# Patient Record
Sex: Female | Born: 1935
Health system: Southern US, Community
[De-identification: ages and names within clinical notes are randomized; demographics above are authoritative.]

## PROBLEM LIST (undated history)

## (undated) DIAGNOSIS — I4891 Unspecified atrial fibrillation: Secondary | ICD-10-CM

## (undated) DIAGNOSIS — F0390 Unspecified dementia without behavioral disturbance: Secondary | ICD-10-CM

## (undated) DIAGNOSIS — I639 Cerebral infarction, unspecified: Secondary | ICD-10-CM

## (undated) DIAGNOSIS — I10 Essential (primary) hypertension: Secondary | ICD-10-CM

## (undated) DIAGNOSIS — C801 Malignant (primary) neoplasm, unspecified: Secondary | ICD-10-CM

## (undated) DIAGNOSIS — E119 Type 2 diabetes mellitus without complications: Secondary | ICD-10-CM

## (undated) DIAGNOSIS — E785 Hyperlipidemia, unspecified: Secondary | ICD-10-CM

## (undated) HISTORY — DX: Malignant (primary) neoplasm, unspecified: C80.1

## (undated) HISTORY — PX: KNEE SURGERY: SHX244

## (undated) HISTORY — PX: BLADDER SURGERY: SHX569

## (undated) HISTORY — DX: Cerebral infarction, unspecified: I63.9

## (undated) HISTORY — DX: Type 2 diabetes mellitus without complications: E11.9

## (undated) HISTORY — PX: CATARACT EXTRACTION: SUR2

## (undated) HISTORY — DX: Unspecified dementia, unspecified severity, without behavioral disturbance, psychotic disturbance, mood disturbance, and anxiety: F03.90

## (undated) HISTORY — PX: OOPHORECTOMY: SHX86

## (undated) HISTORY — PX: SHOULDER SURGERY: SHX246

## (undated) HISTORY — DX: Unspecified atrial fibrillation: I48.91

## (undated) HISTORY — PX: ABDOMINAL HYSTERECTOMY: SHX81

## (undated) HISTORY — PX: ILEOCECETOMY: SHX5857

## (undated) HISTORY — DX: Hyperlipidemia, unspecified: E78.5

## (undated) HISTORY — PX: WRIST SURGERY: SHX841

## (undated) HISTORY — DX: Essential (primary) hypertension: I10

---

## 2006-05-17 ENCOUNTER — Encounter: Admission: RE | Admit: 2006-05-17 | Discharge: 2006-05-17 | Payer: Self-pay | Admitting: Family Medicine

## 2011-04-30 DIAGNOSIS — H532 Diplopia: Secondary | ICD-10-CM | POA: Diagnosis not present

## 2011-05-01 DIAGNOSIS — I079 Rheumatic tricuspid valve disease, unspecified: Secondary | ICD-10-CM | POA: Diagnosis not present

## 2011-05-01 DIAGNOSIS — I059 Rheumatic mitral valve disease, unspecified: Secondary | ICD-10-CM | POA: Diagnosis not present

## 2011-05-01 DIAGNOSIS — I6529 Occlusion and stenosis of unspecified carotid artery: Secondary | ICD-10-CM | POA: Diagnosis not present

## 2011-05-01 DIAGNOSIS — I658 Occlusion and stenosis of other precerebral arteries: Secondary | ICD-10-CM | POA: Diagnosis not present

## 2011-05-01 DIAGNOSIS — I359 Nonrheumatic aortic valve disorder, unspecified: Secondary | ICD-10-CM | POA: Diagnosis not present

## 2011-05-01 DIAGNOSIS — I4891 Unspecified atrial fibrillation: Secondary | ICD-10-CM | POA: Diagnosis not present

## 2011-05-01 DIAGNOSIS — I6789 Other cerebrovascular disease: Secondary | ICD-10-CM | POA: Diagnosis not present

## 2011-05-04 DIAGNOSIS — H532 Diplopia: Secondary | ICD-10-CM | POA: Diagnosis not present

## 2011-05-07 DIAGNOSIS — I6789 Other cerebrovascular disease: Secondary | ICD-10-CM | POA: Diagnosis not present

## 2011-05-07 DIAGNOSIS — H532 Diplopia: Secondary | ICD-10-CM | POA: Diagnosis not present

## 2011-05-18 DIAGNOSIS — Z932 Ileostomy status: Secondary | ICD-10-CM | POA: Diagnosis not present

## 2011-05-18 DIAGNOSIS — G8190 Hemiplegia, unspecified affecting unspecified side: Secondary | ICD-10-CM | POA: Diagnosis not present

## 2011-05-18 DIAGNOSIS — E785 Hyperlipidemia, unspecified: Secondary | ICD-10-CM | POA: Diagnosis present

## 2011-05-18 DIAGNOSIS — Z7982 Long term (current) use of aspirin: Secondary | ICD-10-CM | POA: Diagnosis not present

## 2011-05-18 DIAGNOSIS — R4182 Altered mental status, unspecified: Secondary | ICD-10-CM | POA: Diagnosis not present

## 2011-05-18 DIAGNOSIS — R7301 Impaired fasting glucose: Secondary | ICD-10-CM | POA: Diagnosis not present

## 2011-05-18 DIAGNOSIS — I69991 Dysphagia following unspecified cerebrovascular disease: Secondary | ICD-10-CM | POA: Diagnosis not present

## 2011-05-18 DIAGNOSIS — R471 Dysarthria and anarthria: Secondary | ICD-10-CM | POA: Diagnosis present

## 2011-05-18 DIAGNOSIS — Z794 Long term (current) use of insulin: Secondary | ICD-10-CM | POA: Diagnosis not present

## 2011-05-18 DIAGNOSIS — E860 Dehydration: Secondary | ICD-10-CM | POA: Diagnosis not present

## 2011-05-18 DIAGNOSIS — K219 Gastro-esophageal reflux disease without esophagitis: Secondary | ICD-10-CM | POA: Diagnosis present

## 2011-05-18 DIAGNOSIS — F329 Major depressive disorder, single episode, unspecified: Secondary | ICD-10-CM | POA: Diagnosis present

## 2011-05-18 DIAGNOSIS — E1149 Type 2 diabetes mellitus with other diabetic neurological complication: Secondary | ICD-10-CM | POA: Diagnosis not present

## 2011-05-18 DIAGNOSIS — I6340 Cerebral infarction due to embolism of unspecified cerebral artery: Secondary | ICD-10-CM | POA: Diagnosis not present

## 2011-05-18 DIAGNOSIS — N390 Urinary tract infection, site not specified: Secondary | ICD-10-CM | POA: Diagnosis not present

## 2011-05-18 DIAGNOSIS — I6350 Cerebral infarction due to unspecified occlusion or stenosis of unspecified cerebral artery: Secondary | ICD-10-CM | POA: Diagnosis not present

## 2011-05-18 DIAGNOSIS — K5190 Ulcerative colitis, unspecified, without complications: Secondary | ICD-10-CM | POA: Diagnosis not present

## 2011-05-18 DIAGNOSIS — I369 Nonrheumatic tricuspid valve disorder, unspecified: Secondary | ICD-10-CM | POA: Diagnosis not present

## 2011-05-18 DIAGNOSIS — R627 Adult failure to thrive: Secondary | ICD-10-CM | POA: Diagnosis not present

## 2011-05-18 DIAGNOSIS — Z8673 Personal history of transient ischemic attack (TIA), and cerebral infarction without residual deficits: Secondary | ICD-10-CM | POA: Diagnosis not present

## 2011-05-18 DIAGNOSIS — R1310 Dysphagia, unspecified: Secondary | ICD-10-CM | POA: Diagnosis not present

## 2011-05-18 DIAGNOSIS — R5381 Other malaise: Secondary | ICD-10-CM | POA: Diagnosis not present

## 2011-05-18 DIAGNOSIS — I69993 Ataxia following unspecified cerebrovascular disease: Secondary | ICD-10-CM | POA: Diagnosis not present

## 2011-05-18 DIAGNOSIS — G459 Transient cerebral ischemic attack, unspecified: Secondary | ICD-10-CM | POA: Diagnosis not present

## 2011-05-18 DIAGNOSIS — R1312 Dysphagia, oropharyngeal phase: Secondary | ICD-10-CM | POA: Diagnosis not present

## 2011-05-18 DIAGNOSIS — R633 Feeding difficulties, unspecified: Secondary | ICD-10-CM | POA: Diagnosis not present

## 2011-05-18 DIAGNOSIS — F3289 Other specified depressive episodes: Secondary | ICD-10-CM | POA: Diagnosis not present

## 2011-05-18 DIAGNOSIS — I4891 Unspecified atrial fibrillation: Secondary | ICD-10-CM | POA: Diagnosis not present

## 2011-05-18 DIAGNOSIS — I69922 Dysarthria following unspecified cerebrovascular disease: Secondary | ICD-10-CM | POA: Diagnosis not present

## 2011-05-18 DIAGNOSIS — R279 Unspecified lack of coordination: Secondary | ICD-10-CM | POA: Diagnosis present

## 2011-05-18 DIAGNOSIS — I10 Essential (primary) hypertension: Secondary | ICD-10-CM | POA: Diagnosis not present

## 2011-05-18 DIAGNOSIS — E119 Type 2 diabetes mellitus without complications: Secondary | ICD-10-CM | POA: Diagnosis not present

## 2011-05-18 DIAGNOSIS — R269 Unspecified abnormalities of gait and mobility: Secondary | ICD-10-CM | POA: Diagnosis not present

## 2011-05-18 DIAGNOSIS — IMO0001 Reserved for inherently not codable concepts without codable children: Secondary | ICD-10-CM | POA: Diagnosis not present

## 2011-05-18 DIAGNOSIS — E1159 Type 2 diabetes mellitus with other circulatory complications: Secondary | ICD-10-CM | POA: Diagnosis not present

## 2011-05-27 DIAGNOSIS — I4891 Unspecified atrial fibrillation: Secondary | ICD-10-CM | POA: Diagnosis not present

## 2011-05-27 DIAGNOSIS — I6340 Cerebral infarction due to embolism of unspecified cerebral artery: Secondary | ICD-10-CM | POA: Diagnosis not present

## 2011-05-27 DIAGNOSIS — R269 Unspecified abnormalities of gait and mobility: Secondary | ICD-10-CM | POA: Diagnosis not present

## 2011-05-27 DIAGNOSIS — R1310 Dysphagia, unspecified: Secondary | ICD-10-CM | POA: Diagnosis not present

## 2011-05-27 DIAGNOSIS — G8190 Hemiplegia, unspecified affecting unspecified side: Secondary | ICD-10-CM | POA: Diagnosis not present

## 2011-05-27 DIAGNOSIS — R5381 Other malaise: Secondary | ICD-10-CM | POA: Diagnosis not present

## 2011-05-27 DIAGNOSIS — R279 Unspecified lack of coordination: Secondary | ICD-10-CM | POA: Diagnosis not present

## 2011-05-27 DIAGNOSIS — I69922 Dysarthria following unspecified cerebrovascular disease: Secondary | ICD-10-CM | POA: Diagnosis not present

## 2011-05-27 DIAGNOSIS — K5190 Ulcerative colitis, unspecified, without complications: Secondary | ICD-10-CM | POA: Diagnosis not present

## 2011-05-27 DIAGNOSIS — E119 Type 2 diabetes mellitus without complications: Secondary | ICD-10-CM | POA: Diagnosis not present

## 2011-05-27 DIAGNOSIS — E785 Hyperlipidemia, unspecified: Secondary | ICD-10-CM | POA: Diagnosis not present

## 2011-05-27 DIAGNOSIS — I6350 Cerebral infarction due to unspecified occlusion or stenosis of unspecified cerebral artery: Secondary | ICD-10-CM | POA: Diagnosis not present

## 2011-05-27 DIAGNOSIS — G459 Transient cerebral ischemic attack, unspecified: Secondary | ICD-10-CM | POA: Diagnosis not present

## 2011-05-27 DIAGNOSIS — E1159 Type 2 diabetes mellitus with other circulatory complications: Secondary | ICD-10-CM | POA: Diagnosis not present

## 2011-05-27 DIAGNOSIS — K219 Gastro-esophageal reflux disease without esophagitis: Secondary | ICD-10-CM | POA: Diagnosis not present

## 2011-05-27 DIAGNOSIS — I69993 Ataxia following unspecified cerebrovascular disease: Secondary | ICD-10-CM | POA: Diagnosis not present

## 2011-05-27 DIAGNOSIS — I69991 Dysphagia following unspecified cerebrovascular disease: Secondary | ICD-10-CM | POA: Diagnosis not present

## 2011-05-29 DIAGNOSIS — E1159 Type 2 diabetes mellitus with other circulatory complications: Secondary | ICD-10-CM | POA: Diagnosis not present

## 2011-05-29 DIAGNOSIS — I4891 Unspecified atrial fibrillation: Secondary | ICD-10-CM | POA: Diagnosis not present

## 2011-05-29 DIAGNOSIS — I6340 Cerebral infarction due to embolism of unspecified cerebral artery: Secondary | ICD-10-CM | POA: Diagnosis not present

## 2011-05-29 DIAGNOSIS — E785 Hyperlipidemia, unspecified: Secondary | ICD-10-CM | POA: Diagnosis not present

## 2011-07-09 DIAGNOSIS — R279 Unspecified lack of coordination: Secondary | ICD-10-CM | POA: Diagnosis not present

## 2011-07-09 DIAGNOSIS — Z8673 Personal history of transient ischemic attack (TIA), and cerebral infarction without residual deficits: Secondary | ICD-10-CM | POA: Diagnosis not present

## 2011-07-09 DIAGNOSIS — E119 Type 2 diabetes mellitus without complications: Secondary | ICD-10-CM | POA: Diagnosis not present

## 2011-07-09 DIAGNOSIS — Z932 Ileostomy status: Secondary | ICD-10-CM | POA: Diagnosis not present

## 2011-07-09 DIAGNOSIS — I4891 Unspecified atrial fibrillation: Secondary | ICD-10-CM | POA: Diagnosis not present

## 2011-07-09 DIAGNOSIS — I69959 Hemiplegia and hemiparesis following unspecified cerebrovascular disease affecting unspecified side: Secondary | ICD-10-CM | POA: Diagnosis not present

## 2011-07-13 DIAGNOSIS — R633 Feeding difficulties, unspecified: Secondary | ICD-10-CM | POA: Diagnosis not present

## 2011-07-14 DIAGNOSIS — Z8673 Personal history of transient ischemic attack (TIA), and cerebral infarction without residual deficits: Secondary | ICD-10-CM | POA: Diagnosis not present

## 2011-07-14 DIAGNOSIS — E119 Type 2 diabetes mellitus without complications: Secondary | ICD-10-CM | POA: Diagnosis not present

## 2011-07-14 DIAGNOSIS — Z932 Ileostomy status: Secondary | ICD-10-CM | POA: Diagnosis not present

## 2011-07-14 DIAGNOSIS — R279 Unspecified lack of coordination: Secondary | ICD-10-CM | POA: Diagnosis not present

## 2011-07-14 DIAGNOSIS — I4891 Unspecified atrial fibrillation: Secondary | ICD-10-CM | POA: Diagnosis not present

## 2011-07-14 DIAGNOSIS — I69959 Hemiplegia and hemiparesis following unspecified cerebrovascular disease affecting unspecified side: Secondary | ICD-10-CM | POA: Diagnosis not present

## 2011-07-15 DIAGNOSIS — E119 Type 2 diabetes mellitus without complications: Secondary | ICD-10-CM | POA: Diagnosis not present

## 2011-07-15 DIAGNOSIS — I69959 Hemiplegia and hemiparesis following unspecified cerebrovascular disease affecting unspecified side: Secondary | ICD-10-CM | POA: Diagnosis not present

## 2011-07-15 DIAGNOSIS — Z8673 Personal history of transient ischemic attack (TIA), and cerebral infarction without residual deficits: Secondary | ICD-10-CM | POA: Diagnosis not present

## 2011-07-15 DIAGNOSIS — Z932 Ileostomy status: Secondary | ICD-10-CM | POA: Diagnosis not present

## 2011-07-15 DIAGNOSIS — I4891 Unspecified atrial fibrillation: Secondary | ICD-10-CM | POA: Diagnosis not present

## 2011-07-15 DIAGNOSIS — R279 Unspecified lack of coordination: Secondary | ICD-10-CM | POA: Diagnosis not present

## 2011-07-20 DIAGNOSIS — I6350 Cerebral infarction due to unspecified occlusion or stenosis of unspecified cerebral artery: Secondary | ICD-10-CM | POA: Diagnosis not present

## 2011-07-20 DIAGNOSIS — Z79899 Other long term (current) drug therapy: Secondary | ICD-10-CM | POA: Diagnosis not present

## 2011-07-20 DIAGNOSIS — I10 Essential (primary) hypertension: Secondary | ICD-10-CM | POA: Diagnosis not present

## 2011-07-20 DIAGNOSIS — I4891 Unspecified atrial fibrillation: Secondary | ICD-10-CM | POA: Diagnosis not present

## 2011-07-20 DIAGNOSIS — E119 Type 2 diabetes mellitus without complications: Secondary | ICD-10-CM | POA: Diagnosis not present

## 2011-07-21 DIAGNOSIS — I69959 Hemiplegia and hemiparesis following unspecified cerebrovascular disease affecting unspecified side: Secondary | ICD-10-CM | POA: Diagnosis not present

## 2011-07-21 DIAGNOSIS — Z932 Ileostomy status: Secondary | ICD-10-CM | POA: Diagnosis not present

## 2011-07-21 DIAGNOSIS — R279 Unspecified lack of coordination: Secondary | ICD-10-CM | POA: Diagnosis not present

## 2011-07-21 DIAGNOSIS — Z8673 Personal history of transient ischemic attack (TIA), and cerebral infarction without residual deficits: Secondary | ICD-10-CM | POA: Diagnosis not present

## 2011-07-21 DIAGNOSIS — E119 Type 2 diabetes mellitus without complications: Secondary | ICD-10-CM | POA: Diagnosis not present

## 2011-07-21 DIAGNOSIS — I4891 Unspecified atrial fibrillation: Secondary | ICD-10-CM | POA: Diagnosis not present

## 2011-07-22 DIAGNOSIS — I4891 Unspecified atrial fibrillation: Secondary | ICD-10-CM | POA: Diagnosis not present

## 2011-07-22 DIAGNOSIS — E119 Type 2 diabetes mellitus without complications: Secondary | ICD-10-CM | POA: Diagnosis not present

## 2011-07-22 DIAGNOSIS — I69959 Hemiplegia and hemiparesis following unspecified cerebrovascular disease affecting unspecified side: Secondary | ICD-10-CM | POA: Diagnosis not present

## 2011-07-22 DIAGNOSIS — Z932 Ileostomy status: Secondary | ICD-10-CM | POA: Diagnosis not present

## 2011-07-22 DIAGNOSIS — R279 Unspecified lack of coordination: Secondary | ICD-10-CM | POA: Diagnosis not present

## 2011-07-22 DIAGNOSIS — Z8673 Personal history of transient ischemic attack (TIA), and cerebral infarction without residual deficits: Secondary | ICD-10-CM | POA: Diagnosis not present

## 2011-07-23 DIAGNOSIS — E119 Type 2 diabetes mellitus without complications: Secondary | ICD-10-CM | POA: Diagnosis not present

## 2011-07-23 DIAGNOSIS — Z8673 Personal history of transient ischemic attack (TIA), and cerebral infarction without residual deficits: Secondary | ICD-10-CM | POA: Diagnosis not present

## 2011-07-23 DIAGNOSIS — I69959 Hemiplegia and hemiparesis following unspecified cerebrovascular disease affecting unspecified side: Secondary | ICD-10-CM | POA: Diagnosis not present

## 2011-07-23 DIAGNOSIS — I4891 Unspecified atrial fibrillation: Secondary | ICD-10-CM | POA: Diagnosis not present

## 2011-07-23 DIAGNOSIS — Z932 Ileostomy status: Secondary | ICD-10-CM | POA: Diagnosis not present

## 2011-07-23 DIAGNOSIS — R279 Unspecified lack of coordination: Secondary | ICD-10-CM | POA: Diagnosis not present

## 2011-07-27 DIAGNOSIS — R279 Unspecified lack of coordination: Secondary | ICD-10-CM | POA: Diagnosis not present

## 2011-07-27 DIAGNOSIS — E119 Type 2 diabetes mellitus without complications: Secondary | ICD-10-CM | POA: Diagnosis not present

## 2011-07-27 DIAGNOSIS — Z8673 Personal history of transient ischemic attack (TIA), and cerebral infarction without residual deficits: Secondary | ICD-10-CM | POA: Diagnosis not present

## 2011-07-27 DIAGNOSIS — I4891 Unspecified atrial fibrillation: Secondary | ICD-10-CM | POA: Diagnosis not present

## 2011-07-27 DIAGNOSIS — Z932 Ileostomy status: Secondary | ICD-10-CM | POA: Diagnosis not present

## 2011-07-27 DIAGNOSIS — I69959 Hemiplegia and hemiparesis following unspecified cerebrovascular disease affecting unspecified side: Secondary | ICD-10-CM | POA: Diagnosis not present

## 2011-07-29 DIAGNOSIS — Z932 Ileostomy status: Secondary | ICD-10-CM | POA: Diagnosis not present

## 2011-07-29 DIAGNOSIS — Z8673 Personal history of transient ischemic attack (TIA), and cerebral infarction without residual deficits: Secondary | ICD-10-CM | POA: Diagnosis not present

## 2011-07-29 DIAGNOSIS — E119 Type 2 diabetes mellitus without complications: Secondary | ICD-10-CM | POA: Diagnosis not present

## 2011-07-29 DIAGNOSIS — I4891 Unspecified atrial fibrillation: Secondary | ICD-10-CM | POA: Diagnosis not present

## 2011-07-29 DIAGNOSIS — I69959 Hemiplegia and hemiparesis following unspecified cerebrovascular disease affecting unspecified side: Secondary | ICD-10-CM | POA: Diagnosis not present

## 2011-07-29 DIAGNOSIS — R279 Unspecified lack of coordination: Secondary | ICD-10-CM | POA: Diagnosis not present

## 2011-07-31 DIAGNOSIS — Z932 Ileostomy status: Secondary | ICD-10-CM | POA: Diagnosis not present

## 2011-07-31 DIAGNOSIS — I4891 Unspecified atrial fibrillation: Secondary | ICD-10-CM | POA: Diagnosis not present

## 2011-07-31 DIAGNOSIS — E119 Type 2 diabetes mellitus without complications: Secondary | ICD-10-CM | POA: Diagnosis not present

## 2011-07-31 DIAGNOSIS — R279 Unspecified lack of coordination: Secondary | ICD-10-CM | POA: Diagnosis not present

## 2011-07-31 DIAGNOSIS — I69959 Hemiplegia and hemiparesis following unspecified cerebrovascular disease affecting unspecified side: Secondary | ICD-10-CM | POA: Diagnosis not present

## 2011-07-31 DIAGNOSIS — Z8673 Personal history of transient ischemic attack (TIA), and cerebral infarction without residual deficits: Secondary | ICD-10-CM | POA: Diagnosis not present

## 2011-08-05 DIAGNOSIS — Z8673 Personal history of transient ischemic attack (TIA), and cerebral infarction without residual deficits: Secondary | ICD-10-CM | POA: Diagnosis not present

## 2011-08-05 DIAGNOSIS — I69959 Hemiplegia and hemiparesis following unspecified cerebrovascular disease affecting unspecified side: Secondary | ICD-10-CM | POA: Diagnosis not present

## 2011-08-05 DIAGNOSIS — I4891 Unspecified atrial fibrillation: Secondary | ICD-10-CM | POA: Diagnosis not present

## 2011-08-05 DIAGNOSIS — R279 Unspecified lack of coordination: Secondary | ICD-10-CM | POA: Diagnosis not present

## 2011-08-05 DIAGNOSIS — Z932 Ileostomy status: Secondary | ICD-10-CM | POA: Diagnosis not present

## 2011-08-05 DIAGNOSIS — E119 Type 2 diabetes mellitus without complications: Secondary | ICD-10-CM | POA: Diagnosis not present

## 2011-08-13 DIAGNOSIS — H532 Diplopia: Secondary | ICD-10-CM | POA: Diagnosis not present

## 2011-10-01 DIAGNOSIS — H6120 Impacted cerumen, unspecified ear: Secondary | ICD-10-CM | POA: Diagnosis not present

## 2011-10-01 DIAGNOSIS — B370 Candidal stomatitis: Secondary | ICD-10-CM | POA: Diagnosis not present

## 2011-11-04 DIAGNOSIS — E119 Type 2 diabetes mellitus without complications: Secondary | ICD-10-CM | POA: Diagnosis not present

## 2011-11-04 DIAGNOSIS — S6390XA Sprain of unspecified part of unspecified wrist and hand, initial encounter: Secondary | ICD-10-CM | POA: Diagnosis not present

## 2011-11-04 DIAGNOSIS — S63509A Unspecified sprain of unspecified wrist, initial encounter: Secondary | ICD-10-CM | POA: Diagnosis not present

## 2011-11-04 DIAGNOSIS — M159 Polyosteoarthritis, unspecified: Secondary | ICD-10-CM | POA: Diagnosis not present

## 2011-11-23 DIAGNOSIS — M19049 Primary osteoarthritis, unspecified hand: Secondary | ICD-10-CM | POA: Diagnosis not present

## 2011-11-23 DIAGNOSIS — I4891 Unspecified atrial fibrillation: Secondary | ICD-10-CM | POA: Diagnosis not present

## 2011-11-23 DIAGNOSIS — IMO0001 Reserved for inherently not codable concepts without codable children: Secondary | ICD-10-CM | POA: Diagnosis not present

## 2011-11-23 DIAGNOSIS — Z79899 Other long term (current) drug therapy: Secondary | ICD-10-CM | POA: Diagnosis not present

## 2011-11-23 DIAGNOSIS — I10 Essential (primary) hypertension: Secondary | ICD-10-CM | POA: Diagnosis not present

## 2012-02-01 DIAGNOSIS — K2970 Gastritis, unspecified, without bleeding: Secondary | ICD-10-CM | POA: Diagnosis not present

## 2012-02-01 DIAGNOSIS — Z23 Encounter for immunization: Secondary | ICD-10-CM | POA: Diagnosis not present

## 2012-02-01 DIAGNOSIS — K2990 Gastroduodenitis, unspecified, without bleeding: Secondary | ICD-10-CM | POA: Diagnosis not present

## 2012-02-01 DIAGNOSIS — K219 Gastro-esophageal reflux disease without esophagitis: Secondary | ICD-10-CM | POA: Diagnosis not present

## 2012-02-16 DIAGNOSIS — H524 Presbyopia: Secondary | ICD-10-CM | POA: Diagnosis not present

## 2012-02-16 DIAGNOSIS — H532 Diplopia: Secondary | ICD-10-CM | POA: Diagnosis not present

## 2012-05-10 DIAGNOSIS — E1149 Type 2 diabetes mellitus with other diabetic neurological complication: Secondary | ICD-10-CM | POA: Diagnosis not present

## 2012-05-12 DIAGNOSIS — I10 Essential (primary) hypertension: Secondary | ICD-10-CM | POA: Diagnosis not present

## 2012-05-12 DIAGNOSIS — N183 Chronic kidney disease, stage 3 unspecified: Secondary | ICD-10-CM | POA: Diagnosis not present

## 2012-05-12 DIAGNOSIS — E7800 Pure hypercholesterolemia, unspecified: Secondary | ICD-10-CM | POA: Diagnosis not present

## 2012-05-12 DIAGNOSIS — IMO0001 Reserved for inherently not codable concepts without codable children: Secondary | ICD-10-CM | POA: Diagnosis not present

## 2012-05-20 DIAGNOSIS — Z1231 Encounter for screening mammogram for malignant neoplasm of breast: Secondary | ICD-10-CM | POA: Diagnosis not present

## 2012-09-05 DIAGNOSIS — E1149 Type 2 diabetes mellitus with other diabetic neurological complication: Secondary | ICD-10-CM | POA: Diagnosis not present

## 2012-09-05 DIAGNOSIS — E1129 Type 2 diabetes mellitus with other diabetic kidney complication: Secondary | ICD-10-CM | POA: Diagnosis not present

## 2012-09-05 DIAGNOSIS — Z79899 Other long term (current) drug therapy: Secondary | ICD-10-CM | POA: Diagnosis not present

## 2012-09-05 DIAGNOSIS — E1142 Type 2 diabetes mellitus with diabetic polyneuropathy: Secondary | ICD-10-CM | POA: Diagnosis not present

## 2012-09-05 DIAGNOSIS — IMO0001 Reserved for inherently not codable concepts without codable children: Secondary | ICD-10-CM | POA: Diagnosis not present

## 2012-09-13 DIAGNOSIS — E1129 Type 2 diabetes mellitus with other diabetic kidney complication: Secondary | ICD-10-CM | POA: Diagnosis not present

## 2012-09-13 DIAGNOSIS — E1142 Type 2 diabetes mellitus with diabetic polyneuropathy: Secondary | ICD-10-CM | POA: Diagnosis not present

## 2012-09-13 DIAGNOSIS — E1149 Type 2 diabetes mellitus with other diabetic neurological complication: Secondary | ICD-10-CM | POA: Diagnosis not present

## 2012-09-13 DIAGNOSIS — IMO0001 Reserved for inherently not codable concepts without codable children: Secondary | ICD-10-CM | POA: Diagnosis not present

## 2012-10-18 DIAGNOSIS — E1129 Type 2 diabetes mellitus with other diabetic kidney complication: Secondary | ICD-10-CM | POA: Diagnosis not present

## 2012-10-18 DIAGNOSIS — IMO0001 Reserved for inherently not codable concepts without codable children: Secondary | ICD-10-CM | POA: Diagnosis not present

## 2012-10-18 DIAGNOSIS — N189 Chronic kidney disease, unspecified: Secondary | ICD-10-CM | POA: Diagnosis not present

## 2012-10-19 DIAGNOSIS — Z006 Encounter for examination for normal comparison and control in clinical research program: Secondary | ICD-10-CM | POA: Diagnosis not present

## 2012-10-19 DIAGNOSIS — E7800 Pure hypercholesterolemia, unspecified: Secondary | ICD-10-CM | POA: Diagnosis not present

## 2012-12-07 DIAGNOSIS — Z961 Presence of intraocular lens: Secondary | ICD-10-CM | POA: Diagnosis not present

## 2013-01-13 DIAGNOSIS — Z23 Encounter for immunization: Secondary | ICD-10-CM | POA: Diagnosis not present

## 2013-01-25 DIAGNOSIS — L821 Other seborrheic keratosis: Secondary | ICD-10-CM | POA: Diagnosis not present

## 2013-01-25 DIAGNOSIS — I789 Disease of capillaries, unspecified: Secondary | ICD-10-CM | POA: Diagnosis not present

## 2013-01-25 DIAGNOSIS — B078 Other viral warts: Secondary | ICD-10-CM | POA: Diagnosis not present

## 2013-01-25 DIAGNOSIS — B351 Tinea unguium: Secondary | ICD-10-CM | POA: Diagnosis not present

## 2013-01-25 DIAGNOSIS — L819 Disorder of pigmentation, unspecified: Secondary | ICD-10-CM | POA: Diagnosis not present

## 2013-04-04 DIAGNOSIS — M79609 Pain in unspecified limb: Secondary | ICD-10-CM | POA: Diagnosis not present

## 2013-04-04 DIAGNOSIS — E1129 Type 2 diabetes mellitus with other diabetic kidney complication: Secondary | ICD-10-CM | POA: Diagnosis not present

## 2013-04-04 DIAGNOSIS — N189 Chronic kidney disease, unspecified: Secondary | ICD-10-CM | POA: Diagnosis not present

## 2013-04-04 DIAGNOSIS — E7800 Pure hypercholesterolemia, unspecified: Secondary | ICD-10-CM | POA: Diagnosis not present

## 2013-04-04 DIAGNOSIS — M25579 Pain in unspecified ankle and joints of unspecified foot: Secondary | ICD-10-CM | POA: Diagnosis not present

## 2013-04-04 DIAGNOSIS — S8990XA Unspecified injury of unspecified lower leg, initial encounter: Secondary | ICD-10-CM | POA: Diagnosis not present

## 2013-04-04 DIAGNOSIS — N183 Chronic kidney disease, stage 3 unspecified: Secondary | ICD-10-CM | POA: Diagnosis not present

## 2013-04-04 DIAGNOSIS — IMO0001 Reserved for inherently not codable concepts without codable children: Secondary | ICD-10-CM | POA: Diagnosis not present

## 2013-04-07 DIAGNOSIS — E1142 Type 2 diabetes mellitus with diabetic polyneuropathy: Secondary | ICD-10-CM | POA: Diagnosis not present

## 2013-04-07 DIAGNOSIS — E1149 Type 2 diabetes mellitus with other diabetic neurological complication: Secondary | ICD-10-CM | POA: Diagnosis not present

## 2013-04-07 DIAGNOSIS — G609 Hereditary and idiopathic neuropathy, unspecified: Secondary | ICD-10-CM | POA: Diagnosis not present

## 2013-04-07 DIAGNOSIS — M79609 Pain in unspecified limb: Secondary | ICD-10-CM | POA: Diagnosis not present

## 2013-04-11 DIAGNOSIS — S0990XA Unspecified injury of head, initial encounter: Secondary | ICD-10-CM | POA: Diagnosis not present

## 2013-04-11 DIAGNOSIS — R4182 Altered mental status, unspecified: Secondary | ICD-10-CM | POA: Diagnosis not present

## 2013-04-11 DIAGNOSIS — I4891 Unspecified atrial fibrillation: Secondary | ICD-10-CM | POA: Diagnosis not present

## 2013-04-11 DIAGNOSIS — M25579 Pain in unspecified ankle and joints of unspecified foot: Secondary | ICD-10-CM | POA: Diagnosis not present

## 2013-04-11 DIAGNOSIS — I498 Other specified cardiac arrhythmias: Secondary | ICD-10-CM | POA: Diagnosis not present

## 2013-04-11 DIAGNOSIS — F0390 Unspecified dementia without behavioral disturbance: Secondary | ICD-10-CM | POA: Diagnosis not present

## 2013-04-11 DIAGNOSIS — L02419 Cutaneous abscess of limb, unspecified: Secondary | ICD-10-CM | POA: Diagnosis not present

## 2013-04-17 DIAGNOSIS — E119 Type 2 diabetes mellitus without complications: Secondary | ICD-10-CM | POA: Diagnosis not present

## 2013-04-17 DIAGNOSIS — L02818 Cutaneous abscess of other sites: Secondary | ICD-10-CM | POA: Diagnosis not present

## 2013-04-17 DIAGNOSIS — R4182 Altered mental status, unspecified: Secondary | ICD-10-CM | POA: Diagnosis not present

## 2013-04-17 DIAGNOSIS — I4891 Unspecified atrial fibrillation: Secondary | ICD-10-CM | POA: Diagnosis not present

## 2013-04-17 DIAGNOSIS — Z932 Ileostomy status: Secondary | ICD-10-CM | POA: Diagnosis not present

## 2013-04-18 DIAGNOSIS — M79609 Pain in unspecified limb: Secondary | ICD-10-CM | POA: Diagnosis not present

## 2013-04-18 DIAGNOSIS — S8990XA Unspecified injury of unspecified lower leg, initial encounter: Secondary | ICD-10-CM | POA: Diagnosis not present

## 2013-04-18 DIAGNOSIS — L02619 Cutaneous abscess of unspecified foot: Secondary | ICD-10-CM | POA: Diagnosis not present

## 2013-04-18 DIAGNOSIS — M25579 Pain in unspecified ankle and joints of unspecified foot: Secondary | ICD-10-CM | POA: Diagnosis not present

## 2013-04-19 DIAGNOSIS — S8990XA Unspecified injury of unspecified lower leg, initial encounter: Secondary | ICD-10-CM | POA: Diagnosis not present

## 2013-04-19 DIAGNOSIS — M79609 Pain in unspecified limb: Secondary | ICD-10-CM | POA: Diagnosis not present

## 2013-04-19 DIAGNOSIS — L02619 Cutaneous abscess of unspecified foot: Secondary | ICD-10-CM | POA: Diagnosis not present

## 2013-04-19 DIAGNOSIS — R609 Edema, unspecified: Secondary | ICD-10-CM | POA: Diagnosis not present

## 2013-04-19 DIAGNOSIS — IMO0001 Reserved for inherently not codable concepts without codable children: Secondary | ICD-10-CM | POA: Diagnosis not present

## 2013-04-19 DIAGNOSIS — M25579 Pain in unspecified ankle and joints of unspecified foot: Secondary | ICD-10-CM | POA: Diagnosis not present

## 2013-04-20 DIAGNOSIS — L02818 Cutaneous abscess of other sites: Secondary | ICD-10-CM | POA: Diagnosis not present

## 2013-04-20 DIAGNOSIS — R4182 Altered mental status, unspecified: Secondary | ICD-10-CM | POA: Diagnosis not present

## 2013-04-20 DIAGNOSIS — Z932 Ileostomy status: Secondary | ICD-10-CM | POA: Diagnosis not present

## 2013-04-20 DIAGNOSIS — E119 Type 2 diabetes mellitus without complications: Secondary | ICD-10-CM | POA: Diagnosis not present

## 2013-04-20 DIAGNOSIS — I4891 Unspecified atrial fibrillation: Secondary | ICD-10-CM | POA: Diagnosis not present

## 2013-04-21 DIAGNOSIS — L02818 Cutaneous abscess of other sites: Secondary | ICD-10-CM | POA: Diagnosis not present

## 2013-04-21 DIAGNOSIS — L03818 Cellulitis of other sites: Secondary | ICD-10-CM | POA: Diagnosis not present

## 2013-04-21 DIAGNOSIS — R4182 Altered mental status, unspecified: Secondary | ICD-10-CM | POA: Diagnosis not present

## 2013-04-21 DIAGNOSIS — E119 Type 2 diabetes mellitus without complications: Secondary | ICD-10-CM | POA: Diagnosis not present

## 2013-04-21 DIAGNOSIS — I4891 Unspecified atrial fibrillation: Secondary | ICD-10-CM | POA: Diagnosis not present

## 2013-04-21 DIAGNOSIS — Z932 Ileostomy status: Secondary | ICD-10-CM | POA: Diagnosis not present

## 2013-04-25 DIAGNOSIS — E119 Type 2 diabetes mellitus without complications: Secondary | ICD-10-CM | POA: Diagnosis not present

## 2013-04-25 DIAGNOSIS — M25579 Pain in unspecified ankle and joints of unspecified foot: Secondary | ICD-10-CM | POA: Diagnosis not present

## 2013-04-25 DIAGNOSIS — L02818 Cutaneous abscess of other sites: Secondary | ICD-10-CM | POA: Diagnosis not present

## 2013-04-25 DIAGNOSIS — S93699A Other sprain of unspecified foot, initial encounter: Secondary | ICD-10-CM | POA: Diagnosis not present

## 2013-04-26 DIAGNOSIS — S93699A Other sprain of unspecified foot, initial encounter: Secondary | ICD-10-CM | POA: Diagnosis not present

## 2013-04-26 DIAGNOSIS — M25579 Pain in unspecified ankle and joints of unspecified foot: Secondary | ICD-10-CM | POA: Diagnosis not present

## 2013-05-01 DIAGNOSIS — L97409 Non-pressure chronic ulcer of unspecified heel and midfoot with unspecified severity: Secondary | ICD-10-CM | POA: Diagnosis not present

## 2013-05-04 DIAGNOSIS — L03119 Cellulitis of unspecified part of limb: Secondary | ICD-10-CM | POA: Diagnosis not present

## 2013-05-04 DIAGNOSIS — L02619 Cutaneous abscess of unspecified foot: Secondary | ICD-10-CM | POA: Diagnosis not present

## 2013-05-04 DIAGNOSIS — IMO0001 Reserved for inherently not codable concepts without codable children: Secondary | ICD-10-CM | POA: Diagnosis not present

## 2013-05-15 DIAGNOSIS — E119 Type 2 diabetes mellitus without complications: Secondary | ICD-10-CM | POA: Diagnosis not present

## 2013-05-15 DIAGNOSIS — L97409 Non-pressure chronic ulcer of unspecified heel and midfoot with unspecified severity: Secondary | ICD-10-CM | POA: Diagnosis not present

## 2013-05-15 DIAGNOSIS — L84 Corns and callosities: Secondary | ICD-10-CM | POA: Diagnosis not present

## 2013-05-17 DIAGNOSIS — M25579 Pain in unspecified ankle and joints of unspecified foot: Secondary | ICD-10-CM | POA: Diagnosis not present

## 2013-05-17 DIAGNOSIS — S93699A Other sprain of unspecified foot, initial encounter: Secondary | ICD-10-CM | POA: Diagnosis not present

## 2013-05-19 DIAGNOSIS — L03119 Cellulitis of unspecified part of limb: Secondary | ICD-10-CM | POA: Diagnosis not present

## 2013-05-19 DIAGNOSIS — L02419 Cutaneous abscess of limb, unspecified: Secondary | ICD-10-CM | POA: Diagnosis not present

## 2013-05-24 DIAGNOSIS — M25579 Pain in unspecified ankle and joints of unspecified foot: Secondary | ICD-10-CM | POA: Diagnosis not present

## 2013-05-24 DIAGNOSIS — S93699A Other sprain of unspecified foot, initial encounter: Secondary | ICD-10-CM | POA: Diagnosis not present

## 2013-05-26 DIAGNOSIS — M25579 Pain in unspecified ankle and joints of unspecified foot: Secondary | ICD-10-CM | POA: Diagnosis not present

## 2013-05-26 DIAGNOSIS — S93699A Other sprain of unspecified foot, initial encounter: Secondary | ICD-10-CM | POA: Diagnosis not present

## 2013-05-29 DIAGNOSIS — S93699A Other sprain of unspecified foot, initial encounter: Secondary | ICD-10-CM | POA: Diagnosis not present

## 2013-05-29 DIAGNOSIS — M25579 Pain in unspecified ankle and joints of unspecified foot: Secondary | ICD-10-CM | POA: Diagnosis not present

## 2013-05-31 DIAGNOSIS — M25579 Pain in unspecified ankle and joints of unspecified foot: Secondary | ICD-10-CM | POA: Diagnosis not present

## 2013-05-31 DIAGNOSIS — S93699A Other sprain of unspecified foot, initial encounter: Secondary | ICD-10-CM | POA: Diagnosis not present

## 2013-06-19 DIAGNOSIS — M25579 Pain in unspecified ankle and joints of unspecified foot: Secondary | ICD-10-CM | POA: Diagnosis not present

## 2013-06-19 DIAGNOSIS — S93699A Other sprain of unspecified foot, initial encounter: Secondary | ICD-10-CM | POA: Diagnosis not present

## 2013-06-21 DIAGNOSIS — M25579 Pain in unspecified ankle and joints of unspecified foot: Secondary | ICD-10-CM | POA: Diagnosis not present

## 2013-06-21 DIAGNOSIS — B070 Plantar wart: Secondary | ICD-10-CM | POA: Diagnosis not present

## 2013-06-21 DIAGNOSIS — S93699A Other sprain of unspecified foot, initial encounter: Secondary | ICD-10-CM | POA: Diagnosis not present

## 2013-06-27 DIAGNOSIS — M25579 Pain in unspecified ankle and joints of unspecified foot: Secondary | ICD-10-CM | POA: Diagnosis not present

## 2013-06-27 DIAGNOSIS — S93699A Other sprain of unspecified foot, initial encounter: Secondary | ICD-10-CM | POA: Diagnosis not present

## 2013-06-29 DIAGNOSIS — M25579 Pain in unspecified ankle and joints of unspecified foot: Secondary | ICD-10-CM | POA: Diagnosis not present

## 2013-06-29 DIAGNOSIS — S93699A Other sprain of unspecified foot, initial encounter: Secondary | ICD-10-CM | POA: Diagnosis not present

## 2013-07-04 DIAGNOSIS — S93699A Other sprain of unspecified foot, initial encounter: Secondary | ICD-10-CM | POA: Diagnosis not present

## 2013-07-04 DIAGNOSIS — M25579 Pain in unspecified ankle and joints of unspecified foot: Secondary | ICD-10-CM | POA: Diagnosis not present

## 2013-07-07 DIAGNOSIS — M25579 Pain in unspecified ankle and joints of unspecified foot: Secondary | ICD-10-CM | POA: Diagnosis not present

## 2013-07-07 DIAGNOSIS — S93699A Other sprain of unspecified foot, initial encounter: Secondary | ICD-10-CM | POA: Diagnosis not present

## 2013-07-26 DIAGNOSIS — M25579 Pain in unspecified ankle and joints of unspecified foot: Secondary | ICD-10-CM | POA: Diagnosis not present

## 2013-07-26 DIAGNOSIS — S93699A Other sprain of unspecified foot, initial encounter: Secondary | ICD-10-CM | POA: Diagnosis not present

## 2013-08-02 DIAGNOSIS — S93699A Other sprain of unspecified foot, initial encounter: Secondary | ICD-10-CM | POA: Diagnosis not present

## 2013-08-02 DIAGNOSIS — M25579 Pain in unspecified ankle and joints of unspecified foot: Secondary | ICD-10-CM | POA: Diagnosis not present

## 2013-08-09 DIAGNOSIS — N183 Chronic kidney disease, stage 3 unspecified: Secondary | ICD-10-CM | POA: Diagnosis not present

## 2013-08-09 DIAGNOSIS — E1129 Type 2 diabetes mellitus with other diabetic kidney complication: Secondary | ICD-10-CM | POA: Diagnosis not present

## 2013-08-09 DIAGNOSIS — E1165 Type 2 diabetes mellitus with hyperglycemia: Secondary | ICD-10-CM | POA: Diagnosis not present

## 2013-08-09 DIAGNOSIS — N63 Unspecified lump in unspecified breast: Secondary | ICD-10-CM | POA: Diagnosis not present

## 2013-08-10 DIAGNOSIS — M25579 Pain in unspecified ankle and joints of unspecified foot: Secondary | ICD-10-CM | POA: Diagnosis not present

## 2013-08-10 DIAGNOSIS — S93699A Other sprain of unspecified foot, initial encounter: Secondary | ICD-10-CM | POA: Diagnosis not present

## 2013-08-16 DIAGNOSIS — M25579 Pain in unspecified ankle and joints of unspecified foot: Secondary | ICD-10-CM | POA: Diagnosis not present

## 2013-08-16 DIAGNOSIS — S93699A Other sprain of unspecified foot, initial encounter: Secondary | ICD-10-CM | POA: Diagnosis not present

## 2013-08-16 DIAGNOSIS — N63 Unspecified lump in unspecified breast: Secondary | ICD-10-CM | POA: Diagnosis not present

## 2013-08-21 DIAGNOSIS — L97409 Non-pressure chronic ulcer of unspecified heel and midfoot with unspecified severity: Secondary | ICD-10-CM | POA: Diagnosis not present

## 2013-08-21 DIAGNOSIS — E119 Type 2 diabetes mellitus without complications: Secondary | ICD-10-CM | POA: Diagnosis not present

## 2013-08-21 DIAGNOSIS — L84 Corns and callosities: Secondary | ICD-10-CM | POA: Diagnosis not present

## 2013-08-24 DIAGNOSIS — C50419 Malignant neoplasm of upper-outer quadrant of unspecified female breast: Secondary | ICD-10-CM | POA: Diagnosis not present

## 2013-08-24 DIAGNOSIS — N63 Unspecified lump in unspecified breast: Secondary | ICD-10-CM | POA: Diagnosis not present

## 2013-08-24 DIAGNOSIS — C50919 Malignant neoplasm of unspecified site of unspecified female breast: Secondary | ICD-10-CM | POA: Diagnosis not present

## 2013-08-25 DIAGNOSIS — C50919 Malignant neoplasm of unspecified site of unspecified female breast: Secondary | ICD-10-CM | POA: Diagnosis not present

## 2013-09-04 DIAGNOSIS — I4891 Unspecified atrial fibrillation: Secondary | ICD-10-CM | POA: Diagnosis not present

## 2013-09-04 DIAGNOSIS — C50419 Malignant neoplasm of upper-outer quadrant of unspecified female breast: Secondary | ICD-10-CM | POA: Diagnosis not present

## 2013-09-06 DIAGNOSIS — I10 Essential (primary) hypertension: Secondary | ICD-10-CM | POA: Diagnosis not present

## 2013-09-06 DIAGNOSIS — I4891 Unspecified atrial fibrillation: Secondary | ICD-10-CM | POA: Diagnosis not present

## 2013-09-06 DIAGNOSIS — E785 Hyperlipidemia, unspecified: Secondary | ICD-10-CM | POA: Diagnosis not present

## 2013-09-06 DIAGNOSIS — Z01810 Encounter for preprocedural cardiovascular examination: Secondary | ICD-10-CM | POA: Diagnosis not present

## 2013-09-06 DIAGNOSIS — I6350 Cerebral infarction due to unspecified occlusion or stenosis of unspecified cerebral artery: Secondary | ICD-10-CM | POA: Diagnosis not present

## 2013-09-06 DIAGNOSIS — Z831 Family history of other infectious and parasitic diseases: Secondary | ICD-10-CM | POA: Diagnosis not present

## 2013-09-07 DIAGNOSIS — Z01810 Encounter for preprocedural cardiovascular examination: Secondary | ICD-10-CM | POA: Diagnosis not present

## 2013-09-07 DIAGNOSIS — I4891 Unspecified atrial fibrillation: Secondary | ICD-10-CM | POA: Diagnosis not present

## 2013-09-18 DIAGNOSIS — N189 Chronic kidney disease, unspecified: Secondary | ICD-10-CM | POA: Diagnosis not present

## 2013-09-18 DIAGNOSIS — F0390 Unspecified dementia without behavioral disturbance: Secondary | ICD-10-CM | POA: Diagnosis not present

## 2013-09-18 DIAGNOSIS — E119 Type 2 diabetes mellitus without complications: Secondary | ICD-10-CM | POA: Diagnosis not present

## 2013-09-18 DIAGNOSIS — Z7901 Long term (current) use of anticoagulants: Secondary | ICD-10-CM | POA: Diagnosis not present

## 2013-09-18 DIAGNOSIS — E785 Hyperlipidemia, unspecified: Secondary | ICD-10-CM | POA: Diagnosis not present

## 2013-09-18 DIAGNOSIS — C50419 Malignant neoplasm of upper-outer quadrant of unspecified female breast: Secondary | ICD-10-CM | POA: Diagnosis not present

## 2013-09-18 DIAGNOSIS — I4891 Unspecified atrial fibrillation: Secondary | ICD-10-CM | POA: Diagnosis not present

## 2013-09-18 DIAGNOSIS — I129 Hypertensive chronic kidney disease with stage 1 through stage 4 chronic kidney disease, or unspecified chronic kidney disease: Secondary | ICD-10-CM | POA: Diagnosis not present

## 2013-09-18 DIAGNOSIS — Z79899 Other long term (current) drug therapy: Secondary | ICD-10-CM | POA: Diagnosis not present

## 2013-09-20 DIAGNOSIS — C50919 Malignant neoplasm of unspecified site of unspecified female breast: Secondary | ICD-10-CM | POA: Diagnosis not present

## 2013-09-20 DIAGNOSIS — I4891 Unspecified atrial fibrillation: Secondary | ICD-10-CM | POA: Diagnosis not present

## 2013-09-20 DIAGNOSIS — Z8673 Personal history of transient ischemic attack (TIA), and cerebral infarction without residual deficits: Secondary | ICD-10-CM | POA: Diagnosis not present

## 2013-09-20 DIAGNOSIS — N6489 Other specified disorders of breast: Secondary | ICD-10-CM | POA: Diagnosis not present

## 2013-09-20 DIAGNOSIS — C50419 Malignant neoplasm of upper-outer quadrant of unspecified female breast: Secondary | ICD-10-CM | POA: Diagnosis not present

## 2013-09-20 DIAGNOSIS — Z79899 Other long term (current) drug therapy: Secondary | ICD-10-CM | POA: Diagnosis not present

## 2013-09-20 DIAGNOSIS — Z7901 Long term (current) use of anticoagulants: Secondary | ICD-10-CM | POA: Diagnosis not present

## 2013-09-20 DIAGNOSIS — E785 Hyperlipidemia, unspecified: Secondary | ICD-10-CM | POA: Diagnosis not present

## 2013-09-20 DIAGNOSIS — E119 Type 2 diabetes mellitus without complications: Secondary | ICD-10-CM | POA: Diagnosis not present

## 2013-10-18 DIAGNOSIS — C50419 Malignant neoplasm of upper-outer quadrant of unspecified female breast: Secondary | ICD-10-CM | POA: Diagnosis not present

## 2013-11-02 DIAGNOSIS — L97409 Non-pressure chronic ulcer of unspecified heel and midfoot with unspecified severity: Secondary | ICD-10-CM | POA: Diagnosis not present

## 2013-11-02 DIAGNOSIS — L02619 Cutaneous abscess of unspecified foot: Secondary | ICD-10-CM | POA: Diagnosis not present

## 2013-11-02 DIAGNOSIS — L03119 Cellulitis of unspecified part of limb: Secondary | ICD-10-CM | POA: Diagnosis not present

## 2013-11-02 DIAGNOSIS — E1149 Type 2 diabetes mellitus with other diabetic neurological complication: Secondary | ICD-10-CM | POA: Diagnosis not present

## 2013-11-02 DIAGNOSIS — E1142 Type 2 diabetes mellitus with diabetic polyneuropathy: Secondary | ICD-10-CM | POA: Diagnosis not present

## 2013-11-07 DIAGNOSIS — E1149 Type 2 diabetes mellitus with other diabetic neurological complication: Secondary | ICD-10-CM | POA: Diagnosis not present

## 2013-11-07 DIAGNOSIS — E1142 Type 2 diabetes mellitus with diabetic polyneuropathy: Secondary | ICD-10-CM | POA: Diagnosis not present

## 2013-11-07 DIAGNOSIS — L97409 Non-pressure chronic ulcer of unspecified heel and midfoot with unspecified severity: Secondary | ICD-10-CM | POA: Diagnosis not present

## 2013-11-21 DIAGNOSIS — E1149 Type 2 diabetes mellitus with other diabetic neurological complication: Secondary | ICD-10-CM | POA: Diagnosis not present

## 2013-11-21 DIAGNOSIS — E1142 Type 2 diabetes mellitus with diabetic polyneuropathy: Secondary | ICD-10-CM | POA: Diagnosis not present

## 2013-11-21 DIAGNOSIS — L97409 Non-pressure chronic ulcer of unspecified heel and midfoot with unspecified severity: Secondary | ICD-10-CM | POA: Diagnosis not present

## 2013-11-21 DIAGNOSIS — L02619 Cutaneous abscess of unspecified foot: Secondary | ICD-10-CM | POA: Diagnosis not present

## 2014-01-09 DIAGNOSIS — E1142 Type 2 diabetes mellitus with diabetic polyneuropathy: Secondary | ICD-10-CM | POA: Diagnosis not present

## 2014-01-09 DIAGNOSIS — L97409 Non-pressure chronic ulcer of unspecified heel and midfoot with unspecified severity: Secondary | ICD-10-CM | POA: Diagnosis not present

## 2014-01-09 DIAGNOSIS — E1149 Type 2 diabetes mellitus with other diabetic neurological complication: Secondary | ICD-10-CM | POA: Diagnosis not present

## 2014-01-23 DIAGNOSIS — Z23 Encounter for immunization: Secondary | ICD-10-CM | POA: Diagnosis not present

## 2014-01-30 DIAGNOSIS — Z85828 Personal history of other malignant neoplasm of skin: Secondary | ICD-10-CM | POA: Diagnosis not present

## 2014-01-30 DIAGNOSIS — L812 Freckles: Secondary | ICD-10-CM | POA: Diagnosis not present

## 2014-01-30 DIAGNOSIS — L821 Other seborrheic keratosis: Secondary | ICD-10-CM | POA: Diagnosis not present

## 2014-02-06 DIAGNOSIS — E780 Pure hypercholesterolemia: Secondary | ICD-10-CM | POA: Diagnosis not present

## 2014-02-06 DIAGNOSIS — E1122 Type 2 diabetes mellitus with diabetic chronic kidney disease: Secondary | ICD-10-CM | POA: Diagnosis not present

## 2014-02-06 DIAGNOSIS — N183 Chronic kidney disease, stage 3 (moderate): Secondary | ICD-10-CM | POA: Diagnosis not present

## 2014-02-06 DIAGNOSIS — Z0000 Encounter for general adult medical examination without abnormal findings: Secondary | ICD-10-CM | POA: Diagnosis not present

## 2014-02-06 DIAGNOSIS — E1165 Type 2 diabetes mellitus with hyperglycemia: Secondary | ICD-10-CM | POA: Diagnosis not present

## 2014-02-06 DIAGNOSIS — Z23 Encounter for immunization: Secondary | ICD-10-CM | POA: Diagnosis not present

## 2014-02-06 DIAGNOSIS — E1140 Type 2 diabetes mellitus with diabetic neuropathy, unspecified: Secondary | ICD-10-CM | POA: Diagnosis not present

## 2014-02-08 DIAGNOSIS — Z961 Presence of intraocular lens: Secondary | ICD-10-CM | POA: Diagnosis not present

## 2014-02-19 DIAGNOSIS — Z853 Personal history of malignant neoplasm of breast: Secondary | ICD-10-CM | POA: Diagnosis not present

## 2014-02-19 DIAGNOSIS — Z170 Estrogen receptor positive status [ER+]: Secondary | ICD-10-CM | POA: Diagnosis not present

## 2014-02-20 DIAGNOSIS — E119 Type 2 diabetes mellitus without complications: Secondary | ICD-10-CM | POA: Diagnosis not present

## 2014-02-20 DIAGNOSIS — I4891 Unspecified atrial fibrillation: Secondary | ICD-10-CM | POA: Diagnosis not present

## 2014-02-20 DIAGNOSIS — E785 Hyperlipidemia, unspecified: Secondary | ICD-10-CM | POA: Diagnosis not present

## 2014-02-20 DIAGNOSIS — I10 Essential (primary) hypertension: Secondary | ICD-10-CM | POA: Diagnosis not present

## 2014-02-28 DIAGNOSIS — I4891 Unspecified atrial fibrillation: Secondary | ICD-10-CM | POA: Diagnosis not present

## 2014-02-28 DIAGNOSIS — I10 Essential (primary) hypertension: Secondary | ICD-10-CM | POA: Diagnosis not present

## 2014-02-28 DIAGNOSIS — I639 Cerebral infarction, unspecified: Secondary | ICD-10-CM | POA: Diagnosis not present

## 2014-02-28 DIAGNOSIS — E119 Type 2 diabetes mellitus without complications: Secondary | ICD-10-CM | POA: Diagnosis not present

## 2014-02-28 DIAGNOSIS — E785 Hyperlipidemia, unspecified: Secondary | ICD-10-CM | POA: Diagnosis not present

## 2014-04-02 DIAGNOSIS — C50411 Malignant neoplasm of upper-outer quadrant of right female breast: Secondary | ICD-10-CM | POA: Diagnosis not present

## 2014-04-02 DIAGNOSIS — Z170 Estrogen receptor positive status [ER+]: Secondary | ICD-10-CM | POA: Diagnosis not present

## 2014-06-19 DIAGNOSIS — E1142 Type 2 diabetes mellitus with diabetic polyneuropathy: Secondary | ICD-10-CM | POA: Diagnosis not present

## 2014-06-19 DIAGNOSIS — E11621 Type 2 diabetes mellitus with foot ulcer: Secondary | ICD-10-CM | POA: Diagnosis not present

## 2014-06-19 DIAGNOSIS — L97529 Non-pressure chronic ulcer of other part of left foot with unspecified severity: Secondary | ICD-10-CM | POA: Diagnosis not present

## 2014-06-26 DIAGNOSIS — E11621 Type 2 diabetes mellitus with foot ulcer: Secondary | ICD-10-CM | POA: Diagnosis not present

## 2014-06-26 DIAGNOSIS — Z853 Personal history of malignant neoplasm of breast: Secondary | ICD-10-CM | POA: Diagnosis not present

## 2014-06-26 DIAGNOSIS — Z170 Estrogen receptor positive status [ER+]: Secondary | ICD-10-CM | POA: Diagnosis not present

## 2014-06-26 DIAGNOSIS — L97529 Non-pressure chronic ulcer of other part of left foot with unspecified severity: Secondary | ICD-10-CM | POA: Diagnosis not present

## 2014-06-26 DIAGNOSIS — E1142 Type 2 diabetes mellitus with diabetic polyneuropathy: Secondary | ICD-10-CM | POA: Diagnosis not present

## 2014-07-03 DIAGNOSIS — E11621 Type 2 diabetes mellitus with foot ulcer: Secondary | ICD-10-CM | POA: Diagnosis not present

## 2014-07-03 DIAGNOSIS — L97529 Non-pressure chronic ulcer of other part of left foot with unspecified severity: Secondary | ICD-10-CM | POA: Diagnosis not present

## 2014-07-03 DIAGNOSIS — E1142 Type 2 diabetes mellitus with diabetic polyneuropathy: Secondary | ICD-10-CM | POA: Diagnosis not present

## 2014-07-17 DIAGNOSIS — E11621 Type 2 diabetes mellitus with foot ulcer: Secondary | ICD-10-CM | POA: Diagnosis not present

## 2014-07-17 DIAGNOSIS — E1142 Type 2 diabetes mellitus with diabetic polyneuropathy: Secondary | ICD-10-CM | POA: Diagnosis not present

## 2014-07-17 DIAGNOSIS — L97529 Non-pressure chronic ulcer of other part of left foot with unspecified severity: Secondary | ICD-10-CM | POA: Diagnosis not present

## 2014-07-25 DIAGNOSIS — I482 Chronic atrial fibrillation: Secondary | ICD-10-CM | POA: Diagnosis not present

## 2014-07-25 DIAGNOSIS — I639 Cerebral infarction, unspecified: Secondary | ICD-10-CM | POA: Diagnosis not present

## 2014-07-25 DIAGNOSIS — R404 Transient alteration of awareness: Secondary | ICD-10-CM | POA: Diagnosis not present

## 2014-07-25 DIAGNOSIS — R42 Dizziness and giddiness: Secondary | ICD-10-CM | POA: Diagnosis not present

## 2014-07-25 DIAGNOSIS — Z8673 Personal history of transient ischemic attack (TIA), and cerebral infarction without residual deficits: Secondary | ICD-10-CM | POA: Diagnosis not present

## 2014-07-25 DIAGNOSIS — Z79899 Other long term (current) drug therapy: Secondary | ICD-10-CM | POA: Diagnosis not present

## 2014-07-25 DIAGNOSIS — I10 Essential (primary) hypertension: Secondary | ICD-10-CM | POA: Diagnosis present

## 2014-07-25 DIAGNOSIS — Z87891 Personal history of nicotine dependence: Secondary | ICD-10-CM | POA: Diagnosis not present

## 2014-07-25 DIAGNOSIS — R112 Nausea with vomiting, unspecified: Secondary | ICD-10-CM | POA: Diagnosis not present

## 2014-07-25 DIAGNOSIS — M1990 Unspecified osteoarthritis, unspecified site: Secondary | ICD-10-CM | POA: Diagnosis present

## 2014-07-25 DIAGNOSIS — E860 Dehydration: Secondary | ICD-10-CM | POA: Diagnosis present

## 2014-07-25 DIAGNOSIS — N179 Acute kidney failure, unspecified: Secondary | ICD-10-CM | POA: Diagnosis present

## 2014-07-25 DIAGNOSIS — I739 Peripheral vascular disease, unspecified: Secondary | ICD-10-CM | POA: Diagnosis not present

## 2014-07-25 DIAGNOSIS — F410 Panic disorder [episodic paroxysmal anxiety] without agoraphobia: Secondary | ICD-10-CM | POA: Diagnosis present

## 2014-07-25 DIAGNOSIS — Z7902 Long term (current) use of antithrombotics/antiplatelets: Secondary | ICD-10-CM | POA: Diagnosis not present

## 2014-07-25 DIAGNOSIS — Z90722 Acquired absence of ovaries, bilateral: Secondary | ICD-10-CM | POA: Diagnosis present

## 2014-07-25 DIAGNOSIS — Z794 Long term (current) use of insulin: Secondary | ICD-10-CM | POA: Diagnosis not present

## 2014-07-25 DIAGNOSIS — Z932 Ileostomy status: Secondary | ICD-10-CM | POA: Diagnosis not present

## 2014-07-25 DIAGNOSIS — J9811 Atelectasis: Secondary | ICD-10-CM | POA: Diagnosis not present

## 2014-07-25 DIAGNOSIS — Z9889 Other specified postprocedural states: Secondary | ICD-10-CM | POA: Diagnosis not present

## 2014-07-25 DIAGNOSIS — R260 Ataxic gait: Secondary | ICD-10-CM | POA: Diagnosis present

## 2014-07-25 DIAGNOSIS — F329 Major depressive disorder, single episode, unspecified: Secondary | ICD-10-CM | POA: Diagnosis present

## 2014-07-25 DIAGNOSIS — H8112 Benign paroxysmal vertigo, left ear: Secondary | ICD-10-CM | POA: Diagnosis present

## 2014-07-25 DIAGNOSIS — R531 Weakness: Secondary | ICD-10-CM | POA: Diagnosis not present

## 2014-07-25 DIAGNOSIS — E876 Hypokalemia: Secondary | ICD-10-CM | POA: Diagnosis present

## 2014-07-25 DIAGNOSIS — F0390 Unspecified dementia without behavioral disturbance: Secondary | ICD-10-CM | POA: Diagnosis present

## 2014-07-25 DIAGNOSIS — K5190 Ulcerative colitis, unspecified, without complications: Secondary | ICD-10-CM | POA: Diagnosis present

## 2014-07-25 DIAGNOSIS — E1165 Type 2 diabetes mellitus with hyperglycemia: Secondary | ICD-10-CM | POA: Diagnosis present

## 2014-07-25 DIAGNOSIS — E780 Pure hypercholesterolemia: Secondary | ICD-10-CM | POA: Diagnosis present

## 2014-07-25 DIAGNOSIS — H8110 Benign paroxysmal vertigo, unspecified ear: Secondary | ICD-10-CM | POA: Diagnosis not present

## 2014-07-25 DIAGNOSIS — Z87440 Personal history of urinary (tract) infections: Secondary | ICD-10-CM | POA: Diagnosis not present

## 2014-08-02 DIAGNOSIS — E1122 Type 2 diabetes mellitus with diabetic chronic kidney disease: Secondary | ICD-10-CM | POA: Diagnosis not present

## 2014-08-02 DIAGNOSIS — E1165 Type 2 diabetes mellitus with hyperglycemia: Secondary | ICD-10-CM | POA: Diagnosis not present

## 2014-08-02 DIAGNOSIS — H8113 Benign paroxysmal vertigo, bilateral: Secondary | ICD-10-CM | POA: Diagnosis not present

## 2014-08-02 DIAGNOSIS — N183 Chronic kidney disease, stage 3 (moderate): Secondary | ICD-10-CM | POA: Diagnosis not present

## 2014-08-08 DIAGNOSIS — M6281 Muscle weakness (generalized): Secondary | ICD-10-CM | POA: Diagnosis not present

## 2014-08-08 DIAGNOSIS — R2689 Other abnormalities of gait and mobility: Secondary | ICD-10-CM | POA: Diagnosis not present

## 2014-08-08 DIAGNOSIS — H8110 Benign paroxysmal vertigo, unspecified ear: Secondary | ICD-10-CM | POA: Diagnosis not present

## 2014-08-10 DIAGNOSIS — M6281 Muscle weakness (generalized): Secondary | ICD-10-CM | POA: Diagnosis not present

## 2014-08-10 DIAGNOSIS — H8110 Benign paroxysmal vertigo, unspecified ear: Secondary | ICD-10-CM | POA: Diagnosis not present

## 2014-08-10 DIAGNOSIS — R2689 Other abnormalities of gait and mobility: Secondary | ICD-10-CM | POA: Diagnosis not present

## 2014-08-15 DIAGNOSIS — M6281 Muscle weakness (generalized): Secondary | ICD-10-CM | POA: Diagnosis not present

## 2014-08-15 DIAGNOSIS — R2689 Other abnormalities of gait and mobility: Secondary | ICD-10-CM | POA: Diagnosis not present

## 2014-08-15 DIAGNOSIS — H8110 Benign paroxysmal vertigo, unspecified ear: Secondary | ICD-10-CM | POA: Diagnosis not present

## 2014-08-16 DIAGNOSIS — E1142 Type 2 diabetes mellitus with diabetic polyneuropathy: Secondary | ICD-10-CM | POA: Diagnosis not present

## 2014-08-16 DIAGNOSIS — E11621 Type 2 diabetes mellitus with foot ulcer: Secondary | ICD-10-CM | POA: Diagnosis not present

## 2014-08-16 DIAGNOSIS — L97529 Non-pressure chronic ulcer of other part of left foot with unspecified severity: Secondary | ICD-10-CM | POA: Diagnosis not present

## 2014-08-20 DIAGNOSIS — Z79899 Other long term (current) drug therapy: Secondary | ICD-10-CM | POA: Diagnosis not present

## 2014-08-20 DIAGNOSIS — Z794 Long term (current) use of insulin: Secondary | ICD-10-CM | POA: Diagnosis not present

## 2014-08-20 DIAGNOSIS — I4891 Unspecified atrial fibrillation: Secondary | ICD-10-CM | POA: Diagnosis not present

## 2014-08-20 DIAGNOSIS — Z8249 Family history of ischemic heart disease and other diseases of the circulatory system: Secondary | ICD-10-CM | POA: Diagnosis not present

## 2014-08-20 DIAGNOSIS — N182 Chronic kidney disease, stage 2 (mild): Secondary | ICD-10-CM | POA: Diagnosis present

## 2014-08-20 DIAGNOSIS — F329 Major depressive disorder, single episode, unspecified: Secondary | ICD-10-CM | POA: Diagnosis present

## 2014-08-20 DIAGNOSIS — E1165 Type 2 diabetes mellitus with hyperglycemia: Secondary | ICD-10-CM | POA: Diagnosis present

## 2014-08-20 DIAGNOSIS — M1990 Unspecified osteoarthritis, unspecified site: Secondary | ICD-10-CM | POA: Diagnosis present

## 2014-08-20 DIAGNOSIS — Z90722 Acquired absence of ovaries, bilateral: Secondary | ICD-10-CM | POA: Diagnosis present

## 2014-08-20 DIAGNOSIS — M8589 Other specified disorders of bone density and structure, multiple sites: Secondary | ICD-10-CM | POA: Diagnosis not present

## 2014-08-20 DIAGNOSIS — I482 Chronic atrial fibrillation: Secondary | ICD-10-CM | POA: Diagnosis present

## 2014-08-20 DIAGNOSIS — C50411 Malignant neoplasm of upper-outer quadrant of right female breast: Secondary | ICD-10-CM | POA: Diagnosis present

## 2014-08-20 DIAGNOSIS — Z7901 Long term (current) use of anticoagulants: Secondary | ICD-10-CM | POA: Diagnosis not present

## 2014-08-20 DIAGNOSIS — Z79811 Long term (current) use of aromatase inhibitors: Secondary | ICD-10-CM | POA: Diagnosis not present

## 2014-08-20 DIAGNOSIS — S37009A Unspecified injury of unspecified kidney, initial encounter: Secondary | ICD-10-CM | POA: Diagnosis not present

## 2014-08-20 DIAGNOSIS — E860 Dehydration: Secondary | ICD-10-CM | POA: Diagnosis not present

## 2014-08-20 DIAGNOSIS — Z8673 Personal history of transient ischemic attack (TIA), and cerebral infarction without residual deficits: Secondary | ICD-10-CM | POA: Diagnosis not present

## 2014-08-20 DIAGNOSIS — R262 Difficulty in walking, not elsewhere classified: Secondary | ICD-10-CM | POA: Diagnosis present

## 2014-08-20 DIAGNOSIS — R079 Chest pain, unspecified: Secondary | ICD-10-CM | POA: Diagnosis not present

## 2014-08-20 DIAGNOSIS — F410 Panic disorder [episodic paroxysmal anxiety] without agoraphobia: Secondary | ICD-10-CM | POA: Diagnosis present

## 2014-08-20 DIAGNOSIS — H9190 Unspecified hearing loss, unspecified ear: Secondary | ICD-10-CM | POA: Diagnosis present

## 2014-08-20 DIAGNOSIS — K5190 Ulcerative colitis, unspecified, without complications: Secondary | ICD-10-CM | POA: Diagnosis present

## 2014-08-20 DIAGNOSIS — E780 Pure hypercholesterolemia: Secondary | ICD-10-CM | POA: Diagnosis present

## 2014-08-20 DIAGNOSIS — Z9889 Other specified postprocedural states: Secondary | ICD-10-CM | POA: Diagnosis not present

## 2014-08-20 DIAGNOSIS — Z87891 Personal history of nicotine dependence: Secondary | ICD-10-CM | POA: Diagnosis not present

## 2014-08-20 DIAGNOSIS — R002 Palpitations: Secondary | ICD-10-CM | POA: Diagnosis not present

## 2014-08-20 DIAGNOSIS — F0390 Unspecified dementia without behavioral disturbance: Secondary | ICD-10-CM | POA: Diagnosis present

## 2014-08-20 DIAGNOSIS — I129 Hypertensive chronic kidney disease with stage 1 through stage 4 chronic kidney disease, or unspecified chronic kidney disease: Secondary | ICD-10-CM | POA: Diagnosis present

## 2014-08-20 DIAGNOSIS — Z932 Ileostomy status: Secondary | ICD-10-CM | POA: Diagnosis not present

## 2014-08-20 DIAGNOSIS — N179 Acute kidney failure, unspecified: Secondary | ICD-10-CM | POA: Diagnosis present

## 2014-08-20 DIAGNOSIS — Z853 Personal history of malignant neoplasm of breast: Secondary | ICD-10-CM | POA: Diagnosis not present

## 2014-09-04 DIAGNOSIS — Z170 Estrogen receptor positive status [ER+]: Secondary | ICD-10-CM | POA: Diagnosis not present

## 2014-09-04 DIAGNOSIS — C50411 Malignant neoplasm of upper-outer quadrant of right female breast: Secondary | ICD-10-CM | POA: Diagnosis not present

## 2014-09-12 DIAGNOSIS — H8110 Benign paroxysmal vertigo, unspecified ear: Secondary | ICD-10-CM | POA: Diagnosis not present

## 2014-09-14 DIAGNOSIS — H8110 Benign paroxysmal vertigo, unspecified ear: Secondary | ICD-10-CM | POA: Diagnosis not present

## 2014-09-19 DIAGNOSIS — H8110 Benign paroxysmal vertigo, unspecified ear: Secondary | ICD-10-CM | POA: Diagnosis not present

## 2014-09-19 DIAGNOSIS — M6281 Muscle weakness (generalized): Secondary | ICD-10-CM | POA: Diagnosis not present

## 2014-09-19 DIAGNOSIS — R2689 Other abnormalities of gait and mobility: Secondary | ICD-10-CM | POA: Diagnosis not present

## 2014-09-21 DIAGNOSIS — R2689 Other abnormalities of gait and mobility: Secondary | ICD-10-CM | POA: Diagnosis not present

## 2014-09-21 DIAGNOSIS — M6281 Muscle weakness (generalized): Secondary | ICD-10-CM | POA: Diagnosis not present

## 2014-09-21 DIAGNOSIS — H8110 Benign paroxysmal vertigo, unspecified ear: Secondary | ICD-10-CM | POA: Diagnosis not present

## 2014-09-26 DIAGNOSIS — H8110 Benign paroxysmal vertigo, unspecified ear: Secondary | ICD-10-CM | POA: Diagnosis not present

## 2014-09-26 DIAGNOSIS — R2689 Other abnormalities of gait and mobility: Secondary | ICD-10-CM | POA: Diagnosis not present

## 2014-09-26 DIAGNOSIS — M6281 Muscle weakness (generalized): Secondary | ICD-10-CM | POA: Diagnosis not present

## 2014-09-28 DIAGNOSIS — M6281 Muscle weakness (generalized): Secondary | ICD-10-CM | POA: Diagnosis not present

## 2014-09-28 DIAGNOSIS — R2689 Other abnormalities of gait and mobility: Secondary | ICD-10-CM | POA: Diagnosis not present

## 2014-09-28 DIAGNOSIS — H8110 Benign paroxysmal vertigo, unspecified ear: Secondary | ICD-10-CM | POA: Diagnosis not present

## 2014-10-01 DIAGNOSIS — M6281 Muscle weakness (generalized): Secondary | ICD-10-CM | POA: Diagnosis not present

## 2014-10-01 DIAGNOSIS — H8110 Benign paroxysmal vertigo, unspecified ear: Secondary | ICD-10-CM | POA: Diagnosis not present

## 2014-10-01 DIAGNOSIS — R2689 Other abnormalities of gait and mobility: Secondary | ICD-10-CM | POA: Diagnosis not present

## 2014-10-03 DIAGNOSIS — R2689 Other abnormalities of gait and mobility: Secondary | ICD-10-CM | POA: Diagnosis not present

## 2014-10-03 DIAGNOSIS — H8110 Benign paroxysmal vertigo, unspecified ear: Secondary | ICD-10-CM | POA: Diagnosis not present

## 2014-10-03 DIAGNOSIS — M6281 Muscle weakness (generalized): Secondary | ICD-10-CM | POA: Diagnosis not present

## 2014-10-15 DIAGNOSIS — M6281 Muscle weakness (generalized): Secondary | ICD-10-CM | POA: Diagnosis not present

## 2014-10-15 DIAGNOSIS — R2689 Other abnormalities of gait and mobility: Secondary | ICD-10-CM | POA: Diagnosis not present

## 2014-10-15 DIAGNOSIS — H8110 Benign paroxysmal vertigo, unspecified ear: Secondary | ICD-10-CM | POA: Diagnosis not present

## 2014-10-17 DIAGNOSIS — R2689 Other abnormalities of gait and mobility: Secondary | ICD-10-CM | POA: Diagnosis not present

## 2014-10-17 DIAGNOSIS — M6281 Muscle weakness (generalized): Secondary | ICD-10-CM | POA: Diagnosis not present

## 2014-10-17 DIAGNOSIS — H8110 Benign paroxysmal vertigo, unspecified ear: Secondary | ICD-10-CM | POA: Diagnosis not present

## 2014-10-23 DIAGNOSIS — E1142 Type 2 diabetes mellitus with diabetic polyneuropathy: Secondary | ICD-10-CM | POA: Diagnosis not present

## 2014-10-23 DIAGNOSIS — E11621 Type 2 diabetes mellitus with foot ulcer: Secondary | ICD-10-CM | POA: Diagnosis not present

## 2014-10-23 DIAGNOSIS — L97529 Non-pressure chronic ulcer of other part of left foot with unspecified severity: Secondary | ICD-10-CM | POA: Diagnosis not present

## 2014-10-24 DIAGNOSIS — H8110 Benign paroxysmal vertigo, unspecified ear: Secondary | ICD-10-CM | POA: Diagnosis not present

## 2014-10-24 DIAGNOSIS — R2689 Other abnormalities of gait and mobility: Secondary | ICD-10-CM | POA: Diagnosis not present

## 2014-10-24 DIAGNOSIS — M6281 Muscle weakness (generalized): Secondary | ICD-10-CM | POA: Diagnosis not present

## 2014-10-26 DIAGNOSIS — H8110 Benign paroxysmal vertigo, unspecified ear: Secondary | ICD-10-CM | POA: Diagnosis not present

## 2014-10-26 DIAGNOSIS — M6281 Muscle weakness (generalized): Secondary | ICD-10-CM | POA: Diagnosis not present

## 2014-10-26 DIAGNOSIS — R2689 Other abnormalities of gait and mobility: Secondary | ICD-10-CM | POA: Diagnosis not present

## 2014-10-26 DIAGNOSIS — Z79811 Long term (current) use of aromatase inhibitors: Secondary | ICD-10-CM | POA: Diagnosis not present

## 2014-10-26 DIAGNOSIS — Z853 Personal history of malignant neoplasm of breast: Secondary | ICD-10-CM | POA: Diagnosis not present

## 2014-10-29 DIAGNOSIS — H8110 Benign paroxysmal vertigo, unspecified ear: Secondary | ICD-10-CM | POA: Diagnosis not present

## 2014-10-29 DIAGNOSIS — M6281 Muscle weakness (generalized): Secondary | ICD-10-CM | POA: Diagnosis not present

## 2014-10-29 DIAGNOSIS — R2689 Other abnormalities of gait and mobility: Secondary | ICD-10-CM | POA: Diagnosis not present

## 2014-10-31 DIAGNOSIS — M6281 Muscle weakness (generalized): Secondary | ICD-10-CM | POA: Diagnosis not present

## 2014-10-31 DIAGNOSIS — R2689 Other abnormalities of gait and mobility: Secondary | ICD-10-CM | POA: Diagnosis not present

## 2014-10-31 DIAGNOSIS — H8110 Benign paroxysmal vertigo, unspecified ear: Secondary | ICD-10-CM | POA: Diagnosis not present

## 2014-11-01 DIAGNOSIS — I482 Chronic atrial fibrillation, unspecified: Secondary | ICD-10-CM | POA: Insufficient documentation

## 2014-11-01 DIAGNOSIS — I10 Essential (primary) hypertension: Secondary | ICD-10-CM | POA: Insufficient documentation

## 2014-11-01 DIAGNOSIS — Z8673 Personal history of transient ischemic attack (TIA), and cerebral infarction without residual deficits: Secondary | ICD-10-CM | POA: Diagnosis not present

## 2014-11-01 DIAGNOSIS — E119 Type 2 diabetes mellitus without complications: Secondary | ICD-10-CM | POA: Insufficient documentation

## 2014-11-01 DIAGNOSIS — E118 Type 2 diabetes mellitus with unspecified complications: Secondary | ICD-10-CM | POA: Diagnosis not present

## 2014-11-14 DIAGNOSIS — R2689 Other abnormalities of gait and mobility: Secondary | ICD-10-CM | POA: Diagnosis not present

## 2014-11-14 DIAGNOSIS — M6281 Muscle weakness (generalized): Secondary | ICD-10-CM | POA: Diagnosis not present

## 2014-11-14 DIAGNOSIS — H8110 Benign paroxysmal vertigo, unspecified ear: Secondary | ICD-10-CM | POA: Diagnosis not present

## 2014-11-14 DIAGNOSIS — I371 Nonrheumatic pulmonary valve insufficiency: Secondary | ICD-10-CM | POA: Diagnosis not present

## 2014-11-14 DIAGNOSIS — I083 Combined rheumatic disorders of mitral, aortic and tricuspid valves: Secondary | ICD-10-CM | POA: Diagnosis not present

## 2014-11-16 DIAGNOSIS — M6281 Muscle weakness (generalized): Secondary | ICD-10-CM | POA: Diagnosis not present

## 2014-11-16 DIAGNOSIS — R2689 Other abnormalities of gait and mobility: Secondary | ICD-10-CM | POA: Diagnosis not present

## 2014-11-16 DIAGNOSIS — H8110 Benign paroxysmal vertigo, unspecified ear: Secondary | ICD-10-CM | POA: Diagnosis not present

## 2014-11-19 DIAGNOSIS — M6281 Muscle weakness (generalized): Secondary | ICD-10-CM | POA: Diagnosis not present

## 2014-11-19 DIAGNOSIS — R2689 Other abnormalities of gait and mobility: Secondary | ICD-10-CM | POA: Diagnosis not present

## 2014-11-19 DIAGNOSIS — H8110 Benign paroxysmal vertigo, unspecified ear: Secondary | ICD-10-CM | POA: Diagnosis not present

## 2014-11-30 DIAGNOSIS — I482 Chronic atrial fibrillation: Secondary | ICD-10-CM | POA: Diagnosis not present

## 2014-11-30 DIAGNOSIS — I10 Essential (primary) hypertension: Secondary | ICD-10-CM | POA: Diagnosis not present

## 2014-11-30 DIAGNOSIS — E118 Type 2 diabetes mellitus with unspecified complications: Secondary | ICD-10-CM | POA: Diagnosis not present

## 2014-12-11 DIAGNOSIS — N183 Chronic kidney disease, stage 3 (moderate): Secondary | ICD-10-CM | POA: Diagnosis not present

## 2014-12-11 DIAGNOSIS — E1165 Type 2 diabetes mellitus with hyperglycemia: Secondary | ICD-10-CM | POA: Diagnosis not present

## 2014-12-11 DIAGNOSIS — E1140 Type 2 diabetes mellitus with diabetic neuropathy, unspecified: Secondary | ICD-10-CM | POA: Diagnosis not present

## 2014-12-11 DIAGNOSIS — E0865 Diabetes mellitus due to underlying condition with hyperglycemia: Secondary | ICD-10-CM | POA: Diagnosis not present

## 2014-12-11 DIAGNOSIS — M1612 Unilateral primary osteoarthritis, left hip: Secondary | ICD-10-CM | POA: Diagnosis not present

## 2014-12-11 DIAGNOSIS — E1151 Type 2 diabetes mellitus with diabetic peripheral angiopathy without gangrene: Secondary | ICD-10-CM | POA: Diagnosis not present

## 2014-12-11 DIAGNOSIS — Z794 Long term (current) use of insulin: Secondary | ICD-10-CM | POA: Diagnosis not present

## 2014-12-11 DIAGNOSIS — E1169 Type 2 diabetes mellitus with other specified complication: Secondary | ICD-10-CM | POA: Diagnosis not present

## 2014-12-11 DIAGNOSIS — M25552 Pain in left hip: Secondary | ICD-10-CM | POA: Diagnosis not present

## 2014-12-11 DIAGNOSIS — E0822 Diabetes mellitus due to underlying condition with diabetic chronic kidney disease: Secondary | ICD-10-CM | POA: Diagnosis not present

## 2014-12-11 DIAGNOSIS — E785 Hyperlipidemia, unspecified: Secondary | ICD-10-CM | POA: Diagnosis not present

## 2014-12-13 DIAGNOSIS — M7072 Other bursitis of hip, left hip: Secondary | ICD-10-CM | POA: Diagnosis not present

## 2015-01-04 DIAGNOSIS — I482 Chronic atrial fibrillation: Secondary | ICD-10-CM | POA: Diagnosis not present

## 2015-01-04 DIAGNOSIS — I4891 Unspecified atrial fibrillation: Secondary | ICD-10-CM | POA: Diagnosis not present

## 2015-02-05 DIAGNOSIS — R262 Difficulty in walking, not elsewhere classified: Secondary | ICD-10-CM | POA: Diagnosis not present

## 2015-02-05 DIAGNOSIS — M25511 Pain in right shoulder: Secondary | ICD-10-CM | POA: Diagnosis not present

## 2015-02-05 DIAGNOSIS — S46911A Strain of unspecified muscle, fascia and tendon at shoulder and upper arm level, right arm, initial encounter: Secondary | ICD-10-CM | POA: Diagnosis not present

## 2015-02-05 DIAGNOSIS — S0990XA Unspecified injury of head, initial encounter: Secondary | ICD-10-CM | POA: Diagnosis not present

## 2015-02-05 DIAGNOSIS — I4891 Unspecified atrial fibrillation: Secondary | ICD-10-CM | POA: Diagnosis not present

## 2015-02-05 DIAGNOSIS — E1165 Type 2 diabetes mellitus with hyperglycemia: Secondary | ICD-10-CM | POA: Diagnosis not present

## 2015-02-06 DIAGNOSIS — R001 Bradycardia, unspecified: Secondary | ICD-10-CM | POA: Diagnosis not present

## 2015-02-06 DIAGNOSIS — I10 Essential (primary) hypertension: Secondary | ICD-10-CM | POA: Diagnosis not present

## 2015-02-06 DIAGNOSIS — E119 Type 2 diabetes mellitus without complications: Secondary | ICD-10-CM | POA: Diagnosis not present

## 2015-02-06 DIAGNOSIS — I482 Chronic atrial fibrillation: Secondary | ICD-10-CM | POA: Diagnosis not present

## 2015-02-14 DIAGNOSIS — I10 Essential (primary) hypertension: Secondary | ICD-10-CM | POA: Diagnosis not present

## 2015-02-14 DIAGNOSIS — Z23 Encounter for immunization: Secondary | ICD-10-CM | POA: Diagnosis not present

## 2015-02-14 DIAGNOSIS — I495 Sick sinus syndrome: Secondary | ICD-10-CM | POA: Diagnosis not present

## 2015-02-14 DIAGNOSIS — Z8673 Personal history of transient ischemic attack (TIA), and cerebral infarction without residual deficits: Secondary | ICD-10-CM | POA: Diagnosis not present

## 2015-02-14 DIAGNOSIS — I4891 Unspecified atrial fibrillation: Secondary | ICD-10-CM | POA: Diagnosis not present

## 2015-02-14 DIAGNOSIS — I499 Cardiac arrhythmia, unspecified: Secondary | ICD-10-CM | POA: Diagnosis not present

## 2015-02-14 DIAGNOSIS — Z79899 Other long term (current) drug therapy: Secondary | ICD-10-CM | POA: Diagnosis not present

## 2015-02-14 DIAGNOSIS — I482 Chronic atrial fibrillation: Secondary | ICD-10-CM | POA: Diagnosis not present

## 2015-02-14 DIAGNOSIS — E785 Hyperlipidemia, unspecified: Secondary | ICD-10-CM | POA: Diagnosis not present

## 2015-02-14 DIAGNOSIS — Z9181 History of falling: Secondary | ICD-10-CM | POA: Diagnosis not present

## 2015-02-14 DIAGNOSIS — E119 Type 2 diabetes mellitus without complications: Secondary | ICD-10-CM | POA: Diagnosis not present

## 2015-02-14 DIAGNOSIS — F0390 Unspecified dementia without behavioral disturbance: Secondary | ICD-10-CM | POA: Diagnosis not present

## 2015-02-14 DIAGNOSIS — Z7901 Long term (current) use of anticoagulants: Secondary | ICD-10-CM | POA: Diagnosis not present

## 2015-02-15 DIAGNOSIS — I495 Sick sinus syndrome: Secondary | ICD-10-CM | POA: Diagnosis not present

## 2015-02-15 DIAGNOSIS — Z23 Encounter for immunization: Secondary | ICD-10-CM | POA: Diagnosis not present

## 2015-02-15 DIAGNOSIS — I482 Chronic atrial fibrillation: Secondary | ICD-10-CM | POA: Diagnosis not present

## 2015-02-15 DIAGNOSIS — E785 Hyperlipidemia, unspecified: Secondary | ICD-10-CM | POA: Diagnosis not present

## 2015-02-15 DIAGNOSIS — Z9181 History of falling: Secondary | ICD-10-CM | POA: Diagnosis not present

## 2015-02-15 DIAGNOSIS — I10 Essential (primary) hypertension: Secondary | ICD-10-CM | POA: Diagnosis not present

## 2015-02-26 DIAGNOSIS — C50919 Malignant neoplasm of unspecified site of unspecified female breast: Secondary | ICD-10-CM

## 2015-02-26 DIAGNOSIS — E11621 Type 2 diabetes mellitus with foot ulcer: Secondary | ICD-10-CM | POA: Diagnosis not present

## 2015-02-26 DIAGNOSIS — E1142 Type 2 diabetes mellitus with diabetic polyneuropathy: Secondary | ICD-10-CM | POA: Diagnosis not present

## 2015-02-26 DIAGNOSIS — L02612 Cutaneous abscess of left foot: Secondary | ICD-10-CM | POA: Diagnosis not present

## 2015-02-26 DIAGNOSIS — L03116 Cellulitis of left lower limb: Secondary | ICD-10-CM | POA: Diagnosis not present

## 2015-02-26 DIAGNOSIS — L97522 Non-pressure chronic ulcer of other part of left foot with fat layer exposed: Secondary | ICD-10-CM | POA: Diagnosis not present

## 2015-02-26 DIAGNOSIS — Z170 Estrogen receptor positive status [ER+]: Secondary | ICD-10-CM

## 2015-02-26 DIAGNOSIS — Z853 Personal history of malignant neoplasm of breast: Secondary | ICD-10-CM | POA: Diagnosis not present

## 2015-02-28 DIAGNOSIS — E11621 Type 2 diabetes mellitus with foot ulcer: Secondary | ICD-10-CM | POA: Diagnosis not present

## 2015-02-28 DIAGNOSIS — L03116 Cellulitis of left lower limb: Secondary | ICD-10-CM | POA: Diagnosis not present

## 2015-02-28 DIAGNOSIS — L97522 Non-pressure chronic ulcer of other part of left foot with fat layer exposed: Secondary | ICD-10-CM | POA: Diagnosis not present

## 2015-03-07 DIAGNOSIS — L97522 Non-pressure chronic ulcer of other part of left foot with fat layer exposed: Secondary | ICD-10-CM | POA: Diagnosis not present

## 2015-03-07 DIAGNOSIS — L03116 Cellulitis of left lower limb: Secondary | ICD-10-CM | POA: Diagnosis not present

## 2015-03-07 DIAGNOSIS — E1142 Type 2 diabetes mellitus with diabetic polyneuropathy: Secondary | ICD-10-CM | POA: Diagnosis not present

## 2015-03-07 DIAGNOSIS — E11621 Type 2 diabetes mellitus with foot ulcer: Secondary | ICD-10-CM | POA: Diagnosis not present

## 2015-03-11 DIAGNOSIS — Z950 Presence of cardiac pacemaker: Secondary | ICD-10-CM | POA: Insufficient documentation

## 2015-03-11 DIAGNOSIS — R001 Bradycardia, unspecified: Secondary | ICD-10-CM | POA: Diagnosis not present

## 2015-03-11 DIAGNOSIS — I482 Chronic atrial fibrillation: Secondary | ICD-10-CM | POA: Diagnosis not present

## 2015-03-11 DIAGNOSIS — R002 Palpitations: Secondary | ICD-10-CM | POA: Diagnosis not present

## 2015-03-11 DIAGNOSIS — I10 Essential (primary) hypertension: Secondary | ICD-10-CM | POA: Diagnosis not present

## 2015-03-26 DIAGNOSIS — E1142 Type 2 diabetes mellitus with diabetic polyneuropathy: Secondary | ICD-10-CM | POA: Diagnosis not present

## 2015-03-26 DIAGNOSIS — L97522 Non-pressure chronic ulcer of other part of left foot with fat layer exposed: Secondary | ICD-10-CM | POA: Diagnosis not present

## 2015-03-26 DIAGNOSIS — E11621 Type 2 diabetes mellitus with foot ulcer: Secondary | ICD-10-CM | POA: Diagnosis not present

## 2015-03-28 DIAGNOSIS — I10 Essential (primary) hypertension: Secondary | ICD-10-CM | POA: Diagnosis not present

## 2015-03-28 DIAGNOSIS — R001 Bradycardia, unspecified: Secondary | ICD-10-CM | POA: Diagnosis not present

## 2015-03-28 DIAGNOSIS — I482 Chronic atrial fibrillation: Secondary | ICD-10-CM | POA: Diagnosis not present

## 2015-03-28 DIAGNOSIS — Z794 Long term (current) use of insulin: Secondary | ICD-10-CM | POA: Diagnosis not present

## 2015-03-28 DIAGNOSIS — E119 Type 2 diabetes mellitus without complications: Secondary | ICD-10-CM | POA: Diagnosis not present

## 2015-03-28 DIAGNOSIS — Z950 Presence of cardiac pacemaker: Secondary | ICD-10-CM | POA: Diagnosis not present

## 2015-04-12 DIAGNOSIS — E113293 Type 2 diabetes mellitus with mild nonproliferative diabetic retinopathy without macular edema, bilateral: Secondary | ICD-10-CM | POA: Diagnosis not present

## 2015-04-30 DIAGNOSIS — E11621 Type 2 diabetes mellitus with foot ulcer: Secondary | ICD-10-CM | POA: Diagnosis not present

## 2015-04-30 DIAGNOSIS — L97522 Non-pressure chronic ulcer of other part of left foot with fat layer exposed: Secondary | ICD-10-CM | POA: Diagnosis not present

## 2015-04-30 DIAGNOSIS — E1142 Type 2 diabetes mellitus with diabetic polyneuropathy: Secondary | ICD-10-CM | POA: Diagnosis not present

## 2015-05-14 DIAGNOSIS — E1142 Type 2 diabetes mellitus with diabetic polyneuropathy: Secondary | ICD-10-CM | POA: Diagnosis not present

## 2015-05-14 DIAGNOSIS — L97421 Non-pressure chronic ulcer of left heel and midfoot limited to breakdown of skin: Secondary | ICD-10-CM | POA: Diagnosis not present

## 2015-05-24 DIAGNOSIS — L814 Other melanin hyperpigmentation: Secondary | ICD-10-CM | POA: Diagnosis not present

## 2015-05-24 DIAGNOSIS — D1801 Hemangioma of skin and subcutaneous tissue: Secondary | ICD-10-CM | POA: Diagnosis not present

## 2015-05-24 DIAGNOSIS — L570 Actinic keratosis: Secondary | ICD-10-CM | POA: Diagnosis not present

## 2015-05-24 DIAGNOSIS — L820 Inflamed seborrheic keratosis: Secondary | ICD-10-CM | POA: Diagnosis not present

## 2015-05-24 DIAGNOSIS — D225 Melanocytic nevi of trunk: Secondary | ICD-10-CM | POA: Diagnosis not present

## 2015-06-04 DIAGNOSIS — E1142 Type 2 diabetes mellitus with diabetic polyneuropathy: Secondary | ICD-10-CM | POA: Diagnosis not present

## 2015-06-04 DIAGNOSIS — L97421 Non-pressure chronic ulcer of left heel and midfoot limited to breakdown of skin: Secondary | ICD-10-CM | POA: Insufficient documentation

## 2015-06-11 DIAGNOSIS — I4891 Unspecified atrial fibrillation: Secondary | ICD-10-CM | POA: Diagnosis not present

## 2015-06-11 DIAGNOSIS — E1165 Type 2 diabetes mellitus with hyperglycemia: Secondary | ICD-10-CM | POA: Diagnosis not present

## 2015-06-11 DIAGNOSIS — M25552 Pain in left hip: Secondary | ICD-10-CM | POA: Diagnosis not present

## 2015-06-11 DIAGNOSIS — Z0000 Encounter for general adult medical examination without abnormal findings: Secondary | ICD-10-CM | POA: Diagnosis not present

## 2015-06-11 DIAGNOSIS — E7800 Pure hypercholesterolemia, unspecified: Secondary | ICD-10-CM | POA: Diagnosis not present

## 2015-06-11 DIAGNOSIS — F329 Major depressive disorder, single episode, unspecified: Secondary | ICD-10-CM | POA: Diagnosis not present

## 2015-06-11 DIAGNOSIS — E1140 Type 2 diabetes mellitus with diabetic neuropathy, unspecified: Secondary | ICD-10-CM | POA: Diagnosis not present

## 2015-06-11 DIAGNOSIS — E785 Hyperlipidemia, unspecified: Secondary | ICD-10-CM | POA: Diagnosis not present

## 2015-06-12 DIAGNOSIS — R001 Bradycardia, unspecified: Secondary | ICD-10-CM | POA: Diagnosis not present

## 2015-06-12 DIAGNOSIS — Z950 Presence of cardiac pacemaker: Secondary | ICD-10-CM | POA: Diagnosis not present

## 2015-06-12 DIAGNOSIS — E119 Type 2 diabetes mellitus without complications: Secondary | ICD-10-CM | POA: Diagnosis not present

## 2015-06-12 DIAGNOSIS — I10 Essential (primary) hypertension: Secondary | ICD-10-CM | POA: Diagnosis not present

## 2015-06-12 DIAGNOSIS — Z794 Long term (current) use of insulin: Secondary | ICD-10-CM | POA: Diagnosis not present

## 2015-06-12 DIAGNOSIS — I482 Chronic atrial fibrillation: Secondary | ICD-10-CM | POA: Diagnosis not present

## 2015-06-13 ENCOUNTER — Other Ambulatory Visit: Payer: Self-pay

## 2015-06-13 NOTE — Patient Outreach (Signed)
[facility]) Care Management  06/13/2015  Delta 08-08-1934 [id number]   SUBJECTIVE: Telephone call to patient regarding NEXTGEN high risk referral. HIPAA verified with patient.  Discussed and offered [facility] care management services.  Patient refused services. Patient states her daughter is a Marine scientist and lives next door to her. Patient verbally agreed to received [facility] care management letter/brochure to discuss services with her daughter.    ASSESSMENT: NEXTGEN Tier 4 high risk referral.   PLAN: RNCM will refer patient to Josepha Pigg to close due to refusal of services.  RNCM will send patient [facility] care management outreach letter / brochure as requested.  RNCM will notify patients primary MD of refusal of services.   Quinn Plowman RN,BSN,CCM [facility] Telephonic  [phone]

## 2015-07-10 DIAGNOSIS — I4891 Unspecified atrial fibrillation: Secondary | ICD-10-CM | POA: Diagnosis not present

## 2015-07-10 DIAGNOSIS — R42 Dizziness and giddiness: Secondary | ICD-10-CM | POA: Diagnosis not present

## 2015-07-10 DIAGNOSIS — R531 Weakness: Secondary | ICD-10-CM | POA: Diagnosis not present

## 2015-07-10 DIAGNOSIS — Z853 Personal history of malignant neoplasm of breast: Secondary | ICD-10-CM | POA: Diagnosis not present

## 2015-08-06 DIAGNOSIS — L97421 Non-pressure chronic ulcer of left heel and midfoot limited to breakdown of skin: Secondary | ICD-10-CM | POA: Diagnosis not present

## 2015-08-06 DIAGNOSIS — E1142 Type 2 diabetes mellitus with diabetic polyneuropathy: Secondary | ICD-10-CM | POA: Diagnosis not present

## 2015-08-19 DIAGNOSIS — R928 Other abnormal and inconclusive findings on diagnostic imaging of breast: Secondary | ICD-10-CM | POA: Diagnosis not present

## 2015-08-21 DIAGNOSIS — C50411 Malignant neoplasm of upper-outer quadrant of right female breast: Secondary | ICD-10-CM | POA: Diagnosis not present

## 2015-08-22 DIAGNOSIS — L97421 Non-pressure chronic ulcer of left heel and midfoot limited to breakdown of skin: Secondary | ICD-10-CM | POA: Diagnosis not present

## 2015-08-22 DIAGNOSIS — E1142 Type 2 diabetes mellitus with diabetic polyneuropathy: Secondary | ICD-10-CM | POA: Diagnosis not present

## 2015-09-18 DIAGNOSIS — Z853 Personal history of malignant neoplasm of breast: Secondary | ICD-10-CM | POA: Diagnosis not present

## 2015-09-18 DIAGNOSIS — I10 Essential (primary) hypertension: Secondary | ICD-10-CM | POA: Diagnosis not present

## 2015-09-18 DIAGNOSIS — I4891 Unspecified atrial fibrillation: Secondary | ICD-10-CM | POA: Diagnosis not present

## 2015-09-18 DIAGNOSIS — F0390 Unspecified dementia without behavioral disturbance: Secondary | ICD-10-CM | POA: Diagnosis not present

## 2015-09-18 DIAGNOSIS — M1990 Unspecified osteoarthritis, unspecified site: Secondary | ICD-10-CM | POA: Diagnosis not present

## 2015-09-18 DIAGNOSIS — Z8673 Personal history of transient ischemic attack (TIA), and cerebral infarction without residual deficits: Secondary | ICD-10-CM | POA: Diagnosis not present

## 2015-09-18 DIAGNOSIS — G629 Polyneuropathy, unspecified: Secondary | ICD-10-CM | POA: Diagnosis not present

## 2015-09-18 DIAGNOSIS — E11621 Type 2 diabetes mellitus with foot ulcer: Secondary | ICD-10-CM | POA: Diagnosis not present

## 2015-09-18 DIAGNOSIS — Z794 Long term (current) use of insulin: Secondary | ICD-10-CM | POA: Diagnosis not present

## 2015-09-18 DIAGNOSIS — L97421 Non-pressure chronic ulcer of left heel and midfoot limited to breakdown of skin: Secondary | ICD-10-CM | POA: Diagnosis not present

## 2015-09-23 DIAGNOSIS — L97521 Non-pressure chronic ulcer of other part of left foot limited to breakdown of skin: Secondary | ICD-10-CM | POA: Diagnosis not present

## 2015-09-23 DIAGNOSIS — L97421 Non-pressure chronic ulcer of left heel and midfoot limited to breakdown of skin: Secondary | ICD-10-CM | POA: Diagnosis not present

## 2015-09-23 DIAGNOSIS — E11621 Type 2 diabetes mellitus with foot ulcer: Secondary | ICD-10-CM | POA: Diagnosis not present

## 2015-09-30 DIAGNOSIS — L97521 Non-pressure chronic ulcer of other part of left foot limited to breakdown of skin: Secondary | ICD-10-CM | POA: Diagnosis not present

## 2015-09-30 DIAGNOSIS — E11621 Type 2 diabetes mellitus with foot ulcer: Secondary | ICD-10-CM | POA: Diagnosis not present

## 2015-09-30 DIAGNOSIS — L97421 Non-pressure chronic ulcer of left heel and midfoot limited to breakdown of skin: Secondary | ICD-10-CM | POA: Diagnosis not present

## 2015-10-07 DIAGNOSIS — L97421 Non-pressure chronic ulcer of left heel and midfoot limited to breakdown of skin: Secondary | ICD-10-CM | POA: Diagnosis not present

## 2015-10-07 DIAGNOSIS — L97521 Non-pressure chronic ulcer of other part of left foot limited to breakdown of skin: Secondary | ICD-10-CM | POA: Diagnosis not present

## 2015-10-07 DIAGNOSIS — E11621 Type 2 diabetes mellitus with foot ulcer: Secondary | ICD-10-CM | POA: Diagnosis not present

## 2015-10-24 DIAGNOSIS — R002 Palpitations: Secondary | ICD-10-CM | POA: Diagnosis not present

## 2015-10-24 DIAGNOSIS — I2510 Atherosclerotic heart disease of native coronary artery without angina pectoris: Secondary | ICD-10-CM | POA: Diagnosis not present

## 2015-10-24 DIAGNOSIS — Z932 Ileostomy status: Secondary | ICD-10-CM | POA: Diagnosis not present

## 2015-10-24 DIAGNOSIS — F419 Anxiety disorder, unspecified: Secondary | ICD-10-CM | POA: Diagnosis not present

## 2015-10-24 DIAGNOSIS — R0789 Other chest pain: Secondary | ICD-10-CM | POA: Diagnosis not present

## 2015-10-24 DIAGNOSIS — E1165 Type 2 diabetes mellitus with hyperglycemia: Secondary | ICD-10-CM | POA: Diagnosis not present

## 2015-10-24 DIAGNOSIS — L97521 Non-pressure chronic ulcer of other part of left foot limited to breakdown of skin: Secondary | ICD-10-CM | POA: Diagnosis not present

## 2015-10-24 DIAGNOSIS — R072 Precordial pain: Secondary | ICD-10-CM | POA: Diagnosis not present

## 2015-10-24 DIAGNOSIS — Z9181 History of falling: Secondary | ICD-10-CM | POA: Diagnosis not present

## 2015-10-24 DIAGNOSIS — Z950 Presence of cardiac pacemaker: Secondary | ICD-10-CM | POA: Diagnosis not present

## 2015-10-24 DIAGNOSIS — I482 Chronic atrial fibrillation: Secondary | ICD-10-CM | POA: Diagnosis not present

## 2015-10-24 DIAGNOSIS — N183 Chronic kidney disease, stage 3 (moderate): Secondary | ICD-10-CM | POA: Diagnosis not present

## 2015-10-24 DIAGNOSIS — M542 Cervicalgia: Secondary | ICD-10-CM | POA: Diagnosis not present

## 2015-10-24 DIAGNOSIS — R42 Dizziness and giddiness: Secondary | ICD-10-CM | POA: Diagnosis not present

## 2015-10-24 DIAGNOSIS — Z8673 Personal history of transient ischemic attack (TIA), and cerebral infarction without residual deficits: Secondary | ICD-10-CM | POA: Diagnosis not present

## 2015-10-24 DIAGNOSIS — I10 Essential (primary) hypertension: Secondary | ICD-10-CM | POA: Diagnosis not present

## 2015-10-24 DIAGNOSIS — R079 Chest pain, unspecified: Secondary | ICD-10-CM | POA: Diagnosis not present

## 2015-10-24 DIAGNOSIS — L97421 Non-pressure chronic ulcer of left heel and midfoot limited to breakdown of skin: Secondary | ICD-10-CM | POA: Diagnosis not present

## 2015-10-24 DIAGNOSIS — E11621 Type 2 diabetes mellitus with foot ulcer: Secondary | ICD-10-CM | POA: Diagnosis not present

## 2015-10-24 DIAGNOSIS — Z794 Long term (current) use of insulin: Secondary | ICD-10-CM | POA: Diagnosis not present

## 2015-10-24 DIAGNOSIS — F0390 Unspecified dementia without behavioral disturbance: Secondary | ICD-10-CM | POA: Diagnosis not present

## 2015-10-24 DIAGNOSIS — Z79899 Other long term (current) drug therapy: Secondary | ICD-10-CM | POA: Diagnosis not present

## 2015-10-24 DIAGNOSIS — L84 Corns and callosities: Secondary | ICD-10-CM | POA: Diagnosis not present

## 2015-10-24 DIAGNOSIS — E1122 Type 2 diabetes mellitus with diabetic chronic kidney disease: Secondary | ICD-10-CM | POA: Diagnosis not present

## 2015-10-24 DIAGNOSIS — R0602 Shortness of breath: Secondary | ICD-10-CM | POA: Diagnosis not present

## 2015-10-24 DIAGNOSIS — R739 Hyperglycemia, unspecified: Secondary | ICD-10-CM | POA: Diagnosis not present

## 2015-10-24 DIAGNOSIS — I129 Hypertensive chronic kidney disease with stage 1 through stage 4 chronic kidney disease, or unspecified chronic kidney disease: Secondary | ICD-10-CM | POA: Diagnosis not present

## 2015-10-24 DIAGNOSIS — L97529 Non-pressure chronic ulcer of other part of left foot with unspecified severity: Secondary | ICD-10-CM | POA: Diagnosis not present

## 2015-10-24 DIAGNOSIS — R000 Tachycardia, unspecified: Secondary | ICD-10-CM | POA: Diagnosis not present

## 2015-10-25 DIAGNOSIS — R079 Chest pain, unspecified: Secondary | ICD-10-CM | POA: Diagnosis not present

## 2015-10-25 DIAGNOSIS — R0789 Other chest pain: Secondary | ICD-10-CM | POA: Diagnosis not present

## 2015-10-28 DIAGNOSIS — E1165 Type 2 diabetes mellitus with hyperglycemia: Secondary | ICD-10-CM | POA: Diagnosis not present

## 2015-10-28 DIAGNOSIS — I4891 Unspecified atrial fibrillation: Secondary | ICD-10-CM | POA: Diagnosis not present

## 2015-10-28 DIAGNOSIS — E1140 Type 2 diabetes mellitus with diabetic neuropathy, unspecified: Secondary | ICD-10-CM | POA: Diagnosis not present

## 2015-10-30 DIAGNOSIS — L97421 Non-pressure chronic ulcer of left heel and midfoot limited to breakdown of skin: Secondary | ICD-10-CM | POA: Diagnosis not present

## 2015-10-30 DIAGNOSIS — L84 Corns and callosities: Secondary | ICD-10-CM | POA: Diagnosis not present

## 2015-10-30 DIAGNOSIS — L97521 Non-pressure chronic ulcer of other part of left foot limited to breakdown of skin: Secondary | ICD-10-CM | POA: Diagnosis not present

## 2015-10-30 DIAGNOSIS — E11621 Type 2 diabetes mellitus with foot ulcer: Secondary | ICD-10-CM | POA: Diagnosis not present

## 2015-10-31 DIAGNOSIS — Z45018 Encounter for adjustment and management of other part of cardiac pacemaker: Secondary | ICD-10-CM | POA: Diagnosis not present

## 2015-10-31 DIAGNOSIS — I10 Essential (primary) hypertension: Secondary | ICD-10-CM | POA: Diagnosis not present

## 2015-10-31 DIAGNOSIS — Z950 Presence of cardiac pacemaker: Secondary | ICD-10-CM | POA: Diagnosis not present

## 2015-10-31 DIAGNOSIS — I482 Chronic atrial fibrillation: Secondary | ICD-10-CM | POA: Diagnosis not present

## 2015-10-31 DIAGNOSIS — R001 Bradycardia, unspecified: Secondary | ICD-10-CM | POA: Diagnosis not present

## 2015-11-11 DIAGNOSIS — E11621 Type 2 diabetes mellitus with foot ulcer: Secondary | ICD-10-CM | POA: Diagnosis not present

## 2015-11-11 DIAGNOSIS — L97521 Non-pressure chronic ulcer of other part of left foot limited to breakdown of skin: Secondary | ICD-10-CM | POA: Diagnosis not present

## 2015-11-11 DIAGNOSIS — L97421 Non-pressure chronic ulcer of left heel and midfoot limited to breakdown of skin: Secondary | ICD-10-CM | POA: Diagnosis not present

## 2015-11-12 DIAGNOSIS — E11621 Type 2 diabetes mellitus with foot ulcer: Secondary | ICD-10-CM | POA: Diagnosis not present

## 2015-11-12 DIAGNOSIS — L97422 Non-pressure chronic ulcer of left heel and midfoot with fat layer exposed: Secondary | ICD-10-CM | POA: Diagnosis not present

## 2015-11-12 DIAGNOSIS — S91302A Unspecified open wound, left foot, initial encounter: Secondary | ICD-10-CM | POA: Diagnosis not present

## 2015-11-19 DIAGNOSIS — L97521 Non-pressure chronic ulcer of other part of left foot limited to breakdown of skin: Secondary | ICD-10-CM | POA: Diagnosis not present

## 2015-11-19 DIAGNOSIS — E11621 Type 2 diabetes mellitus with foot ulcer: Secondary | ICD-10-CM | POA: Diagnosis not present

## 2015-11-19 DIAGNOSIS — L97421 Non-pressure chronic ulcer of left heel and midfoot limited to breakdown of skin: Secondary | ICD-10-CM | POA: Diagnosis not present

## 2015-11-25 DIAGNOSIS — I700 Atherosclerosis of aorta: Secondary | ICD-10-CM | POA: Diagnosis not present

## 2015-11-25 DIAGNOSIS — I708 Atherosclerosis of other arteries: Secondary | ICD-10-CM | POA: Diagnosis not present

## 2015-11-25 DIAGNOSIS — E11621 Type 2 diabetes mellitus with foot ulcer: Secondary | ICD-10-CM | POA: Diagnosis not present

## 2015-11-28 DIAGNOSIS — L97421 Non-pressure chronic ulcer of left heel and midfoot limited to breakdown of skin: Secondary | ICD-10-CM | POA: Diagnosis not present

## 2015-11-28 DIAGNOSIS — E113299 Type 2 diabetes mellitus with mild nonproliferative diabetic retinopathy without macular edema, unspecified eye: Secondary | ICD-10-CM | POA: Diagnosis not present

## 2015-11-28 DIAGNOSIS — L97521 Non-pressure chronic ulcer of other part of left foot limited to breakdown of skin: Secondary | ICD-10-CM | POA: Diagnosis not present

## 2015-11-28 DIAGNOSIS — E11621 Type 2 diabetes mellitus with foot ulcer: Secondary | ICD-10-CM | POA: Diagnosis not present

## 2015-12-05 DIAGNOSIS — L97421 Non-pressure chronic ulcer of left heel and midfoot limited to breakdown of skin: Secondary | ICD-10-CM | POA: Diagnosis not present

## 2015-12-05 DIAGNOSIS — E11621 Type 2 diabetes mellitus with foot ulcer: Secondary | ICD-10-CM | POA: Diagnosis not present

## 2015-12-05 DIAGNOSIS — L97521 Non-pressure chronic ulcer of other part of left foot limited to breakdown of skin: Secondary | ICD-10-CM | POA: Diagnosis not present

## 2015-12-11 DIAGNOSIS — L97429 Non-pressure chronic ulcer of left heel and midfoot with unspecified severity: Secondary | ICD-10-CM | POA: Diagnosis not present

## 2015-12-11 DIAGNOSIS — R001 Bradycardia, unspecified: Secondary | ICD-10-CM | POA: Diagnosis not present

## 2015-12-11 DIAGNOSIS — E11621 Type 2 diabetes mellitus with foot ulcer: Secondary | ICD-10-CM | POA: Diagnosis not present

## 2015-12-11 DIAGNOSIS — M7989 Other specified soft tissue disorders: Secondary | ICD-10-CM | POA: Diagnosis not present

## 2015-12-11 DIAGNOSIS — I10 Essential (primary) hypertension: Secondary | ICD-10-CM | POA: Diagnosis not present

## 2015-12-11 DIAGNOSIS — I482 Chronic atrial fibrillation: Secondary | ICD-10-CM | POA: Diagnosis not present

## 2015-12-11 DIAGNOSIS — R600 Localized edema: Secondary | ICD-10-CM | POA: Diagnosis not present

## 2015-12-11 DIAGNOSIS — Z950 Presence of cardiac pacemaker: Secondary | ICD-10-CM | POA: Diagnosis not present

## 2015-12-11 DIAGNOSIS — Z8673 Personal history of transient ischemic attack (TIA), and cerebral infarction without residual deficits: Secondary | ICD-10-CM | POA: Diagnosis not present

## 2015-12-12 DIAGNOSIS — L84 Corns and callosities: Secondary | ICD-10-CM | POA: Diagnosis not present

## 2015-12-12 DIAGNOSIS — L97421 Non-pressure chronic ulcer of left heel and midfoot limited to breakdown of skin: Secondary | ICD-10-CM | POA: Diagnosis not present

## 2015-12-12 DIAGNOSIS — E11621 Type 2 diabetes mellitus with foot ulcer: Secondary | ICD-10-CM | POA: Diagnosis not present

## 2015-12-19 DIAGNOSIS — L97422 Non-pressure chronic ulcer of left heel and midfoot with fat layer exposed: Secondary | ICD-10-CM | POA: Diagnosis not present

## 2015-12-19 DIAGNOSIS — L97521 Non-pressure chronic ulcer of other part of left foot limited to breakdown of skin: Secondary | ICD-10-CM | POA: Diagnosis not present

## 2015-12-19 DIAGNOSIS — E11621 Type 2 diabetes mellitus with foot ulcer: Secondary | ICD-10-CM | POA: Diagnosis not present

## 2015-12-25 DIAGNOSIS — L97522 Non-pressure chronic ulcer of other part of left foot with fat layer exposed: Secondary | ICD-10-CM | POA: Diagnosis not present

## 2015-12-25 DIAGNOSIS — E11621 Type 2 diabetes mellitus with foot ulcer: Secondary | ICD-10-CM | POA: Diagnosis not present

## 2015-12-25 DIAGNOSIS — L97422 Non-pressure chronic ulcer of left heel and midfoot with fat layer exposed: Secondary | ICD-10-CM | POA: Diagnosis not present

## 2016-01-01 DIAGNOSIS — L97522 Non-pressure chronic ulcer of other part of left foot with fat layer exposed: Secondary | ICD-10-CM | POA: Diagnosis not present

## 2016-01-01 DIAGNOSIS — E11621 Type 2 diabetes mellitus with foot ulcer: Secondary | ICD-10-CM | POA: Diagnosis not present

## 2016-01-01 DIAGNOSIS — L97422 Non-pressure chronic ulcer of left heel and midfoot with fat layer exposed: Secondary | ICD-10-CM | POA: Diagnosis not present

## 2016-01-07 DIAGNOSIS — L97422 Non-pressure chronic ulcer of left heel and midfoot with fat layer exposed: Secondary | ICD-10-CM | POA: Diagnosis not present

## 2016-01-07 DIAGNOSIS — L97522 Non-pressure chronic ulcer of other part of left foot with fat layer exposed: Secondary | ICD-10-CM | POA: Diagnosis not present

## 2016-01-07 DIAGNOSIS — E11621 Type 2 diabetes mellitus with foot ulcer: Secondary | ICD-10-CM | POA: Diagnosis not present

## 2016-01-15 DIAGNOSIS — E11621 Type 2 diabetes mellitus with foot ulcer: Secondary | ICD-10-CM | POA: Diagnosis not present

## 2016-01-15 DIAGNOSIS — L97422 Non-pressure chronic ulcer of left heel and midfoot with fat layer exposed: Secondary | ICD-10-CM | POA: Diagnosis not present

## 2016-01-30 DIAGNOSIS — L97422 Non-pressure chronic ulcer of left heel and midfoot with fat layer exposed: Secondary | ICD-10-CM | POA: Diagnosis not present

## 2016-01-30 DIAGNOSIS — E11621 Type 2 diabetes mellitus with foot ulcer: Secondary | ICD-10-CM | POA: Diagnosis not present

## 2016-01-30 DIAGNOSIS — L97522 Non-pressure chronic ulcer of other part of left foot with fat layer exposed: Secondary | ICD-10-CM | POA: Diagnosis not present

## 2016-02-04 DIAGNOSIS — L97422 Non-pressure chronic ulcer of left heel and midfoot with fat layer exposed: Secondary | ICD-10-CM | POA: Diagnosis not present

## 2016-02-04 DIAGNOSIS — E11621 Type 2 diabetes mellitus with foot ulcer: Secondary | ICD-10-CM | POA: Diagnosis not present

## 2016-02-19 DIAGNOSIS — L97422 Non-pressure chronic ulcer of left heel and midfoot with fat layer exposed: Secondary | ICD-10-CM | POA: Diagnosis not present

## 2016-02-19 DIAGNOSIS — L97522 Non-pressure chronic ulcer of other part of left foot with fat layer exposed: Secondary | ICD-10-CM | POA: Diagnosis not present

## 2016-02-19 DIAGNOSIS — E11621 Type 2 diabetes mellitus with foot ulcer: Secondary | ICD-10-CM | POA: Diagnosis not present

## 2016-02-26 DIAGNOSIS — L97422 Non-pressure chronic ulcer of left heel and midfoot with fat layer exposed: Secondary | ICD-10-CM | POA: Diagnosis not present

## 2016-02-26 DIAGNOSIS — E11621 Type 2 diabetes mellitus with foot ulcer: Secondary | ICD-10-CM | POA: Diagnosis not present

## 2016-02-26 DIAGNOSIS — Z23 Encounter for immunization: Secondary | ICD-10-CM | POA: Diagnosis not present

## 2016-03-04 DIAGNOSIS — L97522 Non-pressure chronic ulcer of other part of left foot with fat layer exposed: Secondary | ICD-10-CM | POA: Diagnosis not present

## 2016-03-04 DIAGNOSIS — L97421 Non-pressure chronic ulcer of left heel and midfoot limited to breakdown of skin: Secondary | ICD-10-CM | POA: Diagnosis not present

## 2016-03-04 DIAGNOSIS — E11621 Type 2 diabetes mellitus with foot ulcer: Secondary | ICD-10-CM | POA: Diagnosis not present

## 2016-03-10 DIAGNOSIS — E11621 Type 2 diabetes mellitus with foot ulcer: Secondary | ICD-10-CM | POA: Diagnosis not present

## 2016-03-10 DIAGNOSIS — I482 Chronic atrial fibrillation: Secondary | ICD-10-CM | POA: Diagnosis not present

## 2016-03-10 DIAGNOSIS — Z8673 Personal history of transient ischemic attack (TIA), and cerebral infarction without residual deficits: Secondary | ICD-10-CM | POA: Diagnosis not present

## 2016-03-10 DIAGNOSIS — L97421 Non-pressure chronic ulcer of left heel and midfoot limited to breakdown of skin: Secondary | ICD-10-CM | POA: Diagnosis not present

## 2016-03-10 DIAGNOSIS — R001 Bradycardia, unspecified: Secondary | ICD-10-CM | POA: Diagnosis not present

## 2016-03-10 DIAGNOSIS — Z950 Presence of cardiac pacemaker: Secondary | ICD-10-CM | POA: Diagnosis not present

## 2016-03-10 DIAGNOSIS — L84 Corns and callosities: Secondary | ICD-10-CM | POA: Diagnosis not present

## 2016-03-10 DIAGNOSIS — I10 Essential (primary) hypertension: Secondary | ICD-10-CM | POA: Diagnosis not present

## 2016-03-10 DIAGNOSIS — L97522 Non-pressure chronic ulcer of other part of left foot with fat layer exposed: Secondary | ICD-10-CM | POA: Diagnosis not present

## 2016-03-26 DIAGNOSIS — E11621 Type 2 diabetes mellitus with foot ulcer: Secondary | ICD-10-CM | POA: Diagnosis not present

## 2016-03-26 DIAGNOSIS — L97522 Non-pressure chronic ulcer of other part of left foot with fat layer exposed: Secondary | ICD-10-CM | POA: Diagnosis not present

## 2016-03-26 DIAGNOSIS — L97422 Non-pressure chronic ulcer of left heel and midfoot with fat layer exposed: Secondary | ICD-10-CM | POA: Diagnosis not present

## 2016-03-31 DIAGNOSIS — L97422 Non-pressure chronic ulcer of left heel and midfoot with fat layer exposed: Secondary | ICD-10-CM | POA: Diagnosis not present

## 2016-03-31 DIAGNOSIS — L97522 Non-pressure chronic ulcer of other part of left foot with fat layer exposed: Secondary | ICD-10-CM | POA: Diagnosis not present

## 2016-03-31 DIAGNOSIS — E11621 Type 2 diabetes mellitus with foot ulcer: Secondary | ICD-10-CM | POA: Diagnosis not present

## 2016-04-15 DIAGNOSIS — L97422 Non-pressure chronic ulcer of left heel and midfoot with fat layer exposed: Secondary | ICD-10-CM | POA: Diagnosis not present

## 2016-04-15 DIAGNOSIS — Z794 Long term (current) use of insulin: Secondary | ICD-10-CM | POA: Diagnosis not present

## 2016-04-15 DIAGNOSIS — E11621 Type 2 diabetes mellitus with foot ulcer: Secondary | ICD-10-CM | POA: Diagnosis not present

## 2016-04-15 DIAGNOSIS — L97521 Non-pressure chronic ulcer of other part of left foot limited to breakdown of skin: Secondary | ICD-10-CM | POA: Diagnosis not present

## 2016-04-28 DIAGNOSIS — L97421 Non-pressure chronic ulcer of left heel and midfoot limited to breakdown of skin: Secondary | ICD-10-CM | POA: Diagnosis not present

## 2016-04-28 DIAGNOSIS — E11621 Type 2 diabetes mellitus with foot ulcer: Secondary | ICD-10-CM | POA: Diagnosis not present

## 2016-04-28 DIAGNOSIS — L84 Corns and callosities: Secondary | ICD-10-CM | POA: Diagnosis not present

## 2016-04-28 DIAGNOSIS — L97522 Non-pressure chronic ulcer of other part of left foot with fat layer exposed: Secondary | ICD-10-CM | POA: Diagnosis not present

## 2016-05-04 DIAGNOSIS — S0083XA Contusion of other part of head, initial encounter: Secondary | ICD-10-CM | POA: Diagnosis not present

## 2016-05-04 DIAGNOSIS — R079 Chest pain, unspecified: Secondary | ICD-10-CM | POA: Diagnosis not present

## 2016-05-04 DIAGNOSIS — S0990XA Unspecified injury of head, initial encounter: Secondary | ICD-10-CM | POA: Diagnosis not present

## 2016-05-04 DIAGNOSIS — R531 Weakness: Secondary | ICD-10-CM | POA: Diagnosis not present

## 2016-05-04 DIAGNOSIS — R51 Headache: Secondary | ICD-10-CM | POA: Diagnosis not present

## 2016-05-04 DIAGNOSIS — R270 Ataxia, unspecified: Secondary | ICD-10-CM | POA: Diagnosis not present

## 2016-05-04 DIAGNOSIS — D72829 Elevated white blood cell count, unspecified: Secondary | ICD-10-CM | POA: Diagnosis not present

## 2016-05-04 DIAGNOSIS — R404 Transient alteration of awareness: Secondary | ICD-10-CM | POA: Diagnosis not present

## 2016-05-05 DIAGNOSIS — E119 Type 2 diabetes mellitus without complications: Secondary | ICD-10-CM | POA: Diagnosis not present

## 2016-05-05 DIAGNOSIS — E860 Dehydration: Secondary | ICD-10-CM | POA: Diagnosis not present

## 2016-05-05 DIAGNOSIS — S37009A Unspecified injury of unspecified kidney, initial encounter: Secondary | ICD-10-CM | POA: Diagnosis not present

## 2016-05-05 DIAGNOSIS — Z932 Ileostomy status: Secondary | ICD-10-CM

## 2016-05-05 DIAGNOSIS — I4891 Unspecified atrial fibrillation: Secondary | ICD-10-CM | POA: Diagnosis not present

## 2016-05-06 DIAGNOSIS — E1165 Type 2 diabetes mellitus with hyperglycemia: Secondary | ICD-10-CM | POA: Diagnosis not present

## 2016-05-06 DIAGNOSIS — Z79899 Other long term (current) drug therapy: Secondary | ICD-10-CM | POA: Diagnosis not present

## 2016-05-06 DIAGNOSIS — L97429 Non-pressure chronic ulcer of left heel and midfoot with unspecified severity: Secondary | ICD-10-CM | POA: Diagnosis not present

## 2016-05-06 DIAGNOSIS — Z950 Presence of cardiac pacemaker: Secondary | ICD-10-CM | POA: Diagnosis not present

## 2016-05-06 DIAGNOSIS — I129 Hypertensive chronic kidney disease with stage 1 through stage 4 chronic kidney disease, or unspecified chronic kidney disease: Secondary | ICD-10-CM | POA: Diagnosis present

## 2016-05-06 DIAGNOSIS — N189 Chronic kidney disease, unspecified: Secondary | ICD-10-CM | POA: Diagnosis not present

## 2016-05-06 DIAGNOSIS — F419 Anxiety disorder, unspecified: Secondary | ICD-10-CM | POA: Diagnosis present

## 2016-05-06 DIAGNOSIS — Z87440 Personal history of urinary (tract) infections: Secondary | ICD-10-CM | POA: Diagnosis not present

## 2016-05-06 DIAGNOSIS — M1990 Unspecified osteoarthritis, unspecified site: Secondary | ICD-10-CM | POA: Diagnosis present

## 2016-05-06 DIAGNOSIS — R531 Weakness: Secondary | ICD-10-CM | POA: Diagnosis not present

## 2016-05-06 DIAGNOSIS — E1122 Type 2 diabetes mellitus with diabetic chronic kidney disease: Secondary | ICD-10-CM | POA: Diagnosis not present

## 2016-05-06 DIAGNOSIS — Z932 Ileostomy status: Secondary | ICD-10-CM | POA: Diagnosis not present

## 2016-05-06 DIAGNOSIS — N179 Acute kidney failure, unspecified: Secondary | ICD-10-CM | POA: Diagnosis not present

## 2016-05-06 DIAGNOSIS — Z8673 Personal history of transient ischemic attack (TIA), and cerebral infarction without residual deficits: Secondary | ICD-10-CM | POA: Diagnosis not present

## 2016-05-06 DIAGNOSIS — M7732 Calcaneal spur, left foot: Secondary | ICD-10-CM | POA: Diagnosis not present

## 2016-05-06 DIAGNOSIS — L97521 Non-pressure chronic ulcer of other part of left foot limited to breakdown of skin: Secondary | ICD-10-CM | POA: Diagnosis present

## 2016-05-06 DIAGNOSIS — L0390 Cellulitis, unspecified: Secondary | ICD-10-CM | POA: Diagnosis not present

## 2016-05-06 DIAGNOSIS — Z792 Long term (current) use of antibiotics: Secondary | ICD-10-CM | POA: Diagnosis not present

## 2016-05-06 DIAGNOSIS — N183 Chronic kidney disease, stage 3 (moderate): Secondary | ICD-10-CM | POA: Diagnosis present

## 2016-05-06 DIAGNOSIS — S37009A Unspecified injury of unspecified kidney, initial encounter: Secondary | ICD-10-CM | POA: Diagnosis not present

## 2016-05-06 DIAGNOSIS — I482 Chronic atrial fibrillation: Secondary | ICD-10-CM | POA: Diagnosis not present

## 2016-05-06 DIAGNOSIS — Z794 Long term (current) use of insulin: Secondary | ICD-10-CM | POA: Diagnosis not present

## 2016-05-06 DIAGNOSIS — S37009D Unspecified injury of unspecified kidney, subsequent encounter: Secondary | ICD-10-CM | POA: Diagnosis not present

## 2016-05-06 DIAGNOSIS — E11621 Type 2 diabetes mellitus with foot ulcer: Secondary | ICD-10-CM | POA: Diagnosis not present

## 2016-05-06 DIAGNOSIS — E860 Dehydration: Secondary | ICD-10-CM | POA: Diagnosis not present

## 2016-05-06 DIAGNOSIS — L03116 Cellulitis of left lower limb: Secondary | ICD-10-CM | POA: Diagnosis not present

## 2016-05-06 DIAGNOSIS — Z881 Allergy status to other antibiotic agents status: Secondary | ICD-10-CM | POA: Diagnosis not present

## 2016-05-06 DIAGNOSIS — L97529 Non-pressure chronic ulcer of other part of left foot with unspecified severity: Secondary | ICD-10-CM | POA: Diagnosis not present

## 2016-05-07 DIAGNOSIS — L97529 Non-pressure chronic ulcer of other part of left foot with unspecified severity: Secondary | ICD-10-CM

## 2016-05-07 DIAGNOSIS — E1165 Type 2 diabetes mellitus with hyperglycemia: Secondary | ICD-10-CM

## 2016-05-09 DIAGNOSIS — S37009D Unspecified injury of unspecified kidney, subsequent encounter: Secondary | ICD-10-CM | POA: Diagnosis not present

## 2016-05-09 DIAGNOSIS — E860 Dehydration: Secondary | ICD-10-CM | POA: Diagnosis not present

## 2016-05-09 DIAGNOSIS — N189 Chronic kidney disease, unspecified: Secondary | ICD-10-CM | POA: Diagnosis not present

## 2016-05-09 DIAGNOSIS — L97529 Non-pressure chronic ulcer of other part of left foot with unspecified severity: Secondary | ICD-10-CM | POA: Diagnosis not present

## 2016-05-09 DIAGNOSIS — I4891 Unspecified atrial fibrillation: Secondary | ICD-10-CM | POA: Diagnosis not present

## 2016-05-09 DIAGNOSIS — L0390 Cellulitis, unspecified: Secondary | ICD-10-CM | POA: Diagnosis not present

## 2016-05-09 DIAGNOSIS — R531 Weakness: Secondary | ICD-10-CM | POA: Diagnosis not present

## 2016-05-09 DIAGNOSIS — E1151 Type 2 diabetes mellitus with diabetic peripheral angiopathy without gangrene: Secondary | ICD-10-CM | POA: Diagnosis not present

## 2016-05-09 DIAGNOSIS — R262 Difficulty in walking, not elsewhere classified: Secondary | ICD-10-CM | POA: Diagnosis not present

## 2016-05-09 DIAGNOSIS — S37009A Unspecified injury of unspecified kidney, initial encounter: Secondary | ICD-10-CM | POA: Diagnosis not present

## 2016-05-11 DIAGNOSIS — L0390 Cellulitis, unspecified: Secondary | ICD-10-CM | POA: Diagnosis not present

## 2016-05-11 DIAGNOSIS — I4891 Unspecified atrial fibrillation: Secondary | ICD-10-CM | POA: Diagnosis not present

## 2016-05-11 DIAGNOSIS — R262 Difficulty in walking, not elsewhere classified: Secondary | ICD-10-CM | POA: Diagnosis not present

## 2016-05-11 DIAGNOSIS — E1151 Type 2 diabetes mellitus with diabetic peripheral angiopathy without gangrene: Secondary | ICD-10-CM | POA: Diagnosis not present

## 2016-05-18 DIAGNOSIS — E11621 Type 2 diabetes mellitus with foot ulcer: Secondary | ICD-10-CM | POA: Diagnosis not present

## 2016-05-18 DIAGNOSIS — L84 Corns and callosities: Secondary | ICD-10-CM | POA: Diagnosis not present

## 2016-05-18 DIAGNOSIS — L97422 Non-pressure chronic ulcer of left heel and midfoot with fat layer exposed: Secondary | ICD-10-CM | POA: Diagnosis not present

## 2016-05-18 DIAGNOSIS — L97421 Non-pressure chronic ulcer of left heel and midfoot limited to breakdown of skin: Secondary | ICD-10-CM | POA: Diagnosis not present

## 2016-05-29 DIAGNOSIS — E1140 Type 2 diabetes mellitus with diabetic neuropathy, unspecified: Secondary | ICD-10-CM | POA: Diagnosis not present

## 2016-05-29 DIAGNOSIS — E1165 Type 2 diabetes mellitus with hyperglycemia: Secondary | ICD-10-CM | POA: Diagnosis not present

## 2016-05-29 DIAGNOSIS — L97421 Non-pressure chronic ulcer of left heel and midfoot limited to breakdown of skin: Secondary | ICD-10-CM | POA: Diagnosis not present

## 2016-05-29 DIAGNOSIS — L97522 Non-pressure chronic ulcer of other part of left foot with fat layer exposed: Secondary | ICD-10-CM | POA: Diagnosis not present

## 2016-05-29 DIAGNOSIS — L84 Corns and callosities: Secondary | ICD-10-CM | POA: Diagnosis not present

## 2016-05-29 DIAGNOSIS — E11621 Type 2 diabetes mellitus with foot ulcer: Secondary | ICD-10-CM | POA: Diagnosis not present

## 2016-06-23 DIAGNOSIS — L97421 Non-pressure chronic ulcer of left heel and midfoot limited to breakdown of skin: Secondary | ICD-10-CM | POA: Diagnosis not present

## 2016-06-23 DIAGNOSIS — L97522 Non-pressure chronic ulcer of other part of left foot with fat layer exposed: Secondary | ICD-10-CM | POA: Diagnosis not present

## 2016-06-23 DIAGNOSIS — E11621 Type 2 diabetes mellitus with foot ulcer: Secondary | ICD-10-CM | POA: Diagnosis not present

## 2016-06-23 DIAGNOSIS — L03115 Cellulitis of right lower limb: Secondary | ICD-10-CM | POA: Diagnosis not present

## 2016-06-23 DIAGNOSIS — L089 Local infection of the skin and subcutaneous tissue, unspecified: Secondary | ICD-10-CM | POA: Diagnosis not present

## 2016-06-30 DIAGNOSIS — L97522 Non-pressure chronic ulcer of other part of left foot with fat layer exposed: Secondary | ICD-10-CM | POA: Diagnosis not present

## 2016-06-30 DIAGNOSIS — L03116 Cellulitis of left lower limb: Secondary | ICD-10-CM | POA: Diagnosis not present

## 2016-06-30 DIAGNOSIS — E11621 Type 2 diabetes mellitus with foot ulcer: Secondary | ICD-10-CM | POA: Diagnosis not present

## 2016-06-30 DIAGNOSIS — L97421 Non-pressure chronic ulcer of left heel and midfoot limited to breakdown of skin: Secondary | ICD-10-CM | POA: Diagnosis not present

## 2016-07-07 DIAGNOSIS — L97421 Non-pressure chronic ulcer of left heel and midfoot limited to breakdown of skin: Secondary | ICD-10-CM | POA: Diagnosis not present

## 2016-07-07 DIAGNOSIS — E11621 Type 2 diabetes mellitus with foot ulcer: Secondary | ICD-10-CM | POA: Diagnosis not present

## 2016-07-07 DIAGNOSIS — L97522 Non-pressure chronic ulcer of other part of left foot with fat layer exposed: Secondary | ICD-10-CM | POA: Diagnosis not present

## 2016-07-07 DIAGNOSIS — L97521 Non-pressure chronic ulcer of other part of left foot limited to breakdown of skin: Secondary | ICD-10-CM | POA: Diagnosis not present

## 2016-07-21 DIAGNOSIS — E11621 Type 2 diabetes mellitus with foot ulcer: Secondary | ICD-10-CM | POA: Diagnosis not present

## 2016-07-21 DIAGNOSIS — L97421 Non-pressure chronic ulcer of left heel and midfoot limited to breakdown of skin: Secondary | ICD-10-CM | POA: Diagnosis not present

## 2016-07-21 DIAGNOSIS — L97422 Non-pressure chronic ulcer of left heel and midfoot with fat layer exposed: Secondary | ICD-10-CM | POA: Diagnosis not present

## 2016-08-03 DIAGNOSIS — F0390 Unspecified dementia without behavioral disturbance: Secondary | ICD-10-CM | POA: Diagnosis not present

## 2016-08-03 DIAGNOSIS — L03116 Cellulitis of left lower limb: Secondary | ICD-10-CM | POA: Diagnosis not present

## 2016-08-03 DIAGNOSIS — Z7982 Long term (current) use of aspirin: Secondary | ICD-10-CM | POA: Diagnosis not present

## 2016-08-03 DIAGNOSIS — Z794 Long term (current) use of insulin: Secondary | ICD-10-CM | POA: Diagnosis not present

## 2016-08-03 DIAGNOSIS — E1122 Type 2 diabetes mellitus with diabetic chronic kidney disease: Secondary | ICD-10-CM | POA: Diagnosis not present

## 2016-08-03 DIAGNOSIS — I4891 Unspecified atrial fibrillation: Secondary | ICD-10-CM | POA: Diagnosis not present

## 2016-08-03 DIAGNOSIS — N179 Acute kidney failure, unspecified: Secondary | ICD-10-CM | POA: Diagnosis not present

## 2016-08-03 DIAGNOSIS — R4182 Altered mental status, unspecified: Secondary | ICD-10-CM | POA: Diagnosis not present

## 2016-08-03 DIAGNOSIS — Z8673 Personal history of transient ischemic attack (TIA), and cerebral infarction without residual deficits: Secondary | ICD-10-CM | POA: Diagnosis not present

## 2016-08-03 DIAGNOSIS — Z79899 Other long term (current) drug therapy: Secondary | ICD-10-CM | POA: Diagnosis not present

## 2016-08-03 DIAGNOSIS — L97521 Non-pressure chronic ulcer of other part of left foot limited to breakdown of skin: Secondary | ICD-10-CM | POA: Diagnosis not present

## 2016-08-03 DIAGNOSIS — N189 Chronic kidney disease, unspecified: Secondary | ICD-10-CM | POA: Diagnosis not present

## 2016-08-03 DIAGNOSIS — R531 Weakness: Secondary | ICD-10-CM | POA: Diagnosis not present

## 2016-08-03 DIAGNOSIS — R7309 Other abnormal glucose: Secondary | ICD-10-CM | POA: Diagnosis not present

## 2016-08-03 DIAGNOSIS — E860 Dehydration: Secondary | ICD-10-CM | POA: Diagnosis not present

## 2016-08-03 DIAGNOSIS — L97529 Non-pressure chronic ulcer of other part of left foot with unspecified severity: Secondary | ICD-10-CM | POA: Diagnosis not present

## 2016-08-03 DIAGNOSIS — I129 Hypertensive chronic kidney disease with stage 1 through stage 4 chronic kidney disease, or unspecified chronic kidney disease: Secondary | ICD-10-CM | POA: Diagnosis not present

## 2016-08-03 DIAGNOSIS — E1165 Type 2 diabetes mellitus with hyperglycemia: Secondary | ICD-10-CM | POA: Diagnosis not present

## 2016-08-04 DIAGNOSIS — L97529 Non-pressure chronic ulcer of other part of left foot with unspecified severity: Secondary | ICD-10-CM | POA: Diagnosis not present

## 2016-08-04 DIAGNOSIS — R4182 Altered mental status, unspecified: Secondary | ICD-10-CM | POA: Diagnosis not present

## 2016-08-04 DIAGNOSIS — L03116 Cellulitis of left lower limb: Secondary | ICD-10-CM | POA: Diagnosis not present

## 2016-08-04 DIAGNOSIS — S37009A Unspecified injury of unspecified kidney, initial encounter: Secondary | ICD-10-CM | POA: Diagnosis not present

## 2016-08-04 DIAGNOSIS — I4891 Unspecified atrial fibrillation: Secondary | ICD-10-CM | POA: Diagnosis not present

## 2016-08-04 DIAGNOSIS — R531 Weakness: Secondary | ICD-10-CM | POA: Diagnosis not present

## 2016-08-04 DIAGNOSIS — E119 Type 2 diabetes mellitus without complications: Secondary | ICD-10-CM | POA: Diagnosis not present

## 2016-08-05 DIAGNOSIS — L97529 Non-pressure chronic ulcer of other part of left foot with unspecified severity: Secondary | ICD-10-CM | POA: Diagnosis not present

## 2016-08-05 DIAGNOSIS — L03116 Cellulitis of left lower limb: Secondary | ICD-10-CM | POA: Diagnosis not present

## 2016-08-05 DIAGNOSIS — R531 Weakness: Secondary | ICD-10-CM | POA: Diagnosis not present

## 2016-08-05 DIAGNOSIS — E119 Type 2 diabetes mellitus without complications: Secondary | ICD-10-CM | POA: Diagnosis not present

## 2016-08-05 DIAGNOSIS — I4891 Unspecified atrial fibrillation: Secondary | ICD-10-CM | POA: Diagnosis not present

## 2016-08-05 DIAGNOSIS — S37009A Unspecified injury of unspecified kidney, initial encounter: Secondary | ICD-10-CM | POA: Diagnosis not present

## 2016-08-12 DIAGNOSIS — L97422 Non-pressure chronic ulcer of left heel and midfoot with fat layer exposed: Secondary | ICD-10-CM | POA: Diagnosis not present

## 2016-08-12 DIAGNOSIS — E11621 Type 2 diabetes mellitus with foot ulcer: Secondary | ICD-10-CM | POA: Diagnosis not present

## 2016-08-12 DIAGNOSIS — L97421 Non-pressure chronic ulcer of left heel and midfoot limited to breakdown of skin: Secondary | ICD-10-CM | POA: Diagnosis not present

## 2016-08-13 DIAGNOSIS — Z170 Estrogen receptor positive status [ER+]: Secondary | ICD-10-CM | POA: Diagnosis not present

## 2016-08-13 DIAGNOSIS — M1990 Unspecified osteoarthritis, unspecified site: Secondary | ICD-10-CM | POA: Diagnosis not present

## 2016-08-13 DIAGNOSIS — M545 Low back pain: Secondary | ICD-10-CM | POA: Diagnosis not present

## 2016-08-13 DIAGNOSIS — C50919 Malignant neoplasm of unspecified site of unspecified female breast: Secondary | ICD-10-CM | POA: Diagnosis not present

## 2016-08-13 DIAGNOSIS — Z79811 Long term (current) use of aromatase inhibitors: Secondary | ICD-10-CM | POA: Diagnosis not present

## 2016-08-13 DIAGNOSIS — Z853 Personal history of malignant neoplasm of breast: Secondary | ICD-10-CM | POA: Diagnosis not present

## 2016-08-19 DIAGNOSIS — Z0000 Encounter for general adult medical examination without abnormal findings: Secondary | ICD-10-CM | POA: Diagnosis not present

## 2016-08-19 DIAGNOSIS — R42 Dizziness and giddiness: Secondary | ICD-10-CM | POA: Diagnosis not present

## 2016-08-19 DIAGNOSIS — H6120 Impacted cerumen, unspecified ear: Secondary | ICD-10-CM | POA: Diagnosis not present

## 2016-08-19 DIAGNOSIS — Z1389 Encounter for screening for other disorder: Secondary | ICD-10-CM | POA: Diagnosis not present

## 2016-08-19 DIAGNOSIS — Z9181 History of falling: Secondary | ICD-10-CM | POA: Diagnosis not present

## 2016-08-25 DIAGNOSIS — L97422 Non-pressure chronic ulcer of left heel and midfoot with fat layer exposed: Secondary | ICD-10-CM | POA: Diagnosis not present

## 2016-08-25 DIAGNOSIS — E11621 Type 2 diabetes mellitus with foot ulcer: Secondary | ICD-10-CM | POA: Diagnosis not present

## 2016-08-25 DIAGNOSIS — L97421 Non-pressure chronic ulcer of left heel and midfoot limited to breakdown of skin: Secondary | ICD-10-CM | POA: Diagnosis not present

## 2016-09-08 DIAGNOSIS — I482 Chronic atrial fibrillation: Secondary | ICD-10-CM | POA: Diagnosis not present

## 2016-09-08 DIAGNOSIS — I10 Essential (primary) hypertension: Secondary | ICD-10-CM | POA: Diagnosis not present

## 2016-09-08 DIAGNOSIS — L97422 Non-pressure chronic ulcer of left heel and midfoot with fat layer exposed: Secondary | ICD-10-CM | POA: Diagnosis not present

## 2016-09-08 DIAGNOSIS — R001 Bradycardia, unspecified: Secondary | ICD-10-CM | POA: Diagnosis not present

## 2016-09-08 DIAGNOSIS — E11621 Type 2 diabetes mellitus with foot ulcer: Secondary | ICD-10-CM | POA: Diagnosis not present

## 2016-09-08 DIAGNOSIS — L97522 Non-pressure chronic ulcer of other part of left foot with fat layer exposed: Secondary | ICD-10-CM | POA: Diagnosis not present

## 2016-09-08 DIAGNOSIS — Z950 Presence of cardiac pacemaker: Secondary | ICD-10-CM | POA: Diagnosis not present

## 2016-09-08 DIAGNOSIS — Z8673 Personal history of transient ischemic attack (TIA), and cerebral infarction without residual deficits: Secondary | ICD-10-CM | POA: Diagnosis not present

## 2016-09-16 DIAGNOSIS — I482 Chronic atrial fibrillation: Secondary | ICD-10-CM | POA: Diagnosis not present

## 2016-09-16 DIAGNOSIS — C50411 Malignant neoplasm of upper-outer quadrant of right female breast: Secondary | ICD-10-CM | POA: Diagnosis not present

## 2016-09-16 DIAGNOSIS — R922 Inconclusive mammogram: Secondary | ICD-10-CM | POA: Diagnosis not present

## 2016-09-23 DIAGNOSIS — L97422 Non-pressure chronic ulcer of left heel and midfoot with fat layer exposed: Secondary | ICD-10-CM | POA: Diagnosis not present

## 2016-09-23 DIAGNOSIS — E11621 Type 2 diabetes mellitus with foot ulcer: Secondary | ICD-10-CM | POA: Diagnosis not present

## 2016-10-27 DIAGNOSIS — M8668 Other chronic osteomyelitis, other site: Secondary | ICD-10-CM | POA: Diagnosis not present

## 2016-10-27 DIAGNOSIS — E11621 Type 2 diabetes mellitus with foot ulcer: Secondary | ICD-10-CM | POA: Diagnosis not present

## 2016-10-27 DIAGNOSIS — L97422 Non-pressure chronic ulcer of left heel and midfoot with fat layer exposed: Secondary | ICD-10-CM | POA: Diagnosis not present

## 2016-10-27 DIAGNOSIS — L97522 Non-pressure chronic ulcer of other part of left foot with fat layer exposed: Secondary | ICD-10-CM | POA: Diagnosis not present

## 2016-11-19 ENCOUNTER — Encounter: Payer: Self-pay | Admitting: Sports Medicine

## 2016-11-19 ENCOUNTER — Ambulatory Visit (INDEPENDENT_AMBULATORY_CARE_PROVIDER_SITE_OTHER): Payer: Medicare Other | Admitting: Sports Medicine

## 2016-11-19 VITALS — BP 94/50 | HR 63 | Ht 66.0 in | Wt 124.0 lb

## 2016-11-19 DIAGNOSIS — M216X9 Other acquired deformities of unspecified foot: Secondary | ICD-10-CM | POA: Diagnosis not present

## 2016-11-19 DIAGNOSIS — E1140 Type 2 diabetes mellitus with diabetic neuropathy, unspecified: Secondary | ICD-10-CM | POA: Diagnosis not present

## 2016-11-19 DIAGNOSIS — L97529 Non-pressure chronic ulcer of other part of left foot with unspecified severity: Secondary | ICD-10-CM

## 2016-11-19 DIAGNOSIS — E11621 Type 2 diabetes mellitus with foot ulcer: Secondary | ICD-10-CM | POA: Diagnosis not present

## 2016-11-19 DIAGNOSIS — E1165 Type 2 diabetes mellitus with hyperglycemia: Secondary | ICD-10-CM | POA: Diagnosis not present

## 2016-11-19 DIAGNOSIS — L84 Corns and callosities: Secondary | ICD-10-CM

## 2016-11-19 DIAGNOSIS — R29898 Other symptoms and signs involving the musculoskeletal system: Secondary | ICD-10-CM | POA: Diagnosis not present

## 2016-11-19 DIAGNOSIS — L909 Atrophic disorder of skin, unspecified: Secondary | ICD-10-CM

## 2016-11-19 DIAGNOSIS — IMO0002 Reserved for concepts with insufficient information to code with codable children: Secondary | ICD-10-CM

## 2016-11-19 NOTE — Progress Notes (Signed)
Subjective: Cindy Hahn is a 82 y.o. female patient seen in office for evaluation of ulcerations of her left foot that his been present for several years, use to see Dr. Gean Quint in the past and now is going to wound care center and has been for years. Reports that Dr. Jerilee Hoh wants me to look at the wounds to see if any other things can be done. Patient had recent swab which was negative and circulation tests which were good per daughter. Patient has a history of diabetes and a blood glucose level yesterday 482m/dl. Patient's daughter is changing the dressing using sliver alginate at home. Hasn't been going to wound center for last 3 weeks. Denies nausea/fever/vomiting/chills/night sweats/shortness of breath/pain. Patient has no other pedal complaints at this time.  There are no active problems to display for this patient.  No current outpatient prescriptions on file prior to visit.   No current facility-administered medications on file prior to visit.    Allergies  Allergen Reactions  . Levofloxacin Other (See Comments)    confusion    No results found for this or any previous visit (from the past 2160 hour(s)).  Objective: Vitals:   11/19/16 1550  Weight: 124 lb (56.2 kg)  Height: 5' 6" (1.676 m)    General: Patient is awake, alert, oriented x 3 and in no acute distress.  Dermatology: Skin is warm and dry bilateral with a full thickness ulceration present Left central heel. Ulceration measures 1cm x 1cm x 0.5cm. There is a macerated border with a granular base. The ulceration does not probe to bone. There is no malodor, no active drainage, no erythema, no edema. No acute signs of infection. There is a ulceration sub met 5 on left that is partial thickness that measures 1cm x 0.5cm x 0.2cm with keratotic border. There is no malodor, no active drainage, no erythema, no edema. No acute signs of infection.   Pre-ulcerative callus sub met 1 bilateral and sub met 5 on  right.  Vascular: Dorsalis Pedis pulse = 1/4 Bilateral,  Posterior Tibial pulse = 1/4 Bilateral,  Capillary Fill Time < 5 seconds  Neurologic: Protective sensation absent to the level of ankles using the 5.07/10g [employer]  Musculosketal:  No Pain with palpation to ulcerated areas. No pain with compression to calves bilateral. + prominent metatarsals with fat pad atrophy deformities noted bilateral.  No results for input(s): GRAMSTAIN, LABORGA in the last 8760 hours.  Assessment and Plan:  Problem List Items Addressed This Visit    None    Visit Diagnoses    Chronic diabetic ulcer of left foot determined by examination ([place]    -  Primary   Relevant Medications   rosuvastatin (CRESTOR) 20 MG tablet   LEVEMIR 100 UNIT/ML injection   NOVOLOG FLEXPEN 100 UNIT/ML FlexPen   aspirin EC 81 MG tablet   Pre-ulcerative calluses       Fat pad atrophy of foot       Prominent metatarsal head, unspecified laterality       Uncontrolled type 2 diabetes with neuropathy (HCC)       Relevant Medications   rosuvastatin (CRESTOR) 20 MG tablet   LEVEMIR 100 UNIT/ML injection   NOVOLOG FLEXPEN 100 UNIT/ML FlexPen   aspirin EC 81 MG tablet     -Examined patient and discussed the progression of the wounds and callus and treatment alternatives. - Excisionally dedbrided ulcerations to healthy bleeding borders using a sterile chisel blade. -Applied iodoform packing plantar heel  and betadine and dry sterile dressing on left and instructed patient to continue with daily dressings at home consisting of same with help of daughter -Applied offloading padding to shoes -Mechanically debrided callus x3 using sterile chisel blade without incident -Reviewed chart from [place] patient to go to the ER or return to office if the wound worsens or if constitutional symptoms are present. -Patient to return to office in 2 weeks for follow up care and evaluation or sooner if  problems arise. Will consider CT scan and wound culture if no better next visit or wound graft.   Landis Martins, DPM

## 2016-11-19 NOTE — Progress Notes (Signed)
   Subjective:    Patient ID: Cindy Hahn, female    DOB: 01/25/1935, 82 y.o.   MRN: [id number]  HPI  I have been seeing Dr Jerilee Hoh for wound care for ulcers on the left heel and 5th met.  The doctor came to see me in the hospital in January for cellulitis.  My wounds just don't seem to be healing.     Review of Systems  Constitutional: Positive for activity change, appetite change, fatigue and unexpected weight change.  HENT: Positive for hearing loss and trouble swallowing.   Eyes: Positive for visual disturbance.  Respiratory: Positive for shortness of breath.   Endocrine: Positive for polydipsia.  Genitourinary: Positive for urgency.  Musculoskeletal: Positive for arthralgias and gait problem.  Skin: Positive for wound.  Neurological: Positive for dizziness, weakness and light-headedness.  Hematological: Bruises/bleeds easily.  Psychiatric/Behavioral: Positive for confusion.  All other systems reviewed and are negative.      Objective:   Physical Exam        Assessment & Plan:

## 2016-12-03 ENCOUNTER — Ambulatory Visit (INDEPENDENT_AMBULATORY_CARE_PROVIDER_SITE_OTHER): Payer: Medicare Other | Admitting: Sports Medicine

## 2016-12-03 ENCOUNTER — Other Ambulatory Visit: Payer: Self-pay

## 2016-12-03 ENCOUNTER — Ambulatory Visit: Payer: Medicare Other | Admitting: Sports Medicine

## 2016-12-03 DIAGNOSIS — E11621 Type 2 diabetes mellitus with foot ulcer: Secondary | ICD-10-CM | POA: Diagnosis not present

## 2016-12-03 DIAGNOSIS — R29898 Other symptoms and signs involving the musculoskeletal system: Secondary | ICD-10-CM

## 2016-12-03 DIAGNOSIS — IMO0002 Reserved for concepts with insufficient information to code with codable children: Secondary | ICD-10-CM

## 2016-12-03 DIAGNOSIS — L84 Corns and callosities: Secondary | ICD-10-CM

## 2016-12-03 DIAGNOSIS — L909 Atrophic disorder of skin, unspecified: Secondary | ICD-10-CM

## 2016-12-03 DIAGNOSIS — L97529 Non-pressure chronic ulcer of other part of left foot with unspecified severity: Secondary | ICD-10-CM | POA: Diagnosis not present

## 2016-12-03 DIAGNOSIS — E1165 Type 2 diabetes mellitus with hyperglycemia: Secondary | ICD-10-CM

## 2016-12-03 DIAGNOSIS — E1140 Type 2 diabetes mellitus with diabetic neuropathy, unspecified: Secondary | ICD-10-CM

## 2016-12-03 DIAGNOSIS — M216X9 Other acquired deformities of unspecified foot: Secondary | ICD-10-CM

## 2016-12-03 MED ORDER — SULFAMETHOXAZOLE-TRIMETHOPRIM 800-160 MG PO TABS: 1.0000 | ORAL_TABLET | Freq: Two times a day (BID) | ORAL | 0 refills | Status: DC

## 2016-12-03 NOTE — Progress Notes (Signed)
Subjective: Cindy Hahn is a 82 y.o. female patient seen in office for evaluation of ulcerations of her left foot and painful callus. Patient is assisted by daughter-in-law this visit. Patient has a history of diabetes and dementia and a history of falls. Patient's daughter is changing the dressing using iodoform packing at home. Denies nausea/fever/vomiting/chills/night sweats/shortness of breath. Admits pain and increased drainage from heel. Patient has no other pedal complaints at this time.  There are no active problems to display for this patient.  Current Outpatient Prescriptions on File Prior to Visit  Medication Sig Dispense Refill  . ALPRAZolam (XANAX) 1 MG tablet Take 1 mg by mouth.    Marland Kitchen anastrozole (ARIMIDEX) 1 MG tablet take 1 tablet by mouth once daily to [place].  0  . aspirin EC 81 MG tablet Take 81 mg by mouth.    . digoxin (LANOXIN) 0.125 MG tablet   0  . diltiazem (CARDIZEM) 60 MG tablet     . LEVEMIR 100 UNIT/ML injection     . metoprolol tartrate (LOPRESSOR) 50 MG tablet take 1 and 1/2 tablet by mouth twice a day  0  . NOVOLOG FLEXPEN 100 UNIT/ML FlexPen inject 10 units SUBCUTANEOUSLY WITH MAIN MEAL  0  . potassium chloride SA (K-DUR,KLOR-CON) 20 MEQ tablet Take 40 mEq by mouth daily.  0  . rosuvastatin (CRESTOR) 20 MG tablet   0  . sertraline (ZOLOFT) 100 MG tablet      No current facility-administered medications on file prior to visit.    Allergies  Allergen Reactions  . Levofloxacin Other (See Comments)    confusion    No results found for this or any previous visit (from the past 2160 hour(s)).  Objective: There were no vitals filed for this visit.  General: Patient is awake, alert, oriented x 3 and in no acute distress.  Dermatology: Skin is warm and dry bilateral with a full thickness ulceration present Left central heel. Ulceration measures 1cm x 1cm x 0.5cmUnchanged from previous. There is a mildly macerated border with a  granular base. The ulceration does not probe to bone. There is no active drainage, however on inner gauze layer. There is a yellow drainage noted with faint malodor, no erythema, no edema. No other acute signs of infection.  Pre-ulcerative callus sub met 1 bilateral and sub met 5 bilateral; on left previous sub-met 5 ulceration is now healed over.  Vascular: Dorsalis Pedis pulse = 1/4 Bilateral,  Posterior Tibial pulse = 1/4 Bilateral,  Capillary Fill Time < 5 seconds  Neurologic: Protective sensation absent to the level of ankles using the 5.07/10g [employer].  Musculosketal:  No Pain with palpation to ulcerated area at left heel. No pain with compression to calves bilateral. + prominent metatarsals with fat pad atrophy deformities noted bilateral.  No results for input(s): GRAMSTAIN, LABORGA in the last 8760 hours.  Assessment and Plan:  Problem List Items Addressed This Visit    None    Visit Diagnoses    Chronic diabetic ulcer of left foot determined by examination ([place])    -  Primary   Relevant Medications   sulfamethoxazole-trimethoprim (BACTRIM DS,SEPTRA DS) 800-160 MG tablet   Other Relevant Orders   WOUND CULTURE   Prominent metatarsal head, unspecified laterality       Pre-ulcerative calluses       Fat pad atrophy of foot       Uncontrolled type 2 diabetes with neuropathy ([place])         -  Examined patient and discussed the progression of the wound on left heel and callus and treatment alternatives. - Excisionally dedbrided ulceration on left heel to healthy bleeding borders using a sterile chisel blade. -Applied iodoform packing plantar heel and offloading pads and instructed patient to continue with daily dressings at home consisting of same with help of daughter -Dispensed postoperative shoes to wear bilateral -Started patient on Bactrim and sent wound culture from [facility] DX of the left heel wound drainage and will call patient if we need to change or  tailor antibiotics -Ordered CT scan to evaluate if there is any underlying changes to her bone -Advised patient to go to the ER or return to office if the wound worsens or if constitutional symptoms are present. -Patient to return to office in 2 weeks for follow up care and evaluation or sooner if problems arise. Will consider wound graft if CT is negative, and if we are able to get drainage to stop from her heel.   Landis Martins, DPM

## 2016-12-03 NOTE — Addendum Note (Signed)
Addended by: Landis Martins T on: 12/03/2016 05:59 PM   Modules accepted: Level of Service

## 2016-12-09 ENCOUNTER — Telehealth: Payer: Self-pay | Admitting: *Deleted

## 2016-12-09 ENCOUNTER — Other Ambulatory Visit: Payer: Self-pay | Admitting: Sports Medicine

## 2016-12-09 MED ORDER — FLUCONAZOLE 150 MG PO TABS: 150.0000 mg | ORAL_TABLET | Freq: Once | ORAL | 0 refills | Status: AC

## 2016-12-09 MED ORDER — AMOXICILLIN-POT CLAVULANATE 875-125 MG PO TABS: 1.0000 | ORAL_TABLET | Freq: Two times a day (BID) | ORAL | 0 refills | Status: DC

## 2016-12-09 NOTE — Telephone Encounter (Signed)
-----   Message from [place], [place] sent at 12/09/2016  8:20 AM EDT ----- Regarding: Wound culture  Results are + for multiple type of bacteria Patient should continue bactrim of which I prescribed when she was in office however send to the pharmacy Augmentin 875 bid x 14 days for her to take in addition to cover all the bacteria that grew out Thanks Dr. Cannon Kettle

## 2016-12-09 NOTE — Progress Notes (Signed)
Sent fluconazole to pharmacy  -Dr. Cannon Kettle

## 2016-12-09 NOTE — Telephone Encounter (Signed)
Spoke with patients daughter informed her of Dr Cannon Kettle results and orders.  Verified pharmacy.  Daughter questioned about the CT informed her we are working on it and once we receive authorization we will be in touch she stated understanding.

## 2016-12-11 ENCOUNTER — Telehealth: Payer: Self-pay | Admitting: *Deleted

## 2016-12-11 ENCOUNTER — Ambulatory Visit: Payer: Medicare Other | Admitting: Sports Medicine

## 2016-12-11 NOTE — Telephone Encounter (Addendum)
[facility] scheduled pt for 12/15/2016 at 11:00am to arrive 10:30am. Appt called to pt's dtr, Baird Lyons. Orders faxed to [facility].

## 2016-12-14 DIAGNOSIS — Z961 Presence of intraocular lens: Secondary | ICD-10-CM | POA: Diagnosis not present

## 2016-12-14 DIAGNOSIS — E113293 Type 2 diabetes mellitus with mild nonproliferative diabetic retinopathy without macular edema, bilateral: Secondary | ICD-10-CM | POA: Diagnosis not present

## 2017-01-01 ENCOUNTER — Encounter (INDEPENDENT_AMBULATORY_CARE_PROVIDER_SITE_OTHER): Payer: Self-pay

## 2017-01-01 ENCOUNTER — Ambulatory Visit (INDEPENDENT_AMBULATORY_CARE_PROVIDER_SITE_OTHER): Payer: Medicare Other | Admitting: Sports Medicine

## 2017-01-01 DIAGNOSIS — L97529 Non-pressure chronic ulcer of other part of left foot with unspecified severity: Secondary | ICD-10-CM

## 2017-01-01 DIAGNOSIS — E1165 Type 2 diabetes mellitus with hyperglycemia: Secondary | ICD-10-CM

## 2017-01-01 DIAGNOSIS — IMO0002 Reserved for concepts with insufficient information to code with codable children: Secondary | ICD-10-CM

## 2017-01-01 DIAGNOSIS — E1140 Type 2 diabetes mellitus with diabetic neuropathy, unspecified: Secondary | ICD-10-CM

## 2017-01-01 DIAGNOSIS — L909 Atrophic disorder of skin, unspecified: Secondary | ICD-10-CM

## 2017-01-01 DIAGNOSIS — M216X9 Other acquired deformities of unspecified foot: Secondary | ICD-10-CM

## 2017-01-01 DIAGNOSIS — E11621 Type 2 diabetes mellitus with foot ulcer: Secondary | ICD-10-CM

## 2017-01-01 DIAGNOSIS — L84 Corns and callosities: Secondary | ICD-10-CM

## 2017-01-01 DIAGNOSIS — R29898 Other symptoms and signs involving the musculoskeletal system: Secondary | ICD-10-CM | POA: Diagnosis not present

## 2017-01-01 NOTE — Progress Notes (Signed)
Subjective: Cindy Hahn is a 82 y.o. female patient seen in office for evaluation of ulceration of her left foot and painful callus. Patient is assisted by daughter-in-law this visit. Patient has a history of diabetes and dementia and a history of falls. Patient's daughter is changing the dressing using iodoform packing at home. Daughter is concerned that the heel wound is just not healing. Denies nausea/fever/vomiting/chills/night sweats/shortness of breath. Admits pain and continued drainage from left  heel. Patient has no other pedal complaints at this time.  There are no active problems to display for this patient.  Current Outpatient Prescriptions on File Prior to Visit  Medication Sig Dispense Refill  . ALPRAZolam (XANAX) 1 MG tablet Take 1 mg by mouth.    Marland Kitchen amoxicillin-clavulanate (AUGMENTIN) 875-125 MG tablet Take 1 tablet by mouth 2 (two) times daily. 28 tablet 0  . anastrozole (ARIMIDEX) 1 MG tablet take 1 tablet by mouth once daily to [place].  0  . aspirin EC 81 MG tablet Take 81 mg by mouth.    . digoxin (LANOXIN) 0.125 MG tablet   0  . diltiazem (CARDIZEM) 60 MG tablet     . LEVEMIR 100 UNIT/ML injection     . metoprolol tartrate (LOPRESSOR) 50 MG tablet take 1 and 1/2 tablet by mouth twice a day  0  . NOVOLOG FLEXPEN 100 UNIT/ML FlexPen inject 10 units SUBCUTANEOUSLY WITH MAIN MEAL  0  . potassium chloride SA (K-DUR,KLOR-CON) 20 MEQ tablet Take 40 mEq by mouth daily.  0  . rosuvastatin (CRESTOR) 20 MG tablet   0  . sertraline (ZOLOFT) 100 MG tablet     . sulfamethoxazole-trimethoprim (BACTRIM DS,SEPTRA DS) 800-160 MG tablet Take 1 tablet by mouth 2 (two) times daily. 28 tablet 0   No current facility-administered medications on file prior to visit.    Allergies  Allergen Reactions  . Levofloxacin Other (See Comments)    confusion    No results found for this or any previous visit (from the past 2160 hour(s)).  Objective: There were no  vitals filed for this visit.  General: Patient is awake, alert, oriented x 3 and in no acute distress.  Dermatology: Skin is warm and dry bilateral with a full thickness ulceration present Left central heel. Ulceration measures 1cm x 1cm x 2.0 cm deeper compared to previous. There is a mildly macerated border with a granular base. The ulceration does not probe to bone. There is no active drainage, however on inner gauze layer. There is a yellow drainage, no malodor, no erythema, no edema. No other acute signs of infection.  Pre-ulcerative callus sub met 1 bilateral and sub met 5 bilateral.   Vascular: Dorsalis Pedis pulse = 1/4 Bilateral,  Posterior Tibial pulse = 1/4 Bilateral,  Capillary Fill Time < 5 seconds  Neurologic: Protective sensation absent to the level of ankles using the 5.07/10g [employer].  Musculosketal:  No Pain with palpation to ulcerated area at left heel. No pain with compression to calves bilateral. + prominent metatarsals with fat pad atrophy deformities noted bilateral.  No results for input(s): GRAMSTAIN, LABORGA in the last 8760 hours.  Assessment and Plan:  Problem List Items Addressed This Visit    None    Visit Diagnoses    Chronic diabetic ulcer of left foot determined by examination ([place])    -  Primary   Prominent metatarsal head, unspecified laterality       Pre-ulcerative calluses  Fat pad atrophy of foot       Uncontrolled type 2 diabetes with neuropathy ([place])         -Examined patient and discussed the progression of the wound on left heel and callus and treatment alternatives. - Nonselectively with moist gauze debrided ulceration on left heel to healthy bleeding borders -Applied iodoform packing plantar heel and offloading pads and instructed patient to continue with daily dressings at home consisting of same with help of daughter - continue withostoperative shoes to wear bilateral - Antibiotics completed -Awaiting CT scan to  evaluate if there is any underlying changes to her bone -Advised patient to go to the ER or return to office if the wound worsens or if constitutional symptoms are present. -Patient to return to office in 2 weeks after her CT scan  for follow up care and evaluation or sooner if problems arise. Will consider wound graft (Theraskin) if CT is negative.   Landis Martins, DPM

## 2017-01-04 NOTE — Telephone Encounter (Addendum)
-----   Message from Landis Martins, [place] sent at 01/01/2017  4:54 PM EDT ----- Regarding: Cindy Hahn for left heel ulceration that measures 1x1x2.5cm. Patient is going for CT scan on 01-15-17 and if negative for bone infection then we will graft her Thanks Dr. Cannon Kettle. 01/04/2017-Faxed required form, clinicals and demographics to [facility].01/07/2017-Received [facility] Benefit Verification Response and [place] reviewed states pt is covered in total.

## 2017-01-07 NOTE — Telephone Encounter (Signed)
Ok her appt is on 01-20-17 so when you return should be enough time to order the graft for it to arrive on time. I will need the 2x3in size -Dr. Cannon Kettle

## 2017-01-08 ENCOUNTER — Telehealth: Payer: Self-pay | Admitting: *Deleted

## 2017-01-08 NOTE — Telephone Encounter (Addendum)
-----   Message from Landis Martins, [place] sent at 01/08/2017  8:33 AM EDT ----- Lovette Cliche [email or web address]  ----- Message ----- From: Andres Ege, RN Sent: 01/08/2017   8:26 AM To: Landis Martins, DPM  Dr. Cannon Kettle, is that a 2 x 3 inch or cm? Valery.01/19/2017-Faxed request for copy of CT Scan results performed yesterday to [facility]. Dr. Cannon Kettle reviewed CT results states no evidence of osteomyelitis and ordered TheraSkin to be ordered for placement tomorrow. I informed T. Cherene Altes, he states he will have the order in the [place] office tomorrow. ORDERED 1ST THERASKIN 102TSL, 2.0" X 3.0" $1295.00 BY EMAIL TO [email or web address].

## 2017-01-18 DIAGNOSIS — L97521 Non-pressure chronic ulcer of other part of left foot limited to breakdown of skin: Secondary | ICD-10-CM | POA: Diagnosis not present

## 2017-01-18 DIAGNOSIS — M86679 Other chronic osteomyelitis, unspecified ankle and foot: Secondary | ICD-10-CM | POA: Diagnosis not present

## 2017-01-19 ENCOUNTER — Encounter: Payer: Self-pay | Admitting: Sports Medicine

## 2017-01-20 ENCOUNTER — Encounter (INDEPENDENT_AMBULATORY_CARE_PROVIDER_SITE_OTHER): Payer: Self-pay

## 2017-01-20 ENCOUNTER — Ambulatory Visit (INDEPENDENT_AMBULATORY_CARE_PROVIDER_SITE_OTHER): Payer: Medicare Other | Admitting: Sports Medicine

## 2017-01-20 DIAGNOSIS — L97529 Non-pressure chronic ulcer of other part of left foot with unspecified severity: Secondary | ICD-10-CM

## 2017-01-20 DIAGNOSIS — E1140 Type 2 diabetes mellitus with diabetic neuropathy, unspecified: Secondary | ICD-10-CM

## 2017-01-20 DIAGNOSIS — L97422 Non-pressure chronic ulcer of left heel and midfoot with fat layer exposed: Secondary | ICD-10-CM

## 2017-01-20 DIAGNOSIS — E11621 Type 2 diabetes mellitus with foot ulcer: Secondary | ICD-10-CM

## 2017-01-20 DIAGNOSIS — IMO0002 Reserved for concepts with insufficient information to code with codable children: Secondary | ICD-10-CM

## 2017-01-20 DIAGNOSIS — E1165 Type 2 diabetes mellitus with hyperglycemia: Secondary | ICD-10-CM

## 2017-01-20 DIAGNOSIS — L84 Corns and callosities: Secondary | ICD-10-CM

## 2017-01-20 DIAGNOSIS — M216X9 Other acquired deformities of unspecified foot: Secondary | ICD-10-CM

## 2017-01-20 NOTE — Progress Notes (Signed)
Subjective: Cindy Hahn is a 82 y.o. female patient seen in office for evaluation of ulceration of her left foot and painful callus. Patient is assisted by daughter-in-law this visit. Patient has a history of diabetes and dementia and a history of falls. Patient's daughter is changing the dressing using iodoform packing at home. Daughter reports that they had CT scan. Denies nausea/fever/vomiting/chills/night sweats/shortness of breath. Patient has no other pedal complaints at this time.  There are no active problems to display for this patient.  Current Outpatient Prescriptions on File Prior to Visit  Medication Sig Dispense Refill  . ALPRAZolam (XANAX) 1 MG tablet Take 1 mg by mouth.    Marland Kitchen amoxicillin-clavulanate (AUGMENTIN) 875-125 MG tablet Take 1 tablet by mouth 2 (two) times daily. 28 tablet 0  . anastrozole (ARIMIDEX) 1 MG tablet take 1 tablet by mouth once daily to [place].  0  . aspirin EC 81 MG tablet Take 81 mg by mouth.    . digoxin (LANOXIN) 0.125 MG tablet   0  . diltiazem (CARDIZEM) 60 MG tablet     . LEVEMIR 100 UNIT/ML injection     . metoprolol tartrate (LOPRESSOR) 50 MG tablet take 1 and 1/2 tablet by mouth twice a day  0  . NOVOLOG FLEXPEN 100 UNIT/ML FlexPen inject 10 units SUBCUTANEOUSLY WITH MAIN MEAL  0  . potassium chloride SA (K-DUR,KLOR-CON) 20 MEQ tablet Take 40 mEq by mouth daily.  0  . rosuvastatin (CRESTOR) 20 MG tablet   0  . sertraline (ZOLOFT) 100 MG tablet     . sulfamethoxazole-trimethoprim (BACTRIM DS,SEPTRA DS) 800-160 MG tablet Take 1 tablet by mouth 2 (two) times daily. 28 tablet 0   No current facility-administered medications on file prior to visit.    Allergies  Allergen Reactions  . Levofloxacin Other (See Comments)    confusion    No results found for this or any previous visit (from the past 2160 hour(s)).  Objective: There were no vitals filed for this visit.  General: Patient is awake, alert, oriented x 3  and in no acute distress.  Dermatology: Skin is warm and dry bilateral with a full thickness ulceration present Left central heel. Ulceration measures 1cm x 1cm x 1.0 cm. There is a mildly macerated border with a granular base. The ulceration does not probe to bone. There is no active drainage, however on inner gauze layer. There is a yellow drainage, no malodor, no erythema, no edema. No other acute signs of infection.  Pre-ulcerative callus sub met 1 bilateral and sub met 5 bilateral.   Vascular: Dorsalis Pedis pulse = 1/4 Bilateral,  Posterior Tibial pulse = 1/4 Bilateral,  Capillary Fill Time < 5 seconds  Neurologic: Protective sensation absent to the level of ankles using the 5.07/10g [employer].  Musculosketal:  No Pain with palpation to ulcerated area at left heel. No pain with compression to calves bilateral. + prominent metatarsals with fat pad atrophy deformities noted bilateral.  No results for input(s): GRAMSTAIN, LABORGA in the last 8760 hours.  Assessment and Plan:  Problem List Items Addressed This Visit    None    Visit Diagnoses    Heel ulceration, left, with fat layer exposed ([place])    -  Primary   Chronic diabetic ulcer of left foot determined by examination ([place])       Prominent metatarsal head, unspecified laterality       Pre-ulcerative calluses       Uncontrolled type  2 diabetes with neuropathy ([place])         -Examined patient and discussed the progression of the wound on left heel and callus and treatment alternatives. -CT results reviewed; Negative for osteomyelitis  - Excisionally dedbrided ulceration at left heel to healthy bleeding borders removing nonviable tissue using a sterile chisel blade. Wound measures post debridement as above. Wound was debrided to the level of the dermis with viable wound base exposed to promote healing. Hemostasis was achieved with manuel pressure. Patient tolerated procedure well without any 3cc of local anesthesia  necessary for this wound debridement. Then after applied Theraskin 2x3in [phone], Code 102TSL, Exp 08-18-2020 secured with sutures without waste covered with steri-strips, adaptic, 4x4, abd pad, kerlix and coban.  -Rx Home Nursing from [facility] for outer dressing changes of the adaptic and steristrips to the site covered with dry dressing  - Advised patient to go to the ER or return to office if the wound worsens or if constitutional symptoms are present. -Continue with postoperative shoes to wear bilateral. Advised patient to use walker or wheelchair to help her keep pressure off left heel. Advised patient to only be on her feet for bathroom or meals and nothing else.  -Patient to return to office in 2 weeks for possible reapplication of Theraskin.   Landis Martins, DPM

## 2017-01-21 ENCOUNTER — Telehealth: Payer: Self-pay | Admitting: *Deleted

## 2017-01-21 NOTE — Telephone Encounter (Addendum)
-----   Message from Landis Martins, [place] sent at 01/20/2017  5:47 PM EDT ----- Regarding: Wound care nursing Left foot: [place] Left foot patient has graft sutured on to the heel so please re-apply steristrips and adaptic twice weekly covered with dry dressing. Patient to follow up in office for possible reapplication of graft in 2 weeks  FYI Patient has a history of dementia and has dogs at home. 01/21/2017-Faxed copy of Dr. Leeanne Rio 01/20/2017 orders to [facility]. Emailed request for [employer] to [employer], for application on 50/93/2671.

## 2017-01-22 ENCOUNTER — Telehealth: Payer: Self-pay | Admitting: Sports Medicine

## 2017-01-22 DIAGNOSIS — Z932 Ileostomy status: Secondary | ICD-10-CM | POA: Diagnosis not present

## 2017-01-22 DIAGNOSIS — E1140 Type 2 diabetes mellitus with diabetic neuropathy, unspecified: Secondary | ICD-10-CM | POA: Diagnosis not present

## 2017-01-22 DIAGNOSIS — L97429 Non-pressure chronic ulcer of left heel and midfoot with unspecified severity: Secondary | ICD-10-CM | POA: Diagnosis not present

## 2017-01-22 DIAGNOSIS — F0390 Unspecified dementia without behavioral disturbance: Secondary | ICD-10-CM | POA: Diagnosis not present

## 2017-01-22 DIAGNOSIS — Z9181 History of falling: Secondary | ICD-10-CM | POA: Diagnosis not present

## 2017-01-22 DIAGNOSIS — Z48817 Encounter for surgical aftercare following surgery on the skin and subcutaneous tissue: Secondary | ICD-10-CM | POA: Diagnosis not present

## 2017-01-22 DIAGNOSIS — E11621 Type 2 diabetes mellitus with foot ulcer: Secondary | ICD-10-CM | POA: Diagnosis not present

## 2017-01-22 DIAGNOSIS — L97421 Non-pressure chronic ulcer of left heel and midfoot limited to breakdown of skin: Secondary | ICD-10-CM | POA: Diagnosis not present

## 2017-01-22 DIAGNOSIS — Z794 Long term (current) use of insulin: Secondary | ICD-10-CM | POA: Diagnosis not present

## 2017-01-22 NOTE — Telephone Encounter (Signed)
Ok thanks 

## 2017-01-22 NOTE — Telephone Encounter (Signed)
Darlene with [facility] called stating they got pt started today for home wound care twice a week and needs orders. If calling back today, please call [phone] but if unable to call until Monday, please call the main office at [phone] and the contact person is Nicolette. Fax number is [phone].

## 2017-01-22 NOTE — Telephone Encounter (Signed)
I informed Cindy Hahn to leave graft in place and reapply steristrips and adaptic 2 times a week, and cover with dry sterile dressing. Cindy Hahn states the were there today and there was a mesh affixed to the graft so she left it, and will see pt again Tuesday. I told her that would be fine.

## 2017-01-25 DIAGNOSIS — Z48817 Encounter for surgical aftercare following surgery on the skin and subcutaneous tissue: Secondary | ICD-10-CM | POA: Diagnosis not present

## 2017-01-25 DIAGNOSIS — F0390 Unspecified dementia without behavioral disturbance: Secondary | ICD-10-CM | POA: Diagnosis not present

## 2017-01-25 DIAGNOSIS — E11621 Type 2 diabetes mellitus with foot ulcer: Secondary | ICD-10-CM | POA: Diagnosis not present

## 2017-01-25 DIAGNOSIS — E1140 Type 2 diabetes mellitus with diabetic neuropathy, unspecified: Secondary | ICD-10-CM | POA: Diagnosis not present

## 2017-01-25 DIAGNOSIS — L97429 Non-pressure chronic ulcer of left heel and midfoot with unspecified severity: Secondary | ICD-10-CM | POA: Diagnosis not present

## 2017-01-25 DIAGNOSIS — L97421 Non-pressure chronic ulcer of left heel and midfoot limited to breakdown of skin: Secondary | ICD-10-CM | POA: Diagnosis not present

## 2017-01-26 ENCOUNTER — Telehealth: Payer: Self-pay | Admitting: *Deleted

## 2017-01-26 DIAGNOSIS — L97429 Non-pressure chronic ulcer of left heel and midfoot with unspecified severity: Secondary | ICD-10-CM | POA: Diagnosis not present

## 2017-01-26 DIAGNOSIS — E11621 Type 2 diabetes mellitus with foot ulcer: Secondary | ICD-10-CM | POA: Diagnosis not present

## 2017-01-26 DIAGNOSIS — E1140 Type 2 diabetes mellitus with diabetic neuropathy, unspecified: Secondary | ICD-10-CM | POA: Diagnosis not present

## 2017-01-26 DIAGNOSIS — F0390 Unspecified dementia without behavioral disturbance: Secondary | ICD-10-CM | POA: Diagnosis not present

## 2017-01-26 DIAGNOSIS — Z48817 Encounter for surgical aftercare following surgery on the skin and subcutaneous tissue: Secondary | ICD-10-CM | POA: Diagnosis not present

## 2017-01-26 DIAGNOSIS — L97421 Non-pressure chronic ulcer of left heel and midfoot limited to breakdown of skin: Secondary | ICD-10-CM | POA: Diagnosis not present

## 2017-01-26 NOTE — Telephone Encounter (Signed)
Cindy Hahn - [facility] states has wound care dressing orders, but does not have Adaptic, but does have oil-emulsion dressing. Dr. Cannon Kettle states oil-emulsion dressing would be fine to place over the graft, it is the same as vaseline gauze. I informed Cindy Hahn.

## 2017-01-28 DIAGNOSIS — E1140 Type 2 diabetes mellitus with diabetic neuropathy, unspecified: Secondary | ICD-10-CM | POA: Diagnosis not present

## 2017-01-28 DIAGNOSIS — Z48817 Encounter for surgical aftercare following surgery on the skin and subcutaneous tissue: Secondary | ICD-10-CM | POA: Diagnosis not present

## 2017-01-28 DIAGNOSIS — L97421 Non-pressure chronic ulcer of left heel and midfoot limited to breakdown of skin: Secondary | ICD-10-CM | POA: Diagnosis not present

## 2017-01-28 DIAGNOSIS — L97429 Non-pressure chronic ulcer of left heel and midfoot with unspecified severity: Secondary | ICD-10-CM | POA: Diagnosis not present

## 2017-01-28 DIAGNOSIS — F0390 Unspecified dementia without behavioral disturbance: Secondary | ICD-10-CM | POA: Diagnosis not present

## 2017-01-28 DIAGNOSIS — E11621 Type 2 diabetes mellitus with foot ulcer: Secondary | ICD-10-CM | POA: Diagnosis not present

## 2017-01-29 ENCOUNTER — Telehealth: Payer: Self-pay | Admitting: Sports Medicine

## 2017-01-29 DIAGNOSIS — E11621 Type 2 diabetes mellitus with foot ulcer: Secondary | ICD-10-CM | POA: Diagnosis not present

## 2017-01-29 DIAGNOSIS — L97421 Non-pressure chronic ulcer of left heel and midfoot limited to breakdown of skin: Secondary | ICD-10-CM | POA: Diagnosis not present

## 2017-01-29 DIAGNOSIS — L97429 Non-pressure chronic ulcer of left heel and midfoot with unspecified severity: Secondary | ICD-10-CM | POA: Diagnosis not present

## 2017-01-29 DIAGNOSIS — F0390 Unspecified dementia without behavioral disturbance: Secondary | ICD-10-CM | POA: Diagnosis not present

## 2017-01-29 DIAGNOSIS — E1140 Type 2 diabetes mellitus with diabetic neuropathy, unspecified: Secondary | ICD-10-CM | POA: Diagnosis not present

## 2017-01-29 DIAGNOSIS — Z48817 Encounter for surgical aftercare following surgery on the skin and subcutaneous tissue: Secondary | ICD-10-CM | POA: Diagnosis not present

## 2017-01-29 NOTE — Telephone Encounter (Signed)
This is Page Spiro, physical therapist calling from [facility]. Calling to get approval to continue physical therapy twice a week for three weeks.  Please call me back at [phone].

## 2017-01-29 NOTE — Telephone Encounter (Addendum)
Left message informing Cindy Hahn, Dr. Leeanne Rio 01/20/2017 Plan states only to be on the foot to go to the bathroom and meals, surgical shoes B/L, and off load left heel when walking. I told Cindy Hahn if they could do upper extremity strengthening and safe transfer training that would be ordered otherwise no lower extremity PT. Cindy Hahn called again and states pt is not going to stay off foot and they would like PT for upper body and a wheelchair order. I informed Cindy Hahn that a wheelchair would be fine, and upper body strength training, but not to do lower extremity. Cindy Hahn states she will send order for wheelchair.

## 2017-02-02 DIAGNOSIS — E11621 Type 2 diabetes mellitus with foot ulcer: Secondary | ICD-10-CM | POA: Diagnosis not present

## 2017-02-02 DIAGNOSIS — L97429 Non-pressure chronic ulcer of left heel and midfoot with unspecified severity: Secondary | ICD-10-CM | POA: Diagnosis not present

## 2017-02-02 DIAGNOSIS — F0390 Unspecified dementia without behavioral disturbance: Secondary | ICD-10-CM | POA: Diagnosis not present

## 2017-02-02 DIAGNOSIS — E1140 Type 2 diabetes mellitus with diabetic neuropathy, unspecified: Secondary | ICD-10-CM | POA: Diagnosis not present

## 2017-02-02 DIAGNOSIS — Z48817 Encounter for surgical aftercare following surgery on the skin and subcutaneous tissue: Secondary | ICD-10-CM | POA: Diagnosis not present

## 2017-02-02 DIAGNOSIS — L97421 Non-pressure chronic ulcer of left heel and midfoot limited to breakdown of skin: Secondary | ICD-10-CM | POA: Diagnosis not present

## 2017-02-04 ENCOUNTER — Ambulatory Visit (INDEPENDENT_AMBULATORY_CARE_PROVIDER_SITE_OTHER): Payer: Medicare Other | Admitting: Sports Medicine

## 2017-02-04 DIAGNOSIS — E11621 Type 2 diabetes mellitus with foot ulcer: Secondary | ICD-10-CM

## 2017-02-04 DIAGNOSIS — IMO0002 Reserved for concepts with insufficient information to code with codable children: Secondary | ICD-10-CM

## 2017-02-04 DIAGNOSIS — L97422 Non-pressure chronic ulcer of left heel and midfoot with fat layer exposed: Secondary | ICD-10-CM

## 2017-02-04 DIAGNOSIS — E1140 Type 2 diabetes mellitus with diabetic neuropathy, unspecified: Secondary | ICD-10-CM

## 2017-02-04 DIAGNOSIS — Z23 Encounter for immunization: Secondary | ICD-10-CM | POA: Diagnosis not present

## 2017-02-04 DIAGNOSIS — L84 Corns and callosities: Secondary | ICD-10-CM

## 2017-02-04 DIAGNOSIS — M216X9 Other acquired deformities of unspecified foot: Secondary | ICD-10-CM

## 2017-02-04 DIAGNOSIS — E1165 Type 2 diabetes mellitus with hyperglycemia: Secondary | ICD-10-CM

## 2017-02-04 DIAGNOSIS — L97529 Non-pressure chronic ulcer of other part of left foot with unspecified severity: Secondary | ICD-10-CM

## 2017-02-04 NOTE — Progress Notes (Signed)
Subjective: Cindy Hahn is a 82 y.o. female patient seen in office for evaluation of ulceration of her left foot afterTheraskin application # 1 was performed 2 weeks ago and painful callus. Patient is assisted by daughter-in-law this visit. Patient has a history of diabetes and dementia and a history of falls. Patient's has nursing changing dressing 2 times per week. Reports that nurses have been putting extra padding around her fourth and fifth toes on the left, which seems to help and that she has no pain . Denies nausea/fever/vomiting/chills/night sweats/shortness of breath. Patient has no other pedal complaints at this time.  There are no active problems to display for this patient.  Current Outpatient Prescriptions on File Prior to Visit  Medication Sig Dispense Refill  . ALPRAZolam (XANAX) 1 MG tablet Take 1 mg by mouth.    Marland Kitchen amoxicillin-clavulanate (AUGMENTIN) 875-125 MG tablet Take 1 tablet by mouth 2 (two) times daily. 28 tablet 0  . anastrozole (ARIMIDEX) 1 MG tablet take 1 tablet by mouth once daily to [place].  0  . aspirin EC 81 MG tablet Take 81 mg by mouth.    . digoxin (LANOXIN) 0.125 MG tablet   0  . diltiazem (CARDIZEM) 60 MG tablet     . LEVEMIR 100 UNIT/ML injection     . metoprolol tartrate (LOPRESSOR) 50 MG tablet take 1 and 1/2 tablet by mouth twice a day  0  . NOVOLOG FLEXPEN 100 UNIT/ML FlexPen inject 10 units SUBCUTANEOUSLY WITH MAIN MEAL  0  . potassium chloride SA (K-DUR,KLOR-CON) 20 MEQ tablet Take 40 mEq by mouth daily.  0  . rosuvastatin (CRESTOR) 20 MG tablet   0  . sertraline (ZOLOFT) 100 MG tablet     . sulfamethoxazole-trimethoprim (BACTRIM DS,SEPTRA DS) 800-160 MG tablet Take 1 tablet by mouth 2 (two) times daily. 28 tablet 0   No current facility-administered medications on file prior to visit.    Allergies  Allergen Reactions  . Levofloxacin Other (See Comments)    confusion    No results found for this or any previous  visit (from the past 2160 hour(s)).  Objective: There were no vitals filed for this visit.  General: Patient is awake, alert, oriented x 3 and in no acute distress.  Dermatology: Skin is warm and dry bilateral with a full thickness ulceration present Left central heel. Ulceration measures 0.6cm x 0.8cm x 0.5 cm. There is a mildly macerated border with a granular base. The ulceration does not probe to bone. There is no active drainage, however on inner gauze layer. There is a yellow drainage, scant malodor, no erythema, no edema. No other acute signs of infection.  Pre-ulcerative callus sub met 1 bilateral and sub met 5 bilateral.   Vascular: Dorsalis Pedis pulse = 1/4 Bilateral,  Posterior Tibial pulse = 1/4 Bilateral,  Capillary Fill Time < 5 seconds  Neurologic: Protective sensation absent to the level of ankles using the 5.07/10g [employer].  Musculosketal:  No Pain with palpation to ulcerated area at left heel. No pain with compression to calves bilateral. + prominent metatarsals with fat pad atrophy deformities noted bilateral.  No results for input(s): GRAMSTAIN, LABORGA in the last 8760 hours.  Assessment and Plan:  Problem List Items Addressed This Visit    None    Visit Diagnoses    Heel ulceration, left, with fat layer exposed ([place])    -  Primary   Chronic diabetic ulcer of left foot determined by examination (  [place])       Prominent metatarsal head, unspecified laterality       Pre-ulcerative calluses       Uncontrolled type 2 diabetes with neuropathy ([place])         -Examined patient and Re-discussed the progression of the wound on left heel and callus and treatment alternatives. - Excisionally dedbrided ulceration at left heel to healthy bleeding borders removing nonviable tissue using a sterile chisel blade. Wound measures post debridement as above. Wound was debrided to the level of the dermis with viable wound base exposed to promote healing. Hemostasis  was achieved with manuel pressure. Patient tolerated procedure well with 3cc of local anesthesia necessary for this wound debridement. Then after applied Theraskin 2x3in ID [id number], Code 102TSL, Exp 08-28-2020 secured with sutures with half of the graft wasted covered with steri-strips, adaptic, 4x4, abd pad, kerlix and coban. This is graft #2 to site. -Rx Home Nursing from [facility] for outer dressing changes of the adaptic and steristrips to the site covered with dry dressing twice weekly - Advised patient to go to the ER or return to office if the wound worsens or if constitutional symptoms are present. -Continue with postoperative shoes to wear bilateral. Advised patient to use walker or wheelchair to help her keep pressure off left heel. Advised patient to only be on her feet for bathroom or meals and nothing else.  -Patient to return to office in 2 weeks for possible reapplication of Theraskin.   Landis Martins, DPM

## 2017-02-05 ENCOUNTER — Telehealth: Payer: Self-pay | Admitting: *Deleted

## 2017-02-05 ENCOUNTER — Telehealth: Payer: Self-pay | Admitting: Sports Medicine

## 2017-02-05 DIAGNOSIS — Z48817 Encounter for surgical aftercare following surgery on the skin and subcutaneous tissue: Secondary | ICD-10-CM | POA: Diagnosis not present

## 2017-02-05 DIAGNOSIS — L97429 Non-pressure chronic ulcer of left heel and midfoot with unspecified severity: Secondary | ICD-10-CM | POA: Diagnosis not present

## 2017-02-05 DIAGNOSIS — F0390 Unspecified dementia without behavioral disturbance: Secondary | ICD-10-CM | POA: Diagnosis not present

## 2017-02-05 DIAGNOSIS — L97421 Non-pressure chronic ulcer of left heel and midfoot limited to breakdown of skin: Secondary | ICD-10-CM | POA: Diagnosis not present

## 2017-02-05 DIAGNOSIS — E1140 Type 2 diabetes mellitus with diabetic neuropathy, unspecified: Secondary | ICD-10-CM | POA: Diagnosis not present

## 2017-02-05 DIAGNOSIS — E11621 Type 2 diabetes mellitus with foot ulcer: Secondary | ICD-10-CM | POA: Diagnosis not present

## 2017-02-05 NOTE — Telephone Encounter (Addendum)
I informed Estill Bamberg - [facility] of Dr. Leeanne Rio 1:19pm orders.

## 2017-02-05 NOTE — Telephone Encounter (Signed)
Yes this is OK. I saw patient yesterday so the next dressing change should be on Monday -Dr. Chauncey Cruel

## 2017-02-05 NOTE — Telephone Encounter (Addendum)
-----   Message from Landis Martins, [place] sent at 02/04/2017  5:34 PM EDT ----- Regarding: Theraskin #3 Order for next visit The new rep for Theraskin is Inis Sizer [phone]. Dorothea Ogle is moving to [facility]. 02/05/2017-Emailed order to Inis Sizer [facility] 2nd order for [id number] 102TSL 2.0 x 3.0 $3968.86 for application on 48/47/2072 at 3:30pm.

## 2017-02-05 NOTE — Telephone Encounter (Signed)
This is Lavina Hamman with [facility] calling to inform Dr. Cannon Kettle of a missed visit on this pt. I called to go out, and the daughter requested I wait and come out next week. Stated she saw the doctor yesterday and will do the dressing changes today. Stated she wants [place] to come out on Monday's and Thursday's. I am required to let the doctor know of any missed appointments. If you have any question, you can reach me at [phone].

## 2017-02-08 DIAGNOSIS — E11621 Type 2 diabetes mellitus with foot ulcer: Secondary | ICD-10-CM | POA: Diagnosis not present

## 2017-02-08 DIAGNOSIS — E1140 Type 2 diabetes mellitus with diabetic neuropathy, unspecified: Secondary | ICD-10-CM | POA: Diagnosis not present

## 2017-02-08 DIAGNOSIS — Z48817 Encounter for surgical aftercare following surgery on the skin and subcutaneous tissue: Secondary | ICD-10-CM | POA: Diagnosis not present

## 2017-02-08 DIAGNOSIS — L97421 Non-pressure chronic ulcer of left heel and midfoot limited to breakdown of skin: Secondary | ICD-10-CM | POA: Diagnosis not present

## 2017-02-08 DIAGNOSIS — F0390 Unspecified dementia without behavioral disturbance: Secondary | ICD-10-CM | POA: Diagnosis not present

## 2017-02-08 DIAGNOSIS — L97429 Non-pressure chronic ulcer of left heel and midfoot with unspecified severity: Secondary | ICD-10-CM | POA: Diagnosis not present

## 2017-02-09 DIAGNOSIS — Z48817 Encounter for surgical aftercare following surgery on the skin and subcutaneous tissue: Secondary | ICD-10-CM | POA: Diagnosis not present

## 2017-02-09 DIAGNOSIS — L97429 Non-pressure chronic ulcer of left heel and midfoot with unspecified severity: Secondary | ICD-10-CM | POA: Diagnosis not present

## 2017-02-09 DIAGNOSIS — F0390 Unspecified dementia without behavioral disturbance: Secondary | ICD-10-CM | POA: Diagnosis not present

## 2017-02-09 DIAGNOSIS — L97421 Non-pressure chronic ulcer of left heel and midfoot limited to breakdown of skin: Secondary | ICD-10-CM | POA: Diagnosis not present

## 2017-02-09 DIAGNOSIS — E11621 Type 2 diabetes mellitus with foot ulcer: Secondary | ICD-10-CM | POA: Diagnosis not present

## 2017-02-09 DIAGNOSIS — E1140 Type 2 diabetes mellitus with diabetic neuropathy, unspecified: Secondary | ICD-10-CM | POA: Diagnosis not present

## 2017-02-10 ENCOUNTER — Telehealth: Payer: Self-pay | Admitting: *Deleted

## 2017-02-10 NOTE — Telephone Encounter (Addendum)
-----   Message from Landis Martins, [place] sent at 02/09/2017  1:06 PM EDT ----- Regarding: Reorder Theraskin  Reorder for next visit Thanks Dr. Cannon Kettle.02/10/2017-Emailed order to [place] 102TSL 2.0 x 3.0cm $3545.62 for 3rd application on 56/38/9373 3:30pm in the [place] office.

## 2017-02-11 DIAGNOSIS — L97421 Non-pressure chronic ulcer of left heel and midfoot limited to breakdown of skin: Secondary | ICD-10-CM | POA: Diagnosis not present

## 2017-02-11 DIAGNOSIS — L97429 Non-pressure chronic ulcer of left heel and midfoot with unspecified severity: Secondary | ICD-10-CM | POA: Diagnosis not present

## 2017-02-11 DIAGNOSIS — E11621 Type 2 diabetes mellitus with foot ulcer: Secondary | ICD-10-CM | POA: Diagnosis not present

## 2017-02-11 DIAGNOSIS — F0390 Unspecified dementia without behavioral disturbance: Secondary | ICD-10-CM | POA: Diagnosis not present

## 2017-02-11 DIAGNOSIS — Z48817 Encounter for surgical aftercare following surgery on the skin and subcutaneous tissue: Secondary | ICD-10-CM | POA: Diagnosis not present

## 2017-02-11 DIAGNOSIS — E1140 Type 2 diabetes mellitus with diabetic neuropathy, unspecified: Secondary | ICD-10-CM | POA: Diagnosis not present

## 2017-02-12 DIAGNOSIS — C50919 Malignant neoplasm of unspecified site of unspecified female breast: Secondary | ICD-10-CM | POA: Diagnosis not present

## 2017-02-12 DIAGNOSIS — F0390 Unspecified dementia without behavioral disturbance: Secondary | ICD-10-CM | POA: Diagnosis not present

## 2017-02-12 DIAGNOSIS — Z79811 Long term (current) use of aromatase inhibitors: Secondary | ICD-10-CM | POA: Diagnosis not present

## 2017-02-12 DIAGNOSIS — Z170 Estrogen receptor positive status [ER+]: Secondary | ICD-10-CM | POA: Diagnosis not present

## 2017-02-12 DIAGNOSIS — L97421 Non-pressure chronic ulcer of left heel and midfoot limited to breakdown of skin: Secondary | ICD-10-CM | POA: Diagnosis not present

## 2017-02-12 DIAGNOSIS — Z853 Personal history of malignant neoplasm of breast: Secondary | ICD-10-CM | POA: Diagnosis not present

## 2017-02-12 DIAGNOSIS — Z48817 Encounter for surgical aftercare following surgery on the skin and subcutaneous tissue: Secondary | ICD-10-CM | POA: Diagnosis not present

## 2017-02-12 DIAGNOSIS — E1140 Type 2 diabetes mellitus with diabetic neuropathy, unspecified: Secondary | ICD-10-CM | POA: Diagnosis not present

## 2017-02-12 DIAGNOSIS — E11621 Type 2 diabetes mellitus with foot ulcer: Secondary | ICD-10-CM | POA: Diagnosis not present

## 2017-02-12 DIAGNOSIS — L97429 Non-pressure chronic ulcer of left heel and midfoot with unspecified severity: Secondary | ICD-10-CM | POA: Diagnosis not present

## 2017-02-12 NOTE — Telephone Encounter (Signed)
Cindy Hahn - [facility] states pt still doesn't have the wheelchair. I left message informing Cindy Hahn that my notes said she was going to order the wheelchair. I told Cindy Hahn, that if she needed script written for wheelchair I could write it and fax to her if she left a fax.

## 2017-02-15 DIAGNOSIS — E11621 Type 2 diabetes mellitus with foot ulcer: Secondary | ICD-10-CM | POA: Diagnosis not present

## 2017-02-15 DIAGNOSIS — Z48817 Encounter for surgical aftercare following surgery on the skin and subcutaneous tissue: Secondary | ICD-10-CM | POA: Diagnosis not present

## 2017-02-15 DIAGNOSIS — L97421 Non-pressure chronic ulcer of left heel and midfoot limited to breakdown of skin: Secondary | ICD-10-CM | POA: Diagnosis not present

## 2017-02-15 DIAGNOSIS — L97429 Non-pressure chronic ulcer of left heel and midfoot with unspecified severity: Secondary | ICD-10-CM | POA: Diagnosis not present

## 2017-02-15 DIAGNOSIS — E1140 Type 2 diabetes mellitus with diabetic neuropathy, unspecified: Secondary | ICD-10-CM | POA: Diagnosis not present

## 2017-02-15 DIAGNOSIS — F0390 Unspecified dementia without behavioral disturbance: Secondary | ICD-10-CM | POA: Diagnosis not present

## 2017-02-17 DIAGNOSIS — L97421 Non-pressure chronic ulcer of left heel and midfoot limited to breakdown of skin: Secondary | ICD-10-CM | POA: Diagnosis not present

## 2017-02-17 DIAGNOSIS — Z48817 Encounter for surgical aftercare following surgery on the skin and subcutaneous tissue: Secondary | ICD-10-CM | POA: Diagnosis not present

## 2017-02-17 DIAGNOSIS — E11621 Type 2 diabetes mellitus with foot ulcer: Secondary | ICD-10-CM | POA: Diagnosis not present

## 2017-02-17 DIAGNOSIS — F0390 Unspecified dementia without behavioral disturbance: Secondary | ICD-10-CM | POA: Diagnosis not present

## 2017-02-17 DIAGNOSIS — L97429 Non-pressure chronic ulcer of left heel and midfoot with unspecified severity: Secondary | ICD-10-CM | POA: Diagnosis not present

## 2017-02-17 DIAGNOSIS — E1140 Type 2 diabetes mellitus with diabetic neuropathy, unspecified: Secondary | ICD-10-CM | POA: Diagnosis not present

## 2017-02-18 ENCOUNTER — Ambulatory Visit (INDEPENDENT_AMBULATORY_CARE_PROVIDER_SITE_OTHER): Payer: Medicare Other | Admitting: Sports Medicine

## 2017-02-18 DIAGNOSIS — L97422 Non-pressure chronic ulcer of left heel and midfoot with fat layer exposed: Secondary | ICD-10-CM | POA: Diagnosis not present

## 2017-02-18 DIAGNOSIS — L909 Atrophic disorder of skin, unspecified: Secondary | ICD-10-CM

## 2017-02-18 DIAGNOSIS — E11621 Type 2 diabetes mellitus with foot ulcer: Secondary | ICD-10-CM | POA: Diagnosis not present

## 2017-02-18 DIAGNOSIS — L97529 Non-pressure chronic ulcer of other part of left foot with unspecified severity: Secondary | ICD-10-CM

## 2017-02-18 DIAGNOSIS — R29898 Other symptoms and signs involving the musculoskeletal system: Secondary | ICD-10-CM

## 2017-02-18 NOTE — Progress Notes (Signed)
Subjective: Cindy Hahn is a 82 y.o. female patient seen in office for evaluation of ulceration of her left foot afterTheraskin application # 2 was performed 2 weeks ago and painful callus. Patient is assisted by daughter-in-law this visit. Patient has a history of diabetes and dementia and a history of falls. Patient's has nursing changing dressing 2 times per week.  Denies nausea/fever/vomiting/chills/night sweats/shortness of breath. Patient has no other pedal complaints at this time.  There are no active problems to display for this patient.  Current Outpatient Prescriptions on File Prior to Visit  Medication Sig Dispense Refill  . ALPRAZolam (XANAX) 1 MG tablet Take 1 mg by mouth.    Marland Kitchen amoxicillin-clavulanate (AUGMENTIN) 875-125 MG tablet Take 1 tablet by mouth 2 (two) times daily. 28 tablet 0  . anastrozole (ARIMIDEX) 1 MG tablet take 1 tablet by mouth once daily to [place].  0  . aspirin EC 81 MG tablet Take 81 mg by mouth.    . digoxin (LANOXIN) 0.125 MG tablet   0  . diltiazem (CARDIZEM) 60 MG tablet     . LEVEMIR 100 UNIT/ML injection     . metoprolol tartrate (LOPRESSOR) 50 MG tablet take 1 and 1/2 tablet by mouth twice a day  0  . NOVOLOG FLEXPEN 100 UNIT/ML FlexPen inject 10 units SUBCUTANEOUSLY WITH MAIN MEAL  0  . potassium chloride SA (K-DUR,KLOR-CON) 20 MEQ tablet Take 40 mEq by mouth daily.  0  . rosuvastatin (CRESTOR) 20 MG tablet   0  . sertraline (ZOLOFT) 100 MG tablet     . sulfamethoxazole-trimethoprim (BACTRIM DS,SEPTRA DS) 800-160 MG tablet Take 1 tablet by mouth 2 (two) times daily. 28 tablet 0   No current facility-administered medications on file prior to visit.    Allergies  Allergen Reactions  . Levofloxacin Other (See Comments)    confusion    No results found for this or any previous visit (from the past 2160 hour(s)).  Objective: There were no vitals filed for this visit.  General: Patient is awake, alert, oriented x 3  and in no acute distress.  Dermatology: Skin is warm and dry bilateral with a full thickness ulceration present Left central heel. Ulceration measures 0.6cm x 0.8cm x 0.3 cm. There is a mildly macerated border with a granular base. The ulceration does not probe to bone. There is no active drainage, however on inner gauze layer. There is a yellow drainage,no malodor, no erythema, no edema. No other acute signs of infection.  Pre-ulcerative callus sub met 1 bilateral and sub met 5 bilateral.   Vascular: Dorsalis Pedis pulse = 1/4 Bilateral,  Posterior Tibial pulse = 1/4 Bilateral,  Capillary Fill Time < 5 seconds  Neurologic: Protective sensation absent to the level of ankles using the 5.07/10g [employer].  Musculosketal:  No Pain with palpation to ulcerated area at left heel. No pain with compression to calves bilateral. + prominent metatarsals with fat pad atrophy deformities noted bilateral.  No results for input(s): GRAMSTAIN, LABORGA in the last 8760 hours.  Assessment and Plan:  Problem List Items Addressed This Visit    None    Visit Diagnoses    Heel ulceration, left, with fat layer exposed ([place])    -  Primary   Chronic diabetic ulcer of left foot determined by examination ([place])       Fat pad atrophy of foot         -Examined patient and Re-discussed the progression of the  wound on left heel and callus and treatment alternatives. - Excisionally dedbrided ulceration at left heel to healthy bleeding borders removing nonviable tissue using a sterile chisel blade. Wound measures post debridement as above. Wound was debrided to the level of the dermis with viable wound base exposed to promote healing. Hemostasis was achieved with manuel pressure. Patient tolerated procedure well with 3cc of local anesthesia necessary for this wound debridement. Then after applied Theraskin 2x3in ID [id number], Code 102TSL, Exp 09-11-2020 secured with sutures with half of the graft wasted  covered with steri-strips, adaptic, 4x4, abd pad, kerlix and coban. This is graft #3 to site. -Continue with [facility] Nursing from [facility] for outer dressing changes of the adaptic and steristrips to the site covered with dry dressing twice weekly - Advised patient to go to the ER or return to office if the wound worsens or if constitutional symptoms are present. -Continue with postoperative shoes to wear bilateral. Advised patient to use walker or wheelchair to help her keep pressure off left heel. Advised patient again to only be on her feet for bathroom or meals and nothing else.  -Patient to return to office in 2 weeks for possible reapplication of Theraskin.   Landis Martins, DPM

## 2017-02-19 NOTE — Telephone Encounter (Signed)
-----   Message from Landis Martins, [place] sent at 02/18/2017  4:53 PM EDT ----- Regarding: Reorder Theraskin Reorder for next visit Thanks Dr. Cannon Kettle

## 2017-02-19 NOTE — Telephone Encounter (Signed)
Emailed orders for 03/04/2017 4:00pm TheraSkin 102TSL 2.0 x 3.0cm $5259.10 for 4th application to J. Suzie Portela - [facility].

## 2017-02-22 DIAGNOSIS — F0390 Unspecified dementia without behavioral disturbance: Secondary | ICD-10-CM | POA: Diagnosis not present

## 2017-02-22 DIAGNOSIS — L97429 Non-pressure chronic ulcer of left heel and midfoot with unspecified severity: Secondary | ICD-10-CM | POA: Diagnosis not present

## 2017-02-22 DIAGNOSIS — L97421 Non-pressure chronic ulcer of left heel and midfoot limited to breakdown of skin: Secondary | ICD-10-CM | POA: Diagnosis not present

## 2017-02-22 DIAGNOSIS — Z48817 Encounter for surgical aftercare following surgery on the skin and subcutaneous tissue: Secondary | ICD-10-CM | POA: Diagnosis not present

## 2017-02-22 DIAGNOSIS — E1140 Type 2 diabetes mellitus with diabetic neuropathy, unspecified: Secondary | ICD-10-CM | POA: Diagnosis not present

## 2017-02-22 DIAGNOSIS — E11621 Type 2 diabetes mellitus with foot ulcer: Secondary | ICD-10-CM | POA: Diagnosis not present

## 2017-02-25 DIAGNOSIS — L97421 Non-pressure chronic ulcer of left heel and midfoot limited to breakdown of skin: Secondary | ICD-10-CM | POA: Diagnosis not present

## 2017-02-25 DIAGNOSIS — E1140 Type 2 diabetes mellitus with diabetic neuropathy, unspecified: Secondary | ICD-10-CM | POA: Diagnosis not present

## 2017-02-25 DIAGNOSIS — F0390 Unspecified dementia without behavioral disturbance: Secondary | ICD-10-CM | POA: Diagnosis not present

## 2017-02-25 DIAGNOSIS — E11621 Type 2 diabetes mellitus with foot ulcer: Secondary | ICD-10-CM | POA: Diagnosis not present

## 2017-02-25 DIAGNOSIS — Z48817 Encounter for surgical aftercare following surgery on the skin and subcutaneous tissue: Secondary | ICD-10-CM | POA: Diagnosis not present

## 2017-02-25 DIAGNOSIS — L97429 Non-pressure chronic ulcer of left heel and midfoot with unspecified severity: Secondary | ICD-10-CM | POA: Diagnosis not present

## 2017-02-26 DIAGNOSIS — E1140 Type 2 diabetes mellitus with diabetic neuropathy, unspecified: Secondary | ICD-10-CM | POA: Diagnosis not present

## 2017-02-26 DIAGNOSIS — Z48817 Encounter for surgical aftercare following surgery on the skin and subcutaneous tissue: Secondary | ICD-10-CM | POA: Diagnosis not present

## 2017-02-26 DIAGNOSIS — L97421 Non-pressure chronic ulcer of left heel and midfoot limited to breakdown of skin: Secondary | ICD-10-CM | POA: Diagnosis not present

## 2017-02-26 DIAGNOSIS — E11621 Type 2 diabetes mellitus with foot ulcer: Secondary | ICD-10-CM | POA: Diagnosis not present

## 2017-02-26 DIAGNOSIS — L97429 Non-pressure chronic ulcer of left heel and midfoot with unspecified severity: Secondary | ICD-10-CM | POA: Diagnosis not present

## 2017-02-26 DIAGNOSIS — F0390 Unspecified dementia without behavioral disturbance: Secondary | ICD-10-CM | POA: Diagnosis not present

## 2017-03-01 DIAGNOSIS — Z48817 Encounter for surgical aftercare following surgery on the skin and subcutaneous tissue: Secondary | ICD-10-CM | POA: Diagnosis not present

## 2017-03-01 DIAGNOSIS — F0390 Unspecified dementia without behavioral disturbance: Secondary | ICD-10-CM | POA: Diagnosis not present

## 2017-03-01 DIAGNOSIS — L97421 Non-pressure chronic ulcer of left heel and midfoot limited to breakdown of skin: Secondary | ICD-10-CM | POA: Diagnosis not present

## 2017-03-01 DIAGNOSIS — L97429 Non-pressure chronic ulcer of left heel and midfoot with unspecified severity: Secondary | ICD-10-CM | POA: Diagnosis not present

## 2017-03-01 DIAGNOSIS — E11621 Type 2 diabetes mellitus with foot ulcer: Secondary | ICD-10-CM | POA: Diagnosis not present

## 2017-03-01 DIAGNOSIS — E1140 Type 2 diabetes mellitus with diabetic neuropathy, unspecified: Secondary | ICD-10-CM | POA: Diagnosis not present

## 2017-03-04 ENCOUNTER — Ambulatory Visit (INDEPENDENT_AMBULATORY_CARE_PROVIDER_SITE_OTHER): Payer: Medicare Other | Admitting: Sports Medicine

## 2017-03-04 ENCOUNTER — Encounter: Payer: Self-pay | Admitting: Sports Medicine

## 2017-03-04 DIAGNOSIS — L97529 Non-pressure chronic ulcer of other part of left foot with unspecified severity: Secondary | ICD-10-CM

## 2017-03-04 DIAGNOSIS — E11621 Type 2 diabetes mellitus with foot ulcer: Secondary | ICD-10-CM | POA: Diagnosis not present

## 2017-03-04 DIAGNOSIS — L97422 Non-pressure chronic ulcer of left heel and midfoot with fat layer exposed: Secondary | ICD-10-CM | POA: Diagnosis not present

## 2017-03-04 NOTE — Progress Notes (Signed)
Subjective: Cindy Hahn is a 82 y.o. female patient seen in office for follow up evaluation of ulceration of her left foot after Theraskin application # 3 was performed 2 weeks ago and painful callus. Patient is assisted by daughter this visit. Patient has a history of diabetes and dementia and a history of falls. Patient's has nursing changing dressing 2 times per week.  Denies nausea/fever/vomiting/chills/night sweats/shortness of breath. Patient has no other pedal complaints at this time.  There are no active problems to display for this patient.  Current Outpatient Medications on File Prior to Visit  Medication Sig Dispense Refill  . ALPRAZolam (XANAX) 1 MG tablet Take 1 mg by mouth.    Marland Kitchen amoxicillin-clavulanate (AUGMENTIN) 875-125 MG tablet Take 1 tablet by mouth 2 (two) times daily. 28 tablet 0  . anastrozole (ARIMIDEX) 1 MG tablet take 1 tablet by mouth once daily to [place].  0  . aspirin EC 81 MG tablet Take 81 mg by mouth.    . digoxin (LANOXIN) 0.125 MG tablet   0  . diltiazem (CARDIZEM) 60 MG tablet     . LEVEMIR 100 UNIT/ML injection     . metoprolol tartrate (LOPRESSOR) 50 MG tablet take 1 and 1/2 tablet by mouth twice a day  0  . NOVOLOG FLEXPEN 100 UNIT/ML FlexPen inject 10 units SUBCUTANEOUSLY WITH MAIN MEAL  0  . potassium chloride SA (K-DUR,KLOR-CON) 20 MEQ tablet Take 40 mEq by mouth daily.  0  . rosuvastatin (CRESTOR) 20 MG tablet   0  . sertraline (ZOLOFT) 100 MG tablet     . sulfamethoxazole-trimethoprim (BACTRIM DS,SEPTRA DS) 800-160 MG tablet Take 1 tablet by mouth 2 (two) times daily. 28 tablet 0   No current facility-administered medications on file prior to visit.    Allergies  Allergen Reactions  . Levofloxacin Other (See Comments)    confusion    No results found for this or any previous visit (from the past 2160 hour(s)).  Objective: There were no vitals filed for this visit.  General: Patient is awake, alert, oriented x  3 and in no acute distress.  Dermatology: Skin is warm and dry bilateral with a full thickness ulceration present Left central heel. Ulceration measures 0.5cm x 0.4cm x 0.2 cm. There is a mildly macerated border with a granular base. The ulceration does not probe to bone. There is no active drainage, however on inner gauze layer. There is a yellow drainage, scant malodor, no erythema, no edema. No other acute signs of infection.  Pre-ulcerative callus sub met 1 bilateral and sub met 5 bilateral.   Vascular: Dorsalis Pedis pulse = 1/4 Bilateral,  Posterior Tibial pulse = 1/4 Bilateral,  Capillary Fill Time < 5 seconds  Neurologic: Protective sensation absent to the level of ankles using the 5.07/10g [employer].  Musculosketal:  No Pain with palpation to ulcerated area at left heel. No pain with compression to calves bilateral. + prominent metatarsals with fat pad atrophy deformities noted bilateral.  No results for input(s): GRAMSTAIN, LABORGA in the last 8760 hours.  Assessment and Plan:  Problem List Items Addressed This Visit    None    Visit Diagnoses    Heel ulceration, left, with fat layer exposed ([place])    -  Primary   Chronic diabetic ulcer of left foot determined by examination ([place])         -Examined patient and Re-discussed the progression of the wound on left heel and callus and  treatment alternatives. - Excisionally dedbrided ulceration at left heel to healthy bleeding borders removing nonviable tissue using a sterile chisel blade. Wound measures post debridement as above. Wound was debrided to the level of the dermis with viable wound base exposed to promote healing. Hemostasis was achieved with manuel pressure. Patient tolerated procedure well with 3cc of local anesthesia necessary for this wound debridement. Then after applied Theraskin 2x3in ID [id number], Code 102TSL, Exp 09-22-2020 secured with sutures with half of the graft wasted covered with  steri-strips, adaptic, PRISMA, 4x4, abd pad, kerlix and coban. This is graft #4 to site. -Continue with Home Nursing from [facility] for outer dressing changes of the PRISMA to the site covered with dry dressing twice weekly - Advised patient to go to the ER or return to office if the wound worsens or if constitutional symptoms are present. -Continue with postoperative shoes to wear bilateral. Advised patient to use walker or wheelchair to help her keep pressure off left heel. Advised patient again to only be on her feet for bathroom or meals and nothing else.  -Patient to return to office in 2 weeks for possible reapplication of Theraskin.   Landis Martins, DPM

## 2017-03-05 ENCOUNTER — Telehealth: Payer: Self-pay | Admitting: *Deleted

## 2017-03-05 DIAGNOSIS — Z79899 Other long term (current) drug therapy: Secondary | ICD-10-CM | POA: Diagnosis not present

## 2017-03-05 DIAGNOSIS — E0865 Diabetes mellitus due to underlying condition with hyperglycemia: Secondary | ICD-10-CM | POA: Diagnosis not present

## 2017-03-05 DIAGNOSIS — I4891 Unspecified atrial fibrillation: Secondary | ICD-10-CM | POA: Diagnosis not present

## 2017-03-05 DIAGNOSIS — E785 Hyperlipidemia, unspecified: Secondary | ICD-10-CM | POA: Diagnosis not present

## 2017-03-05 DIAGNOSIS — E0822 Diabetes mellitus due to underlying condition with diabetic chronic kidney disease: Secondary | ICD-10-CM | POA: Diagnosis not present

## 2017-03-05 NOTE — Telephone Encounter (Signed)
-----   Message from [place], [place] sent at 03/04/2017  5:46 PM EST ----- Regarding: Wound care orders Updated wound care orders apply Prisma or collagen dressing over the Steri-Strips and Adaptic layer covered with 4 x 4 ABD Kerlix and Ace wrap twice weekly.

## 2017-03-05 NOTE — Telephone Encounter (Signed)
Left message for [facility] to call for new orders. Dr. Leeanne Rio orders 03/04/2017 5:46pm called to Berline Lopes - [facility].

## 2017-03-05 NOTE — Telephone Encounter (Signed)
Emailed pt's 102TSL 2 x 3" [facility]kin for [place] appt 03/18/2017 at 4:00pm from [employer].

## 2017-03-05 NOTE — Telephone Encounter (Signed)
-----   Message from Landis Martins, [place] sent at 03/04/2017  5:45 PM EST ----- Regarding: Reorder Theraskin  Reorder for next visit

## 2017-03-08 ENCOUNTER — Telehealth: Payer: Self-pay | Admitting: Sports Medicine

## 2017-03-08 DIAGNOSIS — L97429 Non-pressure chronic ulcer of left heel and midfoot with unspecified severity: Secondary | ICD-10-CM | POA: Diagnosis not present

## 2017-03-08 DIAGNOSIS — Z48817 Encounter for surgical aftercare following surgery on the skin and subcutaneous tissue: Secondary | ICD-10-CM | POA: Diagnosis not present

## 2017-03-08 DIAGNOSIS — L97421 Non-pressure chronic ulcer of left heel and midfoot limited to breakdown of skin: Secondary | ICD-10-CM | POA: Diagnosis not present

## 2017-03-08 DIAGNOSIS — E11621 Type 2 diabetes mellitus with foot ulcer: Secondary | ICD-10-CM | POA: Diagnosis not present

## 2017-03-08 DIAGNOSIS — F0390 Unspecified dementia without behavioral disturbance: Secondary | ICD-10-CM | POA: Diagnosis not present

## 2017-03-08 DIAGNOSIS — E1140 Type 2 diabetes mellitus with diabetic neuropathy, unspecified: Secondary | ICD-10-CM | POA: Diagnosis not present

## 2017-03-08 NOTE — Telephone Encounter (Signed)
I'm calling to speak to a nurse. I'm at my daughters house and forgot her phone number.

## 2017-03-08 NOTE — Telephone Encounter (Signed)
Called number back and got patients daughter told her we had received a phone call asking [place] to call back.  She stated she did not call and has no idea why her mother would everything is fine disregard the call.

## 2017-03-15 ENCOUNTER — Telehealth: Payer: Self-pay | Admitting: *Deleted

## 2017-03-15 DIAGNOSIS — F0390 Unspecified dementia without behavioral disturbance: Secondary | ICD-10-CM | POA: Diagnosis not present

## 2017-03-15 DIAGNOSIS — Z48817 Encounter for surgical aftercare following surgery on the skin and subcutaneous tissue: Secondary | ICD-10-CM | POA: Diagnosis not present

## 2017-03-15 DIAGNOSIS — L97429 Non-pressure chronic ulcer of left heel and midfoot with unspecified severity: Secondary | ICD-10-CM | POA: Diagnosis not present

## 2017-03-15 DIAGNOSIS — E1140 Type 2 diabetes mellitus with diabetic neuropathy, unspecified: Secondary | ICD-10-CM | POA: Diagnosis not present

## 2017-03-15 DIAGNOSIS — E11621 Type 2 diabetes mellitus with foot ulcer: Secondary | ICD-10-CM | POA: Diagnosis not present

## 2017-03-15 DIAGNOSIS — L97421 Non-pressure chronic ulcer of left heel and midfoot limited to breakdown of skin: Secondary | ICD-10-CM | POA: Diagnosis not present

## 2017-03-15 NOTE — Telephone Encounter (Signed)
[place] states pt has developed blisters on the graft foot toes and they have burst. I told her pt needed an appt and would be able to see Dr. [name] in [place] tomorrow. Estill Bamberg states she will dress areas with product they have used on the foot Prisma and get pt an appt. I transferred Estill Bamberg to schedulers to assist in getting pt an appt tomorrow.

## 2017-03-16 ENCOUNTER — Telehealth: Payer: Self-pay | Admitting: Sports Medicine

## 2017-03-16 ENCOUNTER — Other Ambulatory Visit: Payer: Self-pay | Admitting: Sports Medicine

## 2017-03-16 DIAGNOSIS — L97529 Non-pressure chronic ulcer of other part of left foot with unspecified severity: Principal | ICD-10-CM

## 2017-03-16 DIAGNOSIS — E11621 Type 2 diabetes mellitus with foot ulcer: Secondary | ICD-10-CM

## 2017-03-16 MED ORDER — SULFAMETHOXAZOLE-TRIMETHOPRIM 800-160 MG PO TABS: 1.0000 | ORAL_TABLET | Freq: Two times a day (BID) | ORAL | 0 refills | Status: DC

## 2017-03-16 NOTE — Progress Notes (Signed)
Sent antibiotic to her pharmacy -Dr. Chauncey Cruel

## 2017-03-16 NOTE — Telephone Encounter (Signed)
Returned daughters phone call letting her know Dr Cannon Kettle has already sent that Rx in for her and we will see her on Thursday.

## 2017-03-16 NOTE — Telephone Encounter (Signed)
Cindy Hahn, daughter, called stating that the patient has cellulitis and questions whether she needs an antibiotic.

## 2017-03-16 NOTE — Telephone Encounter (Signed)
Send Bactrim 160mg /800mg , take I tab twice daily, 28 tabs to pharmacy

## 2017-03-16 NOTE — Telephone Encounter (Signed)
I was calling to see if Dr. Cannon Kettle could go ahead and start Dura on an antibiotic for her cellulitis before it gets worse. I was unsure if she is working in that office today as I know she is not in the [place] office today. I was unable to get her an appointment though she does have one on Thursday to see Dr. Cannon Kettle. I know that the cellulitis can worse quickly and cause her to end up in the hospital. If you could please call me back at [phone]. Thank you.

## 2017-03-18 ENCOUNTER — Encounter: Payer: Self-pay | Admitting: Sports Medicine

## 2017-03-18 ENCOUNTER — Ambulatory Visit (INDEPENDENT_AMBULATORY_CARE_PROVIDER_SITE_OTHER): Payer: Medicare Other | Admitting: Sports Medicine

## 2017-03-18 DIAGNOSIS — E1165 Type 2 diabetes mellitus with hyperglycemia: Secondary | ICD-10-CM

## 2017-03-18 DIAGNOSIS — L97521 Non-pressure chronic ulcer of other part of left foot limited to breakdown of skin: Secondary | ICD-10-CM | POA: Diagnosis not present

## 2017-03-18 DIAGNOSIS — L909 Atrophic disorder of skin, unspecified: Secondary | ICD-10-CM

## 2017-03-18 DIAGNOSIS — L03032 Cellulitis of left toe: Secondary | ICD-10-CM

## 2017-03-18 DIAGNOSIS — E1140 Type 2 diabetes mellitus with diabetic neuropathy, unspecified: Secondary | ICD-10-CM

## 2017-03-18 DIAGNOSIS — L02612 Cutaneous abscess of left foot: Secondary | ICD-10-CM

## 2017-03-18 DIAGNOSIS — L84 Corns and callosities: Secondary | ICD-10-CM

## 2017-03-18 DIAGNOSIS — M216X9 Other acquired deformities of unspecified foot: Secondary | ICD-10-CM

## 2017-03-18 DIAGNOSIS — R29898 Other symptoms and signs involving the musculoskeletal system: Secondary | ICD-10-CM

## 2017-03-18 DIAGNOSIS — L97422 Non-pressure chronic ulcer of left heel and midfoot with fat layer exposed: Secondary | ICD-10-CM

## 2017-03-18 DIAGNOSIS — IMO0002 Reserved for concepts with insufficient information to code with codable children: Secondary | ICD-10-CM

## 2017-03-18 DIAGNOSIS — L97529 Non-pressure chronic ulcer of other part of left foot with unspecified severity: Secondary | ICD-10-CM

## 2017-03-18 DIAGNOSIS — E11621 Type 2 diabetes mellitus with foot ulcer: Secondary | ICD-10-CM

## 2017-03-18 NOTE — Progress Notes (Signed)
Subjective: Cindy Hahn is a 82 y.o. female patient seen in office for follow up evaluation of ulceration of her left foot after Theraskin application # 4 was performed 2 weeks ago and painful callus. Patient reports there is a new wound with cellulitis at the left first and second toes.  This wound was noticed with the cellulitis on Tuesday and patient was started on Bactrim which she has taken about 4 doses with no significant improvement.  Patient is assisted by daughter this visit. Patient has a history of diabetes and dementia and a history of falls. Patient's has nursing changing dressing 2 times per week who noticed the cellulitis on Tuesday.  Denies nausea/fever/vomiting/chills/night sweats/shortness of breath.  However admits to poor appetite. Patient has no other pedal complaints at this time.  There are no active problems to display for this patient.  Current Outpatient Medications on File Prior to Visit  Medication Sig Dispense Refill  . ALPRAZolam (XANAX) 1 MG tablet Take 1 mg by mouth.    Marland Kitchen amoxicillin-clavulanate (AUGMENTIN) 875-125 MG tablet Take 1 tablet by mouth 2 (two) times daily. 28 tablet 0  . anastrozole (ARIMIDEX) 1 MG tablet take 1 tablet by mouth once daily to [place].  0  . aspirin EC 81 MG tablet Take 81 mg by mouth.    . digoxin (LANOXIN) 0.125 MG tablet   0  . diltiazem (CARDIZEM) 60 MG tablet     . LEVEMIR 100 UNIT/ML injection     . metoprolol tartrate (LOPRESSOR) 50 MG tablet take 1 and 1/2 tablet by mouth twice a day  0  . NOVOLOG FLEXPEN 100 UNIT/ML FlexPen inject 10 units SUBCUTANEOUSLY WITH MAIN MEAL  0  . potassium chloride SA (K-DUR,KLOR-CON) 20 MEQ tablet Take 40 mEq by mouth daily.  0  . rosuvastatin (CRESTOR) 20 MG tablet   0  . sertraline (ZOLOFT) 100 MG tablet     . sulfamethoxazole-trimethoprim (BACTRIM DS,SEPTRA DS) 800-160 MG tablet Take 1 tablet by mouth 2 (two) times daily. 28 tablet 0   No current  facility-administered medications on file prior to visit.    Allergies  Allergen Reactions  . Levofloxacin Other (See Comments)    confusion    No results found for this or any previous visit (from the past 2160 hour(s)).  Objective: There were no vitals filed for this visit.  General: Patient is awake, alert, oriented x 3 and in no acute distress.  Dermatology: Skin is warm and dry bilateral with a full thickness ulceration present Left central heel. Ulceration measures 0.5cm x 0.4cm x 0.1 cm. There is a mildly macerated border with a granular base. The ulceration does not probe to bone. There is no active drainage, however on inner gauze layer. There is a yellow drainage, no malodor, no erythema, no edema.  To the lateral first toe and second toes there is significant ruptured blister skin with ulceration with fibrotic base measuring 1 cm x 0.5 cm with significant faint dusky erythema extending from the interspace of the anterior part of the foot to the lower shin without warmth mild swelling no active drainage however on because inner layers there is a yellow drainage present without odor. No other acute signs of infection.  Pre-ulcerative callus sub met 1 bilateral and sub met 5 bilateral.   Vascular: Dorsalis Pedis pulse = 1/4 Bilateral,  Posterior Tibial pulse = 1/4 Bilateral,  Capillary Fill Time < 5 seconds  Neurologic: Protective sensation absent to the level  of ankles using the 5.07/10g [employer].  Musculosketal:  No Pain with palpation to ulcerated area at left heel or left toes. No pain with compression to calves bilateral. + prominent metatarsals with fat pad atrophy deformities noted bilateral.  No results for input(s): GRAMSTAIN, LABORGA in the last 8760 hours.  Assessment and Plan:  Problem List Items Addressed This Visit    None    Visit Diagnoses    Cellulitis and abscess of toe of left foot    -  Primary   Toe ulcer, left, limited to breakdown  of skin (HCC)       New   Heel ulceration, left, with fat layer exposed ([place])       Improving   Chronic diabetic ulcer of left foot determined by examination ([place])       Fat pad atrophy of foot       Prominent metatarsal head, unspecified laterality       Pre-ulcerative calluses       Uncontrolled type 2 diabetes with neuropathy ([place])         -Examined patient and Re-discussed the progression of the wound on left heel and callus and now new ulcerations with cellulitis at left first and second toes.  - Excisionally dedbrided ulceration at left heel and toes to healthy bleeding borders removing nonviable tissue using a sterile chisel blade. Wound measures post debridement as above. Wound was debrided to the level of the dermis with viable wound base exposed to promote healing. Hemostasis was achieved with manuel pressure.  There is skin graft was not applied at the left heel due to acute cellulitis on toes.  Thus applied PRISMA to all areas covered with dry sterile dressing.  Marked the area of cellulitis and contacted [facility] care nursing to assist the patient on tomorrow to see if the area of cellulitis is worsening if it is worsening advised the patient and nursing agency to call the office for further instructions likely patient will need IV antibiotics if area is not improved on p.o. Bactrim. - Advised patient to go to the ER or return to office if the wound worsens or if constitutional symptoms are present. -Continue with postoperative shoes. Advised patient to use walker or wheelchair to help her keep pressure off left heel. Advised patient again to only be on her feet for bathroom or meals and nothing else as previously encouraged.  -Patient to return to office in 1 week for reassessment of cellulitis and determine if we can resume TheraSkin.  Landis Martins, DPM

## 2017-03-19 ENCOUNTER — Telehealth: Payer: Self-pay | Admitting: *Deleted

## 2017-03-19 DIAGNOSIS — E1140 Type 2 diabetes mellitus with diabetic neuropathy, unspecified: Secondary | ICD-10-CM | POA: Diagnosis not present

## 2017-03-19 DIAGNOSIS — F0390 Unspecified dementia without behavioral disturbance: Secondary | ICD-10-CM | POA: Diagnosis not present

## 2017-03-19 DIAGNOSIS — L97421 Non-pressure chronic ulcer of left heel and midfoot limited to breakdown of skin: Secondary | ICD-10-CM | POA: Diagnosis not present

## 2017-03-19 DIAGNOSIS — Z48817 Encounter for surgical aftercare following surgery on the skin and subcutaneous tissue: Secondary | ICD-10-CM | POA: Diagnosis not present

## 2017-03-19 DIAGNOSIS — E11621 Type 2 diabetes mellitus with foot ulcer: Secondary | ICD-10-CM | POA: Diagnosis not present

## 2017-03-19 DIAGNOSIS — L97429 Non-pressure chronic ulcer of left heel and midfoot with unspecified severity: Secondary | ICD-10-CM | POA: Diagnosis not present

## 2017-03-19 NOTE — Telephone Encounter (Signed)
As per my last note, PRISMA to all areas

## 2017-03-19 NOTE — Telephone Encounter (Signed)
Cindy Hahn - [facility] states pt's cellulitis and improved, and she sees the graft has been removed, are there any new [place] orders.

## 2017-03-22 ENCOUNTER — Telehealth: Payer: Self-pay

## 2017-03-22 NOTE — Telephone Encounter (Signed)
Gave VO to [place] from [place] to cover all areas with Prisma and dry dressing, change twice a week

## 2017-03-23 DIAGNOSIS — Z932 Ileostomy status: Secondary | ICD-10-CM | POA: Diagnosis not present

## 2017-03-23 DIAGNOSIS — Z48817 Encounter for surgical aftercare following surgery on the skin and subcutaneous tissue: Secondary | ICD-10-CM | POA: Diagnosis not present

## 2017-03-23 DIAGNOSIS — L97421 Non-pressure chronic ulcer of left heel and midfoot limited to breakdown of skin: Secondary | ICD-10-CM | POA: Diagnosis not present

## 2017-03-23 DIAGNOSIS — Z794 Long term (current) use of insulin: Secondary | ICD-10-CM | POA: Diagnosis not present

## 2017-03-23 DIAGNOSIS — Z9181 History of falling: Secondary | ICD-10-CM | POA: Diagnosis not present

## 2017-03-23 DIAGNOSIS — F0390 Unspecified dementia without behavioral disturbance: Secondary | ICD-10-CM | POA: Diagnosis not present

## 2017-03-23 DIAGNOSIS — E11621 Type 2 diabetes mellitus with foot ulcer: Secondary | ICD-10-CM | POA: Diagnosis not present

## 2017-03-23 DIAGNOSIS — E1140 Type 2 diabetes mellitus with diabetic neuropathy, unspecified: Secondary | ICD-10-CM | POA: Diagnosis not present

## 2017-03-23 DIAGNOSIS — L97429 Non-pressure chronic ulcer of left heel and midfoot with unspecified severity: Secondary | ICD-10-CM | POA: Diagnosis not present

## 2017-03-25 ENCOUNTER — Encounter: Payer: Self-pay | Admitting: Sports Medicine

## 2017-03-25 ENCOUNTER — Ambulatory Visit (INDEPENDENT_AMBULATORY_CARE_PROVIDER_SITE_OTHER): Payer: Medicare Other | Admitting: Sports Medicine

## 2017-03-25 DIAGNOSIS — E1165 Type 2 diabetes mellitus with hyperglycemia: Secondary | ICD-10-CM

## 2017-03-25 DIAGNOSIS — L03032 Cellulitis of left toe: Secondary | ICD-10-CM | POA: Diagnosis not present

## 2017-03-25 DIAGNOSIS — L97521 Non-pressure chronic ulcer of other part of left foot limited to breakdown of skin: Secondary | ICD-10-CM | POA: Diagnosis not present

## 2017-03-25 DIAGNOSIS — E11621 Type 2 diabetes mellitus with foot ulcer: Secondary | ICD-10-CM

## 2017-03-25 DIAGNOSIS — L84 Corns and callosities: Secondary | ICD-10-CM

## 2017-03-25 DIAGNOSIS — IMO0002 Reserved for concepts with insufficient information to code with codable children: Secondary | ICD-10-CM

## 2017-03-25 DIAGNOSIS — R29898 Other symptoms and signs involving the musculoskeletal system: Secondary | ICD-10-CM

## 2017-03-25 DIAGNOSIS — L97422 Non-pressure chronic ulcer of left heel and midfoot with fat layer exposed: Secondary | ICD-10-CM | POA: Diagnosis not present

## 2017-03-25 DIAGNOSIS — L909 Atrophic disorder of skin, unspecified: Secondary | ICD-10-CM

## 2017-03-25 DIAGNOSIS — L97529 Non-pressure chronic ulcer of other part of left foot with unspecified severity: Secondary | ICD-10-CM

## 2017-03-25 DIAGNOSIS — L02612 Cutaneous abscess of left foot: Secondary | ICD-10-CM

## 2017-03-25 DIAGNOSIS — E1140 Type 2 diabetes mellitus with diabetic neuropathy, unspecified: Secondary | ICD-10-CM

## 2017-03-25 DIAGNOSIS — M216X9 Other acquired deformities of unspecified foot: Secondary | ICD-10-CM

## 2017-03-25 NOTE — Progress Notes (Signed)
Subjective: Cindy Hahn is a 82 y.o. female patient seen in office for follow up evaluation of ulcerations of her left foot and after Theraskin application # 4 which was performed 3 weeks ago at the heel and painful callus. Patient reports that she is doing well on antibiotic with no problem.  Denies nausea/fever/vomiting/chills/night sweats/shortness of breath.  However admits to poor balance. Patient has no other pedal complaints at this time.  There are no active problems to display for this patient.  Current Outpatient Medications on File Prior to Visit  Medication Sig Dispense Refill  . ALPRAZolam (XANAX) 1 MG tablet Take 1 mg by mouth.    Marland Kitchen amoxicillin-clavulanate (AUGMENTIN) 875-125 MG tablet Take 1 tablet by mouth 2 (two) times daily. 28 tablet 0  . anastrozole (ARIMIDEX) 1 MG tablet take 1 tablet by mouth once daily to [place].  0  . aspirin EC 81 MG tablet Take 81 mg by mouth.    . digoxin (LANOXIN) 0.125 MG tablet   0  . diltiazem (CARDIZEM) 60 MG tablet     . LEVEMIR 100 UNIT/ML injection     . metoprolol tartrate (LOPRESSOR) 50 MG tablet take 1 and 1/2 tablet by mouth twice a day  0  . NOVOLOG FLEXPEN 100 UNIT/ML FlexPen inject 10 units SUBCUTANEOUSLY WITH MAIN MEAL  0  . potassium chloride SA (K-DUR,KLOR-CON) 20 MEQ tablet Take 40 mEq by mouth daily.  0  . rosuvastatin (CRESTOR) 20 MG tablet   0  . sertraline (ZOLOFT) 100 MG tablet     . sulfamethoxazole-trimethoprim (BACTRIM DS,SEPTRA DS) 800-160 MG tablet Take 1 tablet by mouth 2 (two) times daily. 28 tablet 0   No current facility-administered medications on file prior to visit.    Allergies  Allergen Reactions  . Levofloxacin Other (See Comments)    confusion    No results found for this or any previous visit (from the past 2160 hour(s)).  Objective: There were no vitals filed for this visit.  General: Patient is awake, alert, oriented x 3 and in no acute distress.  Dermatology:  Skin is warm and dry bilateral with a full thickness ulceration present Left central heel. Ulceration measures 0.5cm x 0.3cm x 0.1 cm. There is a minimally macerated border with a granular base. The ulceration does not probe to bone. There is no active drainage, however on inner gauze layer. There is a yellow drainage, no malodor, no erythema, no edema.  To the lateral first toe and second toes there is significant ruptured blister skin with ulceration with fibrogranular base measuring 1 cm x 0.5 cm with decreased dusky erythema extending from the interspace of the anterior part of the foot to the lower shin without warmth mild swelling. No other acute signs of infection.  Pre-ulcerative callus sub met 1 bilateral and sub met 5 bilateral.   Vascular: Dorsalis Pedis pulse = 1/4 Bilateral,  Posterior Tibial pulse = 1/4 Bilateral,  Capillary Fill Time < 5 seconds  Neurologic: Protective sensation absent to the level of ankles using the 5.07/10g [employer].  Musculosketal:  No Pain with palpation to ulcerated area at left heel or left toes. No pain with compression to calves bilateral. + prominent metatarsals with fat pad atrophy deformities noted bilateral.  No results for input(s): GRAMSTAIN, LABORGA in the last 8760 hours.  Assessment and Plan:  Problem List Items Addressed This Visit    None    Visit Diagnoses    Cellulitis and abscess  of toe of left foot    -  Primary   Toe ulcer, left, limited to breakdown of skin (HCC)       Heel ulceration, left, with fat layer exposed ([place])       improving   Chronic diabetic ulcer of left foot determined by examination ([place])       Fat pad atrophy of foot       Prominent metatarsal head, unspecified laterality       Pre-ulcerative calluses       Uncontrolled type 2 diabetes with neuropathy ([place])         -Examined patient and Re-discussed the progression of the wound on left heel and callus and now new ulcerations with cellulitis at  left first and second toes.  - Excisionally dedbrided ulceration at left heel and toes to healthy bleeding borders removing nonviable tissue using a sterile chisel blade. Wound measures post debridement as above. Wound was debrided to the level of the dermis with viable wound base exposed to promote healing. Hemostasis was achieved with manuel pressure. There is skin graft was not applied again at the left heel due to improving acute cellulitis on toes.  Thus applied PRISMA to all areas covered with dry sterile dressing again.  Nursing to continue with Prisma to all these areas twice weekly. -Continue with p.o. Bactrim until completed. - Advised patient to go to the ER or return to office if the wound worsens or if constitutional symptoms are present. -Continue with postoperative shoes. Advised patient to use walker or wheelchair to help her keep pressure off left heel and to help steady herself to prevent risk of falling.  -Patient to return to office in 2 weeks to determine if we can resume TheraSkin at left heel.  Request for theraskin was placed again to have available for next visit.  Landis Martins, DPM

## 2017-03-26 ENCOUNTER — Telehealth: Payer: Self-pay

## 2017-03-26 NOTE — Telephone Encounter (Signed)
Spoke with Theraskin rep to reorder for appointment date on 04/07/17

## 2017-04-01 DIAGNOSIS — L97421 Non-pressure chronic ulcer of left heel and midfoot limited to breakdown of skin: Secondary | ICD-10-CM | POA: Diagnosis not present

## 2017-04-01 DIAGNOSIS — E11621 Type 2 diabetes mellitus with foot ulcer: Secondary | ICD-10-CM | POA: Diagnosis not present

## 2017-04-01 DIAGNOSIS — E1140 Type 2 diabetes mellitus with diabetic neuropathy, unspecified: Secondary | ICD-10-CM | POA: Diagnosis not present

## 2017-04-01 DIAGNOSIS — L97429 Non-pressure chronic ulcer of left heel and midfoot with unspecified severity: Secondary | ICD-10-CM | POA: Diagnosis not present

## 2017-04-01 DIAGNOSIS — F0390 Unspecified dementia without behavioral disturbance: Secondary | ICD-10-CM | POA: Diagnosis not present

## 2017-04-01 DIAGNOSIS — Z48817 Encounter for surgical aftercare following surgery on the skin and subcutaneous tissue: Secondary | ICD-10-CM | POA: Diagnosis not present

## 2017-04-06 DIAGNOSIS — E11621 Type 2 diabetes mellitus with foot ulcer: Secondary | ICD-10-CM | POA: Diagnosis not present

## 2017-04-06 DIAGNOSIS — L97421 Non-pressure chronic ulcer of left heel and midfoot limited to breakdown of skin: Secondary | ICD-10-CM | POA: Diagnosis not present

## 2017-04-06 DIAGNOSIS — Z48817 Encounter for surgical aftercare following surgery on the skin and subcutaneous tissue: Secondary | ICD-10-CM | POA: Diagnosis not present

## 2017-04-06 DIAGNOSIS — L97429 Non-pressure chronic ulcer of left heel and midfoot with unspecified severity: Secondary | ICD-10-CM | POA: Diagnosis not present

## 2017-04-06 DIAGNOSIS — E1140 Type 2 diabetes mellitus with diabetic neuropathy, unspecified: Secondary | ICD-10-CM | POA: Diagnosis not present

## 2017-04-06 DIAGNOSIS — F0390 Unspecified dementia without behavioral disturbance: Secondary | ICD-10-CM | POA: Diagnosis not present

## 2017-04-07 ENCOUNTER — Ambulatory Visit (INDEPENDENT_AMBULATORY_CARE_PROVIDER_SITE_OTHER): Payer: Medicare Other | Admitting: Sports Medicine

## 2017-04-07 ENCOUNTER — Encounter: Payer: Self-pay | Admitting: Sports Medicine

## 2017-04-07 DIAGNOSIS — L03032 Cellulitis of left toe: Secondary | ICD-10-CM

## 2017-04-07 DIAGNOSIS — E1165 Type 2 diabetes mellitus with hyperglycemia: Secondary | ICD-10-CM

## 2017-04-07 DIAGNOSIS — L97422 Non-pressure chronic ulcer of left heel and midfoot with fat layer exposed: Secondary | ICD-10-CM | POA: Diagnosis not present

## 2017-04-07 DIAGNOSIS — IMO0002 Reserved for concepts with insufficient information to code with codable children: Secondary | ICD-10-CM

## 2017-04-07 DIAGNOSIS — L97529 Non-pressure chronic ulcer of other part of left foot with unspecified severity: Secondary | ICD-10-CM

## 2017-04-07 DIAGNOSIS — L84 Corns and callosities: Secondary | ICD-10-CM

## 2017-04-07 DIAGNOSIS — R29898 Other symptoms and signs involving the musculoskeletal system: Secondary | ICD-10-CM

## 2017-04-07 DIAGNOSIS — M216X9 Other acquired deformities of unspecified foot: Secondary | ICD-10-CM

## 2017-04-07 DIAGNOSIS — L97521 Non-pressure chronic ulcer of other part of left foot limited to breakdown of skin: Secondary | ICD-10-CM

## 2017-04-07 DIAGNOSIS — E1140 Type 2 diabetes mellitus with diabetic neuropathy, unspecified: Secondary | ICD-10-CM

## 2017-04-07 DIAGNOSIS — L909 Atrophic disorder of skin, unspecified: Secondary | ICD-10-CM

## 2017-04-07 DIAGNOSIS — L02612 Cutaneous abscess of left foot: Secondary | ICD-10-CM

## 2017-04-07 DIAGNOSIS — E11621 Type 2 diabetes mellitus with foot ulcer: Secondary | ICD-10-CM

## 2017-04-07 MED ORDER — SULFAMETHOXAZOLE-TRIMETHOPRIM 800-160 MG PO TABS: 1.0000 | ORAL_TABLET | Freq: Two times a day (BID) | ORAL | 0 refills | Status: DC

## 2017-04-07 MED ORDER — FLUCONAZOLE 150 MG PO TABS: 150.0000 mg | ORAL_TABLET | Freq: Once | ORAL | 0 refills | Status: AC

## 2017-04-07 NOTE — Progress Notes (Signed)
Subjective: Cindy Hahn is a 82 y.o. female patient seen in office for follow up evaluation of ulcerations of her left foot and after Theraskin application # 4 which was performed 5 weeks ago at the heel and painful callus. Patient reports that she finished antibiotic but has been having increased soreness and pain to her left heel.  Denies nausea/fever/vomiting/chills/night sweats/shortness of breath. Patient has no other pedal complaints at this time.  There are no active problems to display for this patient.  Current Outpatient Medications on File Prior to Visit  Medication Sig Dispense Refill  . ALPRAZolam (XANAX) 1 MG tablet Take 1 mg by mouth.    Marland Kitchen amoxicillin-clavulanate (AUGMENTIN) 875-125 MG tablet Take 1 tablet by mouth 2 (two) times daily. 28 tablet 0  . anastrozole (ARIMIDEX) 1 MG tablet take 1 tablet by mouth once daily to [place].  0  . aspirin EC 81 MG tablet Take 81 mg by mouth.    . digoxin (LANOXIN) 0.125 MG tablet   0  . diltiazem (CARDIZEM) 60 MG tablet     . LEVEMIR 100 UNIT/ML injection     . metoprolol tartrate (LOPRESSOR) 50 MG tablet take 1 and 1/2 tablet by mouth twice a day  0  . NOVOLOG FLEXPEN 100 UNIT/ML FlexPen inject 10 units SUBCUTANEOUSLY WITH MAIN MEAL  0  . potassium chloride SA (K-DUR,KLOR-CON) 20 MEQ tablet Take 40 mEq by mouth daily.  0  . rosuvastatin (CRESTOR) 20 MG tablet   0  . sertraline (ZOLOFT) 100 MG tablet     . sulfamethoxazole-trimethoprim (BACTRIM DS,SEPTRA DS) 800-160 MG tablet Take 1 tablet by mouth 2 (two) times daily. 28 tablet 0   No current facility-administered medications on file prior to visit.    Allergies  Allergen Reactions  . Levofloxacin Other (See Comments)    confusion    No results found for this or any previous visit (from the past 2160 hour(s)).  Objective: There were no vitals filed for this visit.  General: Patient is awake, alert, oriented x 3 and in no acute  distress.  Dermatology: Skin is warm and dry bilateral with a full thickness ulceration present Left central heel. Ulceration measures 2 cm x 2.5 cm x 0.5 cm. There is a keratoticborder with a granular base. The ulceration does not probe to bone however there is significant undermining and a lot of seropurulent drainage, no malodor, no erythema, no edema.  To the lateral first toe and second toes there is dried ruptured blister skin with ulceration with fibrogranular base measuring 0.5 cm x 0.5 cm with resolved dusky erythema extending from the interspace of the anterior part of the foot to the lower shin without warmth mild swelling. No other acute signs of infection.  Pre-ulcerative callus sub met 1 bilateral and sub met 5 bilateral.   Vascular: Dorsalis Pedis pulse = 1/4 Bilateral,  Posterior Tibial pulse = 1/4 Bilateral,  Capillary Fill Time < 5 seconds  Neurologic: Protective sensation absent to the level of ankles using the 5.07/10g [employer].  Musculosketal: Mild pain with palpation to ulcerated area at left heel but no pain to toes on left foot. No pain with compression to calves bilateral. + prominent metatarsals with fat pad atrophy deformities noted bilateral.  No results for input(s): GRAMSTAIN, LABORGA in the last 8760 hours.  Assessment and Plan:  Problem List Items Addressed This Visit    None    Visit Diagnoses    Heel ulceration, left,  with fat layer exposed ([place])    -  Primary   Relevant Orders   WOUND CULTURE   Cellulitis and abscess of toe of left foot       Chronic diabetic ulcer of left foot determined by examination ([place])       Fat pad atrophy of foot       Prominent metatarsal head, unspecified laterality       Toe ulcer, left, limited to breakdown of skin ([place])       Pre-ulcerative calluses       Uncontrolled type 2 diabetes with neuropathy ([place])         -Examined patient and Re-discussed the progression of the wound on left heel and left  first and second toes.  - Excisionally dedbrided ulceration at left heel and toes to healthy bleeding borders removing nonviable tissue using a sterile chisel blade. Wound measures post debridement as above. Wound was debrided to the level of the dermis with viable wound base exposed to promote healing. Hemostasis was achieved with manuel pressure. There is skin graft was not applied again at the left heel due to seropurulent drainage with likely infection thu a wound culture was obtained and sent to [facility]; will call patient if a change in antibiotic therapy is needed.  Applied PRISMA to all areas covered with dry sterile dressing again.  Nursing to continue with Prisma to all these areas 3x weekly. -Refilled p.o. Bactrim and Rx Diflucan - Advised patient to go to the ER or return to office if the wound worsens or if constitutional symptoms are present. -Continue with postoperative shoe. Advised patient to use walker or wheelchair to help her keep pressure off left heel and to help steady herself to prevent risk of falling.  -Patient to return to office in 2 weeks to determine if we can resume TheraSkin at left heel.    Landis Martins, DPM

## 2017-04-08 ENCOUNTER — Telehealth: Payer: Self-pay | Admitting: *Deleted

## 2017-04-08 NOTE — Telephone Encounter (Signed)
Cindy Hahn was at appointment took unused graft with him.

## 2017-04-08 NOTE — Telephone Encounter (Signed)
-----   Message from Landis Martins, [place] sent at 04/07/2017  7:02 PM EST ----- Regarding: Theraskin Sent Back and Wound care orders  Joe was at appointment. We DID NOT graft patient. Returned graft. I will let you know when we are ready to re-apply  Wound care nurse to continue with PRISMA dressing to heel and toes on left foot 3x per week Thanks Dr. Cannon Kettle

## 2017-04-08 NOTE — Telephone Encounter (Signed)
Emailed request for TheraSkin to be picked up by [facility] - TheraSkin.

## 2017-04-15 ENCOUNTER — Telehealth: Payer: Self-pay | Admitting: *Deleted

## 2017-04-15 DIAGNOSIS — L97421 Non-pressure chronic ulcer of left heel and midfoot limited to breakdown of skin: Secondary | ICD-10-CM | POA: Diagnosis not present

## 2017-04-15 DIAGNOSIS — E11621 Type 2 diabetes mellitus with foot ulcer: Secondary | ICD-10-CM | POA: Diagnosis not present

## 2017-04-15 DIAGNOSIS — L97429 Non-pressure chronic ulcer of left heel and midfoot with unspecified severity: Secondary | ICD-10-CM | POA: Diagnosis not present

## 2017-04-15 DIAGNOSIS — E1140 Type 2 diabetes mellitus with diabetic neuropathy, unspecified: Secondary | ICD-10-CM | POA: Diagnosis not present

## 2017-04-15 DIAGNOSIS — Z48817 Encounter for surgical aftercare following surgery on the skin and subcutaneous tissue: Secondary | ICD-10-CM | POA: Diagnosis not present

## 2017-04-15 DIAGNOSIS — F0390 Unspecified dementia without behavioral disturbance: Secondary | ICD-10-CM | POA: Diagnosis not present

## 2017-04-15 NOTE — Telephone Encounter (Signed)
Cindy Hahn - [facility] states pt fell through the [facility] tree.

## 2017-04-16 ENCOUNTER — Other Ambulatory Visit: Payer: Self-pay | Admitting: Sports Medicine

## 2017-04-16 MED ORDER — CLINDAMYCIN HCL 300 MG PO CAPS: 300.0000 mg | ORAL_CAPSULE | Freq: Three times a day (TID) | ORAL | 0 refills | Status: DC

## 2017-04-16 NOTE — Progress Notes (Signed)
Wound culture was positive. Sent Clindamycin to patient pharmacy. -Dr. Cannon Kettle

## 2017-04-16 NOTE — Telephone Encounter (Signed)
Thanks Dr. Demani Weyrauch!!! ?

## 2017-04-23 ENCOUNTER — Ambulatory Visit (INDEPENDENT_AMBULATORY_CARE_PROVIDER_SITE_OTHER): Payer: Medicare Other | Admitting: Sports Medicine

## 2017-04-23 ENCOUNTER — Encounter: Payer: Self-pay | Admitting: Sports Medicine

## 2017-04-23 DIAGNOSIS — L84 Corns and callosities: Secondary | ICD-10-CM

## 2017-04-23 DIAGNOSIS — L97422 Non-pressure chronic ulcer of left heel and midfoot with fat layer exposed: Secondary | ICD-10-CM | POA: Diagnosis not present

## 2017-04-23 DIAGNOSIS — L909 Atrophic disorder of skin, unspecified: Secondary | ICD-10-CM

## 2017-04-23 DIAGNOSIS — R29898 Other symptoms and signs involving the musculoskeletal system: Secondary | ICD-10-CM

## 2017-04-23 DIAGNOSIS — L97529 Non-pressure chronic ulcer of other part of left foot with unspecified severity: Secondary | ICD-10-CM

## 2017-04-23 DIAGNOSIS — L03032 Cellulitis of left toe: Secondary | ICD-10-CM

## 2017-04-23 DIAGNOSIS — E11621 Type 2 diabetes mellitus with foot ulcer: Secondary | ICD-10-CM

## 2017-04-23 DIAGNOSIS — L02612 Cutaneous abscess of left foot: Secondary | ICD-10-CM

## 2017-04-23 NOTE — Progress Notes (Signed)
Subjective: Cindy Hahn is a 83 y.o. female patient seen in office for follow up evaluation of ulcerations of her left foot and after Theraskin application # 4 which was performed 6 weeks ago at the heel and painful callus. Patient reports that she finished antibiotic but still has some soreness and pain to her left heel.  Denies nausea/fever/vomiting/chills/night sweats/shortness of breath. Patient has no other pedal complaints at this time.  There are no active problems to display for this patient.  Current Outpatient Medications on File Prior to Visit  Medication Sig Dispense Refill  . ALPRAZolam (XANAX) 1 MG tablet Take 1 mg by mouth.    Marland Kitchen amoxicillin-clavulanate (AUGMENTIN) 875-125 MG tablet Take 1 tablet by mouth 2 (two) times daily. 28 tablet 0  . anastrozole (ARIMIDEX) 1 MG tablet take 1 tablet by mouth once daily to [place].  0  . aspirin EC 81 MG tablet Take 81 mg by mouth.    . clindamycin (CLEOCIN) 300 MG capsule Take 1 capsule (300 mg total) by mouth 3 (three) times daily. 30 capsule 0  . digoxin (LANOXIN) 0.125 MG tablet   0  . diltiazem (CARDIZEM) 60 MG tablet     . LEVEMIR 100 UNIT/ML injection     . metoprolol tartrate (LOPRESSOR) 50 MG tablet take 1 and 1/2 tablet by mouth twice a day  0  . NOVOLOG FLEXPEN 100 UNIT/ML FlexPen inject 10 units SUBCUTANEOUSLY WITH MAIN MEAL  0  . potassium chloride SA (K-DUR,KLOR-CON) 20 MEQ tablet Take 40 mEq by mouth daily.  0  . rosuvastatin (CRESTOR) 20 MG tablet   0  . sertraline (ZOLOFT) 100 MG tablet     . sulfamethoxazole-trimethoprim (BACTRIM DS,SEPTRA DS) 800-160 MG tablet Take 1 tablet by mouth 2 (two) times daily. 28 tablet 0   No current facility-administered medications on file prior to visit.    Allergies  Allergen Reactions  . Levofloxacin Other (See Comments)    confusion    No results found for this or any previous visit (from the past 2160 hour(s)).  Objective: There were no vitals  filed for this visit.  General: Patient is awake, alert, oriented x 3 and in no acute distress.  Dermatology: Skin is warm and dry bilateral with a full thickness ulceration present Left central heel. Ulceration measures 1 cm x0.9 cm x 0.2 cm. There is a keratotic border with a granular base. The ulceration does not probe to bone no undermining, no malodor, no erythema, no edema.  To the lateral first toe and second toes ulcerations have healed with no surrounding swelling or cellulitis. No other acute signs of infection.  Pre-ulcerative callus sub met 1 bilateral and sub met 5 bilateral.   Vascular: Dorsalis Pedis pulse = 1/4 Bilateral,  Posterior Tibial pulse = 1/4 Bilateral,  Capillary Fill Time < 5 seconds  Neurologic: Protective sensation absent to the level of ankles using the 5.07/10g [employer].  Musculosketal: Mild pain with palpation to ulcerated area at left heel. No pain with compression to calves bilateral. + prominent metatarsals with fat pad atrophy deformities noted bilateral.  No results for input(s): GRAMSTAIN, LABORGA in the last 8760 hours.  Assessment and Plan:  Problem List Items Addressed This Visit    None    Visit Diagnoses    Heel ulceration, left, with fat layer exposed ([place])    -  Primary   Cellulitis and abscess of toe of left foot  Chronic diabetic ulcer of left foot determined by examination (HCC)       Fat pad atrophy of foot       Pre-ulcerative calluses         -Examined patient and Re-discussed the progression of the wound on left heel. - Excisionally dedbrided ulceration at left heel to healthy bleeding borders removing nonviable tissue using a sterile chisel blade. Wound measures post debridement as above. Wound was debrided to the level of the dermis with viable wound base exposed to promote healing. Hemostasis was achieved with manuel pressure.  Applied PRISMA to heel covered with dry sterile dressing.  Added heel cushion to  wound healing shoe.  Nursing to continue with Prisma to this area 3x weekly.  Updated orders sent to nurse. -Patient completed clindamycin - Advised patient to go to the ER or return to office if the wound worsens or if constitutional symptoms are present. -Continue with wound healing shoes. Advised patient again to use walker or wheelchair to help her keep pressure off left heel and to help steady herself to prevent risk of falling.  -Patient to return to office in 2 weeks for continued wound care   , DPM 

## 2017-04-26 ENCOUNTER — Telehealth: Payer: Self-pay | Admitting: *Deleted

## 2017-04-26 DIAGNOSIS — F0390 Unspecified dementia without behavioral disturbance: Secondary | ICD-10-CM | POA: Diagnosis not present

## 2017-04-26 DIAGNOSIS — Z48817 Encounter for surgical aftercare following surgery on the skin and subcutaneous tissue: Secondary | ICD-10-CM | POA: Diagnosis not present

## 2017-04-26 DIAGNOSIS — L97429 Non-pressure chronic ulcer of left heel and midfoot with unspecified severity: Secondary | ICD-10-CM | POA: Diagnosis not present

## 2017-04-26 DIAGNOSIS — E11621 Type 2 diabetes mellitus with foot ulcer: Secondary | ICD-10-CM | POA: Diagnosis not present

## 2017-04-26 DIAGNOSIS — E1140 Type 2 diabetes mellitus with diabetic neuropathy, unspecified: Secondary | ICD-10-CM | POA: Diagnosis not present

## 2017-04-26 DIAGNOSIS — L97421 Non-pressure chronic ulcer of left heel and midfoot limited to breakdown of skin: Secondary | ICD-10-CM | POA: Diagnosis not present

## 2017-04-26 NOTE — Telephone Encounter (Signed)
Faxed copy of 04/23/2017 6:01pm orders to [facility].

## 2017-04-26 NOTE — Telephone Encounter (Signed)
-----   Message from Landis Martins, [place] sent at 04/23/2017  6:01 PM EST ----- Regarding: Wound care home nursing orders  Prisma or collagen dressing to heel 3x per week covered with dry dressing. -Dr. Chauncey Cruel

## 2017-04-27 ENCOUNTER — Telehealth: Payer: Self-pay | Admitting: Sports Medicine

## 2017-04-27 NOTE — Telephone Encounter (Signed)
Left message stating Dr. Cannon Kettle had agreed with the [facility] visit schedule.

## 2017-04-27 NOTE — Telephone Encounter (Signed)
This is Armida Sans a home health nurse with [facility]. I was just calling to get orders from Dr. Cannon Kettle to see Cindy Hahn once a week for this week and then two times a week for the next three weeks. I can be reached back at [phone]. Thank you so very much. Bye bye.

## 2017-04-29 ENCOUNTER — Telehealth: Payer: Self-pay | Admitting: Sports Medicine

## 2017-04-29 DIAGNOSIS — L97429 Non-pressure chronic ulcer of left heel and midfoot with unspecified severity: Secondary | ICD-10-CM | POA: Diagnosis not present

## 2017-04-29 DIAGNOSIS — Z48817 Encounter for surgical aftercare following surgery on the skin and subcutaneous tissue: Secondary | ICD-10-CM | POA: Diagnosis not present

## 2017-04-29 DIAGNOSIS — F0390 Unspecified dementia without behavioral disturbance: Secondary | ICD-10-CM | POA: Diagnosis not present

## 2017-04-29 DIAGNOSIS — L97421 Non-pressure chronic ulcer of left heel and midfoot limited to breakdown of skin: Secondary | ICD-10-CM | POA: Diagnosis not present

## 2017-04-29 DIAGNOSIS — E1140 Type 2 diabetes mellitus with diabetic neuropathy, unspecified: Secondary | ICD-10-CM | POA: Diagnosis not present

## 2017-04-29 DIAGNOSIS — E11621 Type 2 diabetes mellitus with foot ulcer: Secondary | ICD-10-CM | POA: Diagnosis not present

## 2017-04-29 NOTE — Telephone Encounter (Signed)
Returned daughter's phone call confirmed the antibiotic that her mother had just completed and that we had not ordered any additional antibiotics.  Daughter to contact pharmacy as to additional antibiotic they were dispensed.

## 2017-04-29 NOTE — Telephone Encounter (Signed)
This is Cindy Hahn and I'm calling for my mother. I'm calling about the antibiotic that was picked up for [place] that we were unaware of. If either the nurse or doctor could please call me back at [phone]. Thank you.

## 2017-05-04 ENCOUNTER — Telehealth: Payer: Self-pay | Admitting: Sports Medicine

## 2017-05-04 DIAGNOSIS — L97429 Non-pressure chronic ulcer of left heel and midfoot with unspecified severity: Secondary | ICD-10-CM | POA: Diagnosis not present

## 2017-05-04 DIAGNOSIS — F0390 Unspecified dementia without behavioral disturbance: Secondary | ICD-10-CM | POA: Diagnosis not present

## 2017-05-04 DIAGNOSIS — E11621 Type 2 diabetes mellitus with foot ulcer: Secondary | ICD-10-CM | POA: Diagnosis not present

## 2017-05-04 DIAGNOSIS — Z48817 Encounter for surgical aftercare following surgery on the skin and subcutaneous tissue: Secondary | ICD-10-CM | POA: Diagnosis not present

## 2017-05-04 DIAGNOSIS — E1140 Type 2 diabetes mellitus with diabetic neuropathy, unspecified: Secondary | ICD-10-CM | POA: Diagnosis not present

## 2017-05-04 DIAGNOSIS — L97421 Non-pressure chronic ulcer of left heel and midfoot limited to breakdown of skin: Secondary | ICD-10-CM | POA: Diagnosis not present

## 2017-05-04 NOTE — Telephone Encounter (Addendum)
Cindy Hahn states last week left heel 0.2 x 0.2 cm, today 1.5 x 1.2 cm with minimal drainage, but pt complains of pain.

## 2017-05-04 NOTE — Telephone Encounter (Signed)
Pt's dtr, Cecille Rubin states to schedule pt to come in tomorrow. I transferred Cecille Rubin to schedulers.

## 2017-05-04 NOTE — Telephone Encounter (Signed)
Ok we can try to get her in sooner to have if looked at if they can not wait till 1/18

## 2017-05-04 NOTE — Telephone Encounter (Signed)
I spoke with pt's dtr, Cecille Rubin and informed of [place] [place] [facility] observation and offered an earlier appt. Cecille Rubin states the wound is not that bad it has changed and she would like to keep the 05/07/2017 appt. I told Cecille Rubin I would inform Dr.Stover and call if there were additional orders.

## 2017-05-04 NOTE — Telephone Encounter (Signed)
This is [name], a Nurse with [facility]. I'm out here seeing Cindy Hahn and the wound on her left heel has gotten incredibly bigger. If someone could give me a call back at [phone]. Just wanted to discuss this with a nurse or the doctor. Thank you very much.

## 2017-05-07 ENCOUNTER — Encounter: Payer: Self-pay | Admitting: Sports Medicine

## 2017-05-07 ENCOUNTER — Ambulatory Visit (INDEPENDENT_AMBULATORY_CARE_PROVIDER_SITE_OTHER): Payer: Medicare Other | Admitting: Sports Medicine

## 2017-05-07 DIAGNOSIS — L84 Corns and callosities: Secondary | ICD-10-CM

## 2017-05-07 DIAGNOSIS — R29898 Other symptoms and signs involving the musculoskeletal system: Secondary | ICD-10-CM

## 2017-05-07 DIAGNOSIS — L97422 Non-pressure chronic ulcer of left heel and midfoot with fat layer exposed: Secondary | ICD-10-CM | POA: Diagnosis not present

## 2017-05-07 DIAGNOSIS — L909 Atrophic disorder of skin, unspecified: Secondary | ICD-10-CM

## 2017-05-07 DIAGNOSIS — M216X9 Other acquired deformities of unspecified foot: Secondary | ICD-10-CM

## 2017-05-07 MED ORDER — MEDIHONEY WOUND/BURN DRESSING EX GEL: CUTANEOUS | 1 refills | Status: DC

## 2017-05-07 NOTE — Progress Notes (Signed)
Subjective: Cindy Hahn is a 83 y.o. female patient seen in office for follow up evaluation of ulceration of her left heel. Patient reports that she has some soreness and pain to her left heel sharp in nature when she walks feeling like something is being in.  Patient is assisted by daughter who reports that on Monday she did get a little bit of drainage from the wound.  Patient denies nausea/fever/vomiting/chills/night sweats/shortness of breath. Patient has no other pedal complaints at this time.  There are no active problems to display for this patient.  Current Outpatient Medications on File Prior to Visit  Medication Sig Dispense Refill  . ALPRAZolam (XANAX) 1 MG tablet Take 1 mg by mouth.    Marland Kitchen amoxicillin-clavulanate (AUGMENTIN) 875-125 MG tablet Take 1 tablet by mouth 2 (two) times daily. 28 tablet 0  . anastrozole (ARIMIDEX) 1 MG tablet take 1 tablet by mouth once daily to [place].  0  . aspirin EC 81 MG tablet Take 81 mg by mouth.    . clindamycin (CLEOCIN) 300 MG capsule Take 1 capsule (300 mg total) by mouth 3 (three) times daily. 30 capsule 0  . digoxin (LANOXIN) 0.125 MG tablet   0  . diltiazem (CARDIZEM) 60 MG tablet     . LEVEMIR 100 UNIT/ML injection     . metoprolol tartrate (LOPRESSOR) 50 MG tablet take 1 and 1/2 tablet by mouth twice a day  0  . NOVOLOG FLEXPEN 100 UNIT/ML FlexPen inject 10 units SUBCUTANEOUSLY WITH MAIN MEAL  0  . potassium chloride SA (K-DUR,KLOR-CON) 20 MEQ tablet Take 40 mEq by mouth daily.  0  . rosuvastatin (CRESTOR) 20 MG tablet   0  . sertraline (ZOLOFT) 100 MG tablet     . sulfamethoxazole-trimethoprim (BACTRIM DS,SEPTRA DS) 800-160 MG tablet Take 1 tablet by mouth 2 (two) times daily. 28 tablet 0   No current facility-administered medications on file prior to visit.    Allergies  Allergen Reactions  . Levofloxacin Other (See Comments)    confusion    No results found for this or any previous visit (from the  past 2160 hour(s)).  Objective: There were no vitals filed for this visit.  General: Patient is awake, alert, oriented x 3 and in no acute distress.  Dermatology: Skin is warm and dry bilateral with a full thickness ulceration present Left central heel. Ulceration measures in total 1 cm x1.1cm x 0.1 cm. There is a keratotic border with a granular base. The ulceration does not probe to bone no undermining, no malodor, no erythema, no edema.  Nails are short and minimally elongated.  No other acute signs of infection.  Pre-ulcerative callus sub met 1 bilateral and sub met 5 bilateral.   Vascular: Dorsalis Pedis pulse = 1/4 Bilateral,  Posterior Tibial pulse = 1/4 Bilateral,  Capillary Fill Time < 5 seconds  Neurologic: Protective sensation absent to the level of ankles using the 5.07/10g [employer].  Musculosketal: Mild pain with palpation to ulcerated area at left heel. No pain with compression to calves bilateral. + prominent metatarsals with fat pad atrophy deformities noted bilateral.  No results for input(s): GRAMSTAIN, LABORGA in the last 8760 hours.  Assessment and Plan:  Problem List Items Addressed This Visit    None    Visit Diagnoses    Heel ulceration, left, with fat layer exposed ([place])    -  Primary   Relevant Medications   Wound Dressings (MEDIHONEY WOUND/BURN DRESSING) GEL  Fat pad atrophy of foot       Pre-ulcerative calluses       Prominent metatarsal head, unspecified laterality         -Examined patient and Re-discussed the progression of the wound on left heel. - Excisionally dedbrided ulceration at left heel to healthy bleeding borders removing nonviable tissue using a sterile chisel blade. Wound measures post debridement as above. Wound was debrided to the level of the dermis with viable wound base exposed to promote healing. Hemostasis was achieved with manuel pressure.  Applied medihoney to heel covered with dry sterile dressing.  Modified  heel cushion on her shoe.  Advised patient to limit her activity at home. -Per daughter's request we will discontinue home nursing and daughter will change the dressing 3 times per week states that the home nursing company has not been ordering wound care supplies as they told him they would and she is unhappy with the care is currently being provided this will result to changing the dressings herself.  Ordered dressing supplies for her to do so from [facility]. - Advised patient to go to the ER or return to office if the wound worsens or if constitutional symptoms are present. -Complementary mechanically debrided all nails x10 without incident using a sterile nail nipper -All questions answered -Patient to return to office in 2-3 weeks for continued wound care  Landis Martins, DPM

## 2017-05-10 ENCOUNTER — Telehealth: Payer: Self-pay | Admitting: *Deleted

## 2017-05-10 NOTE — Telephone Encounter (Signed)
Dr. Cannon Kettle ordered Medihoney, 4 x 4 gauze, Kerlix and ace wraps for left heel ulcer L97.422 measuring 1.0 x 1.1 x 0.1cm for dressing changes 3 x week, faxed to [facility]. Faxed Discontinue Home health care to [facility].

## 2017-05-10 NOTE — Telephone Encounter (Signed)
-----   Message from Landis Martins, [place] sent at 05/07/2017  5:26 PM EST ----- Regarding: Discontinue home nursing and order supplies [facility] Per patient's daughter home nursing not keeping up with supplies and she's not happy. Please discontinue home nursing. Daughter will do dressing changes 3x per week.   Please order wound care supplies from [facility] for them Medihoney gel, 4x4, kerlix, and ACE wraps. The wound size today was 1.1x1x0.1cm Thanks Dr. Cannon Kettle

## 2017-05-11 ENCOUNTER — Telehealth: Payer: Self-pay | Admitting: Sports Medicine

## 2017-05-11 DIAGNOSIS — L97429 Non-pressure chronic ulcer of left heel and midfoot with unspecified severity: Secondary | ICD-10-CM | POA: Diagnosis not present

## 2017-05-11 DIAGNOSIS — E1140 Type 2 diabetes mellitus with diabetic neuropathy, unspecified: Secondary | ICD-10-CM | POA: Diagnosis not present

## 2017-05-11 DIAGNOSIS — Z48817 Encounter for surgical aftercare following surgery on the skin and subcutaneous tissue: Secondary | ICD-10-CM | POA: Diagnosis not present

## 2017-05-11 DIAGNOSIS — L97421 Non-pressure chronic ulcer of left heel and midfoot limited to breakdown of skin: Secondary | ICD-10-CM | POA: Diagnosis not present

## 2017-05-11 DIAGNOSIS — E11621 Type 2 diabetes mellitus with foot ulcer: Secondary | ICD-10-CM | POA: Diagnosis not present

## 2017-05-11 DIAGNOSIS — F0390 Unspecified dementia without behavioral disturbance: Secondary | ICD-10-CM | POA: Diagnosis not present

## 2017-05-11 NOTE — Telephone Encounter (Signed)
Thanks. The daughter will be doing her wound care. -Dr. Cannon Kettle

## 2017-05-11 NOTE — Telephone Encounter (Signed)
I am calling in regards to [place]. I wanted to let Dr. Cannon Kettle know that I just discharged her from nursing services today. If she has any questions she can call me at [phone]. Thanks and have a great day.

## 2017-05-19 ENCOUNTER — Ambulatory Visit: Payer: Medicare Other | Admitting: Cardiology

## 2017-05-26 ENCOUNTER — Encounter: Payer: Self-pay | Admitting: Sports Medicine

## 2017-05-26 ENCOUNTER — Ambulatory Visit (INDEPENDENT_AMBULATORY_CARE_PROVIDER_SITE_OTHER): Payer: Medicare Other | Admitting: Sports Medicine

## 2017-05-26 DIAGNOSIS — L84 Corns and callosities: Secondary | ICD-10-CM

## 2017-05-26 DIAGNOSIS — L97521 Non-pressure chronic ulcer of other part of left foot limited to breakdown of skin: Secondary | ICD-10-CM

## 2017-05-26 DIAGNOSIS — L97422 Non-pressure chronic ulcer of left heel and midfoot with fat layer exposed: Secondary | ICD-10-CM

## 2017-05-26 DIAGNOSIS — R29898 Other symptoms and signs involving the musculoskeletal system: Secondary | ICD-10-CM

## 2017-05-26 DIAGNOSIS — L909 Atrophic disorder of skin, unspecified: Secondary | ICD-10-CM

## 2017-05-26 NOTE — Progress Notes (Signed)
Subjective: Cindy Hahn is a 83 y.o. female patient seen in office for follow up evaluation of ulceration of her left heel and toes. Patient reports that she has some soreness to heel and experienced one drop of brown drainage otherwise the heel has been dry.  Patient is assisted by daughter who reports that she has been changing the dressing 3 times per week.  Patient denies nausea/fever/vomiting/chills/night sweats/shortness of breath. Patient has no other pedal complaints at this time.  Patient Active Problem List   Diagnosis Date Noted  . Diabetic polyneuropathy associated with type 2 diabetes mellitus ([place]) 06/04/2015  . Skin ulcer of left heel, limited to breakdown of skin ([place]) 06/04/2015  . Pacemaker 03/11/2015  . Bradycardia 02/06/2015  . Chronic atrial fibrillation ([place]) 11/01/2014  . Essential hypertension 11/01/2014  . History of stroke 11/01/2014   Current Outpatient Medications on File Prior to Visit  Medication Sig Dispense Refill  . ALPRAZolam (XANAX) 1 MG tablet Take 1 mg by mouth.    Marland Kitchen amoxicillin-clavulanate (AUGMENTIN) 875-125 MG tablet Take 1 tablet by mouth 2 (two) times daily. 28 tablet 0  . anastrozole (ARIMIDEX) 1 MG tablet take 1 tablet by mouth once daily to [place].  0  . aspirin EC 81 MG tablet Take 81 mg by mouth.    . clindamycin (CLEOCIN) 300 MG capsule Take 1 capsule (300 mg total) by mouth 3 (three) times daily. 30 capsule 0  . digoxin (LANOXIN) 0.125 MG tablet   0  . diltiazem (CARDIZEM) 60 MG tablet     . LEVEMIR 100 UNIT/ML injection     . metoprolol tartrate (LOPRESSOR) 50 MG tablet take 1 and 1/2 tablet by mouth twice a day  0  . NOVOLOG FLEXPEN 100 UNIT/ML FlexPen inject 10 units SUBCUTANEOUSLY WITH MAIN MEAL  0  . potassium chloride SA (K-DUR,KLOR-CON) 20 MEQ tablet Take 40 mEq by mouth daily.  0  . rosuvastatin (CRESTOR) 20 MG tablet   0  . sertraline (ZOLOFT) 100 MG tablet     . sulfamethoxazole-trimethoprim  (BACTRIM DS,SEPTRA DS) 800-160 MG tablet Take 1 tablet by mouth 2 (two) times daily. 28 tablet 0  . Wound Dressings (MEDIHONEY WOUND/BURN DRESSING) GEL Apply a small amount to heel ulceration 3x per week 44 mL 1   No current facility-administered medications on file prior to visit.    Allergies  Allergen Reactions  . Levofloxacin Other (See Comments)    confusion    No results found for this or any previous visit (from the past 2160 hour(s)).  Objective: There were no vitals filed for this visit.  General: Patient is awake, alert, oriented x 3 and in no acute distress.  Dermatology: Skin is warm and dry bilateral with a now prematurely healed full thickness ulceration present Left central heel with reactive keratosis. There is a keratotic border.There is no undermining, no malodor, no erythema, no edema. No other acute signs of infection.  To plantar left 2nd toe there is abrasion with no signs of infection.  Pre-ulcerative callus sub met 1 bilateral and sub met 5 bilateral.   Nails short, thick, and mildly discolored.   Vascular: Dorsalis Pedis pulse = 1/4 Bilateral,  Posterior Tibial pulse = 1/4 Bilateral,  Capillary Fill Time < 5 seconds  Neurologic: Protective sensation absent to the level of ankles using the 5.07/10g [employer].  Musculosketal: Mild pain with palpation to previous ulcerated area at left heel. No pain with compression to calves bilateral. +  prominent metatarsals with fat pad atrophy deformities noted bilateral.  No results for input(s): GRAMSTAIN, LABORGA in the last 8760 hours.  Assessment and Plan:  Problem List Items Addressed This Visit    None    Visit Diagnoses    Heel ulceration, left, with fat layer exposed ([place])    -  Primary   Toe ulcer, left, limited to breakdown of skin ([place])       Fat pad atrophy of foot       Pre-ulcerative calluses         -Examined patient and Re-discussed the progression of the wound on left  heel. - Excisionally dedbrided reactive keratosis at left heel using a sterile chisel blade. Applied medihoney to heel and 2nd toe covered with dry sterile dressing.  Modified heel cushion on her shoe.   -Daughter to continue with medihoney to wounds 3x per week - Advised patient to go to the ER or return to office if the wound worsens or if constitutional symptoms are present. -All questions answered -Patient to return to office in 2-3 weeks for continued wound care  Landis Martins, DPM

## 2017-05-27 ENCOUNTER — Ambulatory Visit (INDEPENDENT_AMBULATORY_CARE_PROVIDER_SITE_OTHER): Payer: Medicare Other | Admitting: Cardiology

## 2017-05-27 ENCOUNTER — Encounter: Payer: Self-pay | Admitting: Cardiology

## 2017-05-27 VITALS — BP 98/68 | HR 89 | Ht 66.0 in | Wt 135.0 lb

## 2017-05-27 DIAGNOSIS — E1142 Type 2 diabetes mellitus with diabetic polyneuropathy: Secondary | ICD-10-CM | POA: Diagnosis not present

## 2017-05-27 DIAGNOSIS — I482 Chronic atrial fibrillation, unspecified: Secondary | ICD-10-CM

## 2017-05-27 DIAGNOSIS — Z950 Presence of cardiac pacemaker: Secondary | ICD-10-CM | POA: Diagnosis not present

## 2017-05-27 DIAGNOSIS — I10 Essential (primary) hypertension: Secondary | ICD-10-CM | POA: Diagnosis not present

## 2017-05-27 DIAGNOSIS — Z8673 Personal history of transient ischemic attack (TIA), and cerebral infarction without residual deficits: Secondary | ICD-10-CM | POA: Diagnosis not present

## 2017-05-27 NOTE — Progress Notes (Signed)
Cardiology Office Note:    Date:  05/27/2017   ID:  BOBIE CARIS, DOB [birth year], MRN [id number]  PCP:  Ronita Hipps, MD  Cardiologist:  Jenne Campus, MD    Referring MD: Ronita Hipps, MD   Chief Complaint  Patient presents with  . Annual Exam  Doing well but dizzy  History of Present Illness:    YURIANA GAAL is a 83 y.o. female with chronic atrial fibrillation rate appears to be controlled.  She is not anticoagulated because of frequent falls.  The biggest complaint she has is dizziness she has very unstable gait difficult for her to walk and she got multiple falls that is why she is not anticoagulated.  Does have any chest pain tightness squeezing pressure burning chest.  Shortness of breath happens typically with exertion but again she does not do much only simple walking with some assistance.  Past Medical History:  Diagnosis Date  . A-fib ([place])   . Cancer ([place])   . Dementia   . Diabetes mellitus without complication ([place])   . Hyperlipidemia   . Hypertension   . Stroke [facility])     Past Surgical History:  Procedure Laterality Date  . ABDOMINAL HYSTERECTOMY    . BLADDER SURGERY    . CATARACT EXTRACTION    . ILEOCECETOMY    . KNEE SURGERY    . OOPHORECTOMY    . SHOULDER SURGERY    . WRIST SURGERY      Current Medications: Current Meds  Medication Sig  . ALPRAZolam (XANAX) 1 MG tablet Take 1 mg by mouth.  Marland Kitchen anastrozole (ARIMIDEX) 1 MG tablet take 1 tablet by mouth once daily to [place].  Marland Kitchen aspirin EC 81 MG tablet Take 81 mg by mouth.  . digoxin (LANOXIN) 0.125 MG tablet Take 0.0625 mg by mouth daily.   Marland Kitchen diltiazem (CARDIZEM) 60 MG tablet   . LEVEMIR 100 UNIT/ML injection   . metoprolol tartrate (LOPRESSOR) 50 MG tablet take 1 and 1/2 tablet by mouth twice a day  . NOVOLOG FLEXPEN 100 UNIT/ML FlexPen inject 10 units SUBCUTANEOUSLY WITH MAIN MEAL  . potassium chloride SA (K-DUR,KLOR-CON) 20 MEQ tablet Take 20 mEq by mouth  daily.   . rosuvastatin (CRESTOR) 20 MG tablet   . sertraline (ZOLOFT) 100 MG tablet   . Wound Dressings (MEDIHONEY WOUND/BURN DRESSING) GEL Apply a small amount to heel ulceration 3x per week     Allergies:   Levofloxacin   Social History   Socioeconomic History  . Marital status: Widowed    Spouse name: None  . Number of children: None  . Years of education: None  . Highest education level: None  Social Needs  . Financial resource strain: None  . Food insecurity - worry: None  . Food insecurity - inability: None  . Transportation needs - medical: None  . Transportation needs - non-medical: None  Occupational History  . None  Tobacco Use  . Smoking status: Former Research scientist (life sciences)  . Smokeless tobacco: Never Used  Substance and Sexual Activity  . Alcohol use: No    Frequency: Never  . Drug use: No  . Sexual activity: None  Other Topics Concern  . None  Social History Narrative  . None     Family History: The patient's family history includes Cancer in her father; Cardiomyopathy in her mother; Diabetes in her mother; Heart disease in her father; Tuberculosis in her mother. ROS:   Please see the history of  present illness.    All 14 point review of systems negative except as described per history of present illness  EKGs/Labs/Other Studies Reviewed:      Recent Labs: No results found for requested labs within last 8760 hours.  Recent Lipid Panel No results found for: CHOL, TRIG, HDL, CHOLHDL, VLDL, LDLCALC, LDLDIRECT  Physical Exam:    VS:  BP 98/68 (BP Location: Right Arm, Patient Position: Sitting, Cuff Size: Normal)   Pulse 89   Ht 5\' 6"  (1.676 m)   Wt 135 lb (61.2 kg)   SpO2 91%   BMI 21.79 kg/m     Wt Readings from Last 3 Encounters:  05/27/17 135 lb (61.2 kg)  11/19/16 124 lb (56.2 kg)     GEN:  Well nourished, well developed in no acute distress HEENT: Normal NECK: No JVD; No carotid bruits LYMPHATICS: No lymphadenopathy CARDIAC: Irregularly  irregular, no murmurs, no rubs, no gallops RESPIRATORY:  Clear to auscultation without rales, wheezing or rhonchi  ABDOMEN: Soft, non-tender, non-distended MUSCULOSKELETAL:  No edema; No deformity  SKIN: Warm and dry LOWER EXTREMITIES: no swelling NEUROLOGIC:  Alert and oriented x 3 PSYCHIATRIC:  Normal affect   ASSESSMENT:    1. Chronic atrial fibrillation ([place])   2. Essential hypertension   3. Diabetic polyneuropathy associated with type 2 diabetes mellitus ([place])   4. Pacemaker   5. History of stroke    PLAN:    In order of problems listed above:  1. Chronic atrial fibrillation: Rate appears to be controlled.  Will interrogate her device today and get slightly better understanding about what her rate control is like.  She is not anticoagulated because of frequent falls but she does have quite significantly high chads vas score.  I initiated conversation about watchman device.  I gave her brochure and information to her daughter and we talked at length about that they said they will consider. 2. Essential hypertension: Blood pressure well controlled on medications which I will continue. 3. Pacemaker: Is a [employer] device we will interrogate today 4. History of CVA: Stable: Discussion with her about watchman initiated.   Medication Adjustments/Labs and Tests Ordered: Current medicines are reviewed at length with the patient today.  Concerns regarding medicines are outlined above.  No orders of the defined types were placed in this encounter.  Medication changes: No orders of the defined types were placed in this encounter.   Signed, Park Liter, MD, [facility] 05/27/2017 3:47 PM    [facility]

## 2017-05-27 NOTE — Patient Instructions (Signed)
Medication Instructions:  Your physician recommends that you continue on your current medications as directed. Please refer to the Current Medication list given to you today.   Labwork: None  Testing/Procedures: None  Follow-Up: Your physician wants you to follow-up in: 6 months with Dr. Agustin Cree.  You will receive a reminder letter in the mail two months in advance. If you don't receive a letter, please call our office to schedule the follow-up appointment.   Any Other Special Instructions Will Be Listed Below (If Applicable).     If you need a refill on your cardiac medications before your next appointment, please call your pharmacy.

## 2017-06-17 ENCOUNTER — Ambulatory Visit (INDEPENDENT_AMBULATORY_CARE_PROVIDER_SITE_OTHER): Payer: Medicare Other | Admitting: Sports Medicine

## 2017-06-17 ENCOUNTER — Encounter: Payer: Self-pay | Admitting: Sports Medicine

## 2017-06-17 DIAGNOSIS — IMO0002 Reserved for concepts with insufficient information to code with codable children: Secondary | ICD-10-CM

## 2017-06-17 DIAGNOSIS — R29898 Other symptoms and signs involving the musculoskeletal system: Secondary | ICD-10-CM

## 2017-06-17 DIAGNOSIS — E1165 Type 2 diabetes mellitus with hyperglycemia: Secondary | ICD-10-CM

## 2017-06-17 DIAGNOSIS — L97421 Non-pressure chronic ulcer of left heel and midfoot limited to breakdown of skin: Secondary | ICD-10-CM

## 2017-06-17 DIAGNOSIS — M79671 Pain in right foot: Secondary | ICD-10-CM

## 2017-06-17 DIAGNOSIS — L909 Atrophic disorder of skin, unspecified: Secondary | ICD-10-CM

## 2017-06-17 DIAGNOSIS — M79672 Pain in left foot: Secondary | ICD-10-CM

## 2017-06-17 DIAGNOSIS — L97521 Non-pressure chronic ulcer of other part of left foot limited to breakdown of skin: Secondary | ICD-10-CM

## 2017-06-17 DIAGNOSIS — E1140 Type 2 diabetes mellitus with diabetic neuropathy, unspecified: Secondary | ICD-10-CM

## 2017-06-17 DIAGNOSIS — M216X9 Other acquired deformities of unspecified foot: Secondary | ICD-10-CM

## 2017-06-17 DIAGNOSIS — L84 Corns and callosities: Secondary | ICD-10-CM

## 2017-06-17 NOTE — Progress Notes (Signed)
Subjective: Cindy Hahn is a 83 y.o. female patient seen in office for follow up evaluation of ulceration of her left heel and second toe. Patient reports that she has also experienced a rash that has been burning.   Patient is assisted by daughter who reports that she has been changing the dressing 3 times per week using medihoney which seems to be helping.  Admits to one episode of bloody drainage from the heel.  Patient denies nausea/fever/vomiting/chills/night sweats/shortness of breath. Patient has no other pedal complaints at this time.  Patient Active Problem List   Diagnosis Date Noted  . Diabetic polyneuropathy associated with type 2 diabetes mellitus ([place]) 06/04/2015  . Skin ulcer of left heel, limited to breakdown of skin ([place]) 06/04/2015  . Pacemaker 03/11/2015  . Bradycardia 02/06/2015  . Chronic atrial fibrillation ([place]) 11/01/2014  . Essential hypertension 11/01/2014  . History of stroke 11/01/2014   Current Outpatient Medications on File Prior to Visit  Medication Sig Dispense Refill  . ALPRAZolam (XANAX) 1 MG tablet Take 1 mg by mouth.    Marland Kitchen anastrozole (ARIMIDEX) 1 MG tablet take 1 tablet by mouth once daily to [place].  0  . aspirin EC 81 MG tablet Take 81 mg by mouth.    . digoxin (LANOXIN) 0.125 MG tablet Take 0.0625 mg by mouth daily.   0  . diltiazem (CARDIZEM) 60 MG tablet     . LEVEMIR 100 UNIT/ML injection     . metoprolol tartrate (LOPRESSOR) 50 MG tablet take 1 and 1/2 tablet by mouth twice a day  0  . NOVOLOG FLEXPEN 100 UNIT/ML FlexPen inject 10 units SUBCUTANEOUSLY WITH MAIN MEAL  0  . potassium chloride SA (K-DUR,KLOR-CON) 20 MEQ tablet Take 20 mEq by mouth daily.   0  . rosuvastatin (CRESTOR) 20 MG tablet   0  . sertraline (ZOLOFT) 100 MG tablet     . Wound Dressings (MEDIHONEY WOUND/BURN DRESSING) GEL Apply a small amount to heel ulceration 3x per week 44 mL 1   No current facility-administered medications on file prior to  visit.    Allergies  Allergen Reactions  . Levofloxacin Other (See Comments)    confusion    No results found for this or any previous visit (from the past 2160 hour(s)).  Objective: There were no vitals filed for this visit.  General: Patient is awake, alert, oriented x 3 and in no acute distress.  Dermatology: Skin is warm and dry bilateral with a pinpoint opening measuring less than 1 mm present Left central heel with reactive keratosis. There is a keratotic border.There is no undermining, no malodor, no erythema, no edema. No other acute signs of infection.  To plantar left 2nd toe there is abrasion with dry scab no signs of infection.  Pre-ulcerative callus sub met 1 bilateral and sub met 5 bilateral.  There is mild bleeding at the left submental and callus with no acute signs of infection.  Nails short, thick, and mildly discolored.   Vascular: Dorsalis Pedis pulse = 1/4 Bilateral,  Posterior Tibial pulse = 1/4 Bilateral,  Capillary Fill Time < 5 seconds  Neurologic: Protective sensation absent to the level of ankles using the 5.07/10g [employer].  Musculosketal: Mild pain with palpation to previous ulcerated area at left heel. No pain with compression to calves bilateral. + prominent metatarsals with fat pad atrophy deformities noted bilateral.  No results for input(s): GRAMSTAIN, LABORGA in the last 8760 hours.  Assessment and Plan:  Problem List Items Addressed This Visit    None    Visit Diagnoses    Heel ulcer, left, limited to breakdown of skin ([place])    -  Primary   Toe ulcer, left, limited to breakdown of skin ([facility])       Fat pad atrophy of foot       Pre-ulcerative calluses       Prominent metatarsal head, unspecified laterality       Uncontrolled type 2 diabetes with neuropathy ([place])       Foot pain, bilateral         -Examined patient and Re-discussed the progression of the wound on left heel. - Excisionally dedbrided reactive keratosis  at left heel using a sterile chisel blade. Applied medihoney to heel and pre-ulcerative callus sub-met one on left where there was a little bit of bloody drainage covered with dry sterile dressing.  Added heel cushion on her shoes. -Complementary trimmed pre-ulcerative callus lesions bilateral at nonprocedural charge using a sterile chisel blade without incident -Daughter to continue with medihoney to wounds 3x per week - Advised patient to go to the ER or return to office if the wound worsens or if constitutional symptoms are present. -All questions answered -Patient to return to office in 2-3 weeks for continued wound care  Landis Martins, DPM

## 2017-06-29 ENCOUNTER — Other Ambulatory Visit: Payer: Self-pay | Admitting: Cardiology

## 2017-07-05 ENCOUNTER — Telehealth: Payer: Self-pay | Admitting: *Deleted

## 2017-07-05 NOTE — Telephone Encounter (Signed)
Yes please order Augmentin 875mg  bid 28 tabs Thanks Dr. Chauncey Cruel

## 2017-07-05 NOTE — Telephone Encounter (Signed)
Daughter called stating that left heel started with a thick drainage is red, angry and very sore.  She opened it slightly and pushed a lot of pus out looks like it has tunneled.  She has an appointment on Thursday but wanted to know if you would call in an antibiotic?

## 2017-07-06 MED ORDER — AMOXICILLIN-POT CLAVULANATE 875-125 MG PO TABS: 1.0000 | ORAL_TABLET | Freq: Two times a day (BID) | ORAL | 0 refills | Status: DC

## 2017-07-06 NOTE — Telephone Encounter (Signed)
Notified daughter Rx was called in

## 2017-07-08 ENCOUNTER — Ambulatory Visit (INDEPENDENT_AMBULATORY_CARE_PROVIDER_SITE_OTHER): Payer: Medicare Other | Admitting: Sports Medicine

## 2017-07-08 ENCOUNTER — Encounter: Payer: Self-pay | Admitting: Sports Medicine

## 2017-07-08 DIAGNOSIS — L02612 Cutaneous abscess of left foot: Secondary | ICD-10-CM | POA: Diagnosis not present

## 2017-07-08 DIAGNOSIS — S90822A Blister (nonthermal), left foot, initial encounter: Secondary | ICD-10-CM

## 2017-07-08 DIAGNOSIS — L84 Corns and callosities: Secondary | ICD-10-CM

## 2017-07-08 DIAGNOSIS — L97421 Non-pressure chronic ulcer of left heel and midfoot limited to breakdown of skin: Secondary | ICD-10-CM

## 2017-07-08 DIAGNOSIS — M216X9 Other acquired deformities of unspecified foot: Secondary | ICD-10-CM

## 2017-07-08 DIAGNOSIS — E1165 Type 2 diabetes mellitus with hyperglycemia: Secondary | ICD-10-CM

## 2017-07-08 DIAGNOSIS — IMO0002 Reserved for concepts with insufficient information to code with codable children: Secondary | ICD-10-CM

## 2017-07-08 DIAGNOSIS — E1140 Type 2 diabetes mellitus with diabetic neuropathy, unspecified: Secondary | ICD-10-CM

## 2017-07-08 MED ORDER — DAKINS (1/2 STRENGTH) 0.25 % EX SOLN: CUTANEOUS | 0 refills | Status: DC

## 2017-07-08 NOTE — Progress Notes (Addendum)
Subjective: Cindy Hahn is a 83 y.o. female patient seen in office for follow up evaluation of ulceration of her left heel and pre-ulcerative calluses.  Patient is assisted by daughter who admits to a blister starting on Saturday that she popped and got some bloody drainage from it.  Patient has been on Augmentin since Tuesday with no problems or issues.  Patient overall states that she feels fine denies any nausea vomiting fever or chills.  Her patient daughter states that she is wearing shoes that rub into the back of her heels.  No other issues noted.  Patient Active Problem List   Diagnosis Date Noted  . Diabetic polyneuropathy associated with type 2 diabetes mellitus ([place]) 06/04/2015  . Skin ulcer of left heel, limited to breakdown of skin ([place]) 06/04/2015  . Pacemaker 03/11/2015  . Bradycardia 02/06/2015  . Chronic atrial fibrillation ([place]) 11/01/2014  . Essential hypertension 11/01/2014  . History of stroke 11/01/2014   Current Outpatient Medications on File Prior to Visit  Medication Sig Dispense Refill  . ALPRAZolam (XANAX) 1 MG tablet Take 1 mg by mouth.    Marland Kitchen amoxicillin-clavulanate (AUGMENTIN) 875-125 MG tablet Take 1 tablet by mouth 2 (two) times daily. 28 tablet 0  . anastrozole (ARIMIDEX) 1 MG tablet take 1 tablet by mouth once daily to [place].  0  . aspirin EC 81 MG tablet Take 81 mg by mouth.    . digoxin (LANOXIN) 0.125 MG tablet Take 0.0625 mg by mouth daily.   0  . diltiazem (CARDIZEM) 60 MG tablet     . LEVEMIR 100 UNIT/ML injection     . metoprolol tartrate (LOPRESSOR) 50 MG tablet take 1 and 1/2 tablet by mouth twice a day  0  . NOVOLOG FLEXPEN 100 UNIT/ML FlexPen inject 10 units SUBCUTANEOUSLY WITH MAIN MEAL  0  . potassium chloride SA (K-DUR,KLOR-CON) 20 MEQ tablet Take 20 mEq by mouth daily.   0  . rosuvastatin (CRESTOR) 20 MG tablet   0  . sertraline (ZOLOFT) 100 MG tablet     . Wound Dressings (MEDIHONEY WOUND/BURN DRESSING) GEL  Apply a small amount to heel ulceration 3x per week 44 mL 1   No current facility-administered medications on file prior to visit.    Allergies  Allergen Reactions  . Levofloxacin Other (See Comments)    confusion    No results found for this or any previous visit (from the past 2160 hour(s)).  Objective: There were no vitals filed for this visit.  General: Patient is awake, alert, oriented x 3 and in no acute distress.  Dermatology: Skin is warm and dry bilateral with a partial thickness ulceration present to the left heel that measures less than 0.5 cm that tunnels to the medial aspect of the heel where there is significant raised hemorrhagic blister once debrided malodorous bloody drainage was evacuated from the blister bed malodor, positive focal edema, positive focal erythema.  No warmth. No other acute signs of infection.  To plantar left 2nd toe is healed.  Pre-ulcerative callus sub met 1 bilateral and sub met 5 bilateral.  There is mild bleeding at the left submet 1 and 5 callus with no acute signs of infection.  Nails short, thick, and mildly discolored.   Vascular: Dorsalis Pedis pulse = 1/4 Bilateral,  Posterior Tibial pulse = 1/4 Bilateral,  Capillary Fill Time < 5 seconds  Neurologic: Protective sensation absent to the level of ankles using the 5.07/10g [employer].  Musculosketal:  Mild pain with palpation to ulcerated and blistered area at left heel. No pain with compression to calves bilateral. + prominent metatarsals with fat pad atrophy deformities noted bilateral.  No results for input(s): GRAMSTAIN, LABORGA in the last 8760 hours.  Assessment and Plan:  Problem List Items Addressed This Visit    None    Visit Diagnoses    Heel ulcer, left, limited to breakdown of skin ([place])    -  Primary   Relevant Medications   sodium hypochlorite (DAKIN'S 1/2 STRENGTH) external solution   Other Relevant Orders   WOUND CULTURE   Abscess of left foot        Blister of left foot, initial encounter       Pre-ulcerative calluses       Prominent metatarsal head, unspecified laterality       Uncontrolled type 2 diabetes with neuropathy ([place])         -Examined patient and Re-discussed the progression of the wound on left heel with now blister and acute localized infection - Excisionally dedbrided reactive keratosis at left heel and evacuated blister using a sterile chisel blade.  Wound culture obtained.  Gently flushed the area with gentamicin impregnated saline solution and dressed area with offloading dry sterile dressing.  Prescribed Dakin's solution for daughter to use to the area until healed or resolved.  Advised daughter if there is maceration to refrain from excessive use of the Dakin's and just to apply dry dressing.   -Continue with Augmentin -Provided additional heel cushions for her shoes. -Complementary trimmed pre-ulcerative callus lesions bilateral at nonprocedural charge using a sterile chisel blade without incident - Advised patient to go to the ER or return to office if the wound worsens or if constitutional symptoms are present. -Patient to return to office in 2 weeks for continued wound care  Landis Martins, DPM

## 2017-07-10 LAB — WOUND CULTURE

## 2017-07-21 ENCOUNTER — Encounter: Payer: Self-pay | Admitting: Sports Medicine

## 2017-07-21 ENCOUNTER — Ambulatory Visit (INDEPENDENT_AMBULATORY_CARE_PROVIDER_SITE_OTHER): Payer: Medicare Other | Admitting: Sports Medicine

## 2017-07-21 DIAGNOSIS — L97421 Non-pressure chronic ulcer of left heel and midfoot limited to breakdown of skin: Secondary | ICD-10-CM

## 2017-07-21 DIAGNOSIS — IMO0002 Reserved for concepts with insufficient information to code with codable children: Secondary | ICD-10-CM

## 2017-07-21 DIAGNOSIS — E1140 Type 2 diabetes mellitus with diabetic neuropathy, unspecified: Secondary | ICD-10-CM | POA: Diagnosis not present

## 2017-07-21 DIAGNOSIS — L84 Corns and callosities: Secondary | ICD-10-CM

## 2017-07-21 DIAGNOSIS — E1165 Type 2 diabetes mellitus with hyperglycemia: Secondary | ICD-10-CM

## 2017-07-21 DIAGNOSIS — M216X9 Other acquired deformities of unspecified foot: Secondary | ICD-10-CM

## 2017-07-21 NOTE — Progress Notes (Signed)
Subjective: Cindy Hahn is a 83 y.o. female patient seen in office for follow up evaluation of ulceration of her left heel and pre-ulcerative calluses.  Patient is assisted by daughter who reports that the area has dried up very well with the use of the Dakin's solution and that her mom has not complained of any pain or discomfort over the heel area.  Patient overall states that she feels fine denies any nausea vomiting fever or chills.  No other issues noted.  Augmentin completed.  Patient Active Problem List   Diagnosis Date Noted  . Diabetic polyneuropathy associated with type 2 diabetes mellitus ([place]) 06/04/2015  . Skin ulcer of left heel, limited to breakdown of skin ([place]) 06/04/2015  . Pacemaker 03/11/2015  . Bradycardia 02/06/2015  . Chronic atrial fibrillation ([place]) 11/01/2014  . Essential hypertension 11/01/2014  . History of stroke 11/01/2014   Current Outpatient Medications on File Prior to Visit  Medication Sig Dispense Refill  . ALPRAZolam (XANAX) 1 MG tablet Take 1 mg by mouth.    Marland Kitchen amoxicillin-clavulanate (AUGMENTIN) 875-125 MG tablet Take 1 tablet by mouth 2 (two) times daily. 28 tablet 0  . anastrozole (ARIMIDEX) 1 MG tablet take 1 tablet by mouth once daily to [place].  0  . aspirin EC 81 MG tablet Take 81 mg by mouth.    . digoxin (LANOXIN) 0.125 MG tablet Take 0.0625 mg by mouth daily.   0  . diltiazem (CARDIZEM) 60 MG tablet     . LEVEMIR 100 UNIT/ML injection     . metoprolol tartrate (LOPRESSOR) 50 MG tablet take 1 and 1/2 tablet by mouth twice a day  0  . NOVOLOG FLEXPEN 100 UNIT/ML FlexPen inject 10 units SUBCUTANEOUSLY WITH MAIN MEAL  0  . potassium chloride SA (K-DUR,KLOR-CON) 20 MEQ tablet Take 20 mEq by mouth daily.   0  . rosuvastatin (CRESTOR) 20 MG tablet   0  . sertraline (ZOLOFT) 100 MG tablet     . sodium hypochlorite (DAKIN'S 1/2 STRENGTH) external solution Use to left heel ulceration until improved in appreance 473 mL 0   . Wound Dressings (MEDIHONEY WOUND/BURN DRESSING) GEL Apply a small amount to heel ulceration 3x per week 44 mL 1   No current facility-administered medications on file prior to visit.    Allergies  Allergen Reactions  . Levofloxacin Other (See Comments)    confusion    Recent Results (from the past 2160 hour(s))  WOUND CULTURE     Status: None   Collection Time: 07/08/17  4:19 PM  Result Value Ref Range   Gram Stain Result Final report    Organism ID, Bacteria Comment     Comment: No white blood cells seen.   Organism ID, Bacteria Comment     Comment: Rare gram negative diplococci   Aerobic Bacterial Culture Final report    Organism ID, Bacteria Mixed skin flora     Comment: Heavy growth    Objective: There were no vitals filed for this visit.  General: Patient is awake, alert, oriented x 3 and in no acute distress.  Dermatology: Skin is warm and dry bilateral with a healed ulceration plantar aspect of left heel there is mild reactive callus tissue with no fluctuance, no drainage, no warmth. No other acute signs of infection.  To plantar left 2nd toe is healed.  Pre-ulcerative callus sub met 1 bilateral and sub met 5 bilateral.  There is mild bleeding at the left submet 1 and  5 callus with no acute signs of infection.   Nails short, thick, and mildly discolored.   Vascular: Dorsalis Pedis pulse = 1/4 Bilateral,  Posterior Tibial pulse = 1/4 Bilateral,  Capillary Fill Time < 5 seconds  Neurologic: Protective sensation absent to the level of ankles using the 5.07/10g [employer].  Musculosketal: Mild pain with palpation to ulcerated and blistered area at left heel. No pain with compression to calves bilateral. + prominent metatarsals with fat pad atrophy deformities noted bilateral.  No results for input(s): GRAMSTAIN, LABORGA in the last 8760 hours.  Previous wound culture revealed mixed skin flora  Assessment and Plan:  Problem List Items Addressed  This Visit    None    Visit Diagnoses    Heel ulcer, left, limited to breakdown of skin ([place])    -  Primary   Healed   Pre-ulcerative calluses       Prominent metatarsal head, unspecified laterality       Uncontrolled type 2 diabetes with neuropathy ([place])         -Examined patient and discussed the progression of the healed wound on left heel and pre-ulcerative calluses -Applied protective Band-Aid to left heel and advised patient and daughter to continue to do the same when in shoes -Continue with good supportive shoes and heel cushions -Complementary trimmed pre-ulcerative callus lesions bilateral at nonprocedural charge using a sterile chisel blade without incident - Advised patient to go to the ER or return to office if the wound worsens or if constitutional symptoms are present. -Patient to return to office in 4 weeks for pre-ulcerative callus/final wound check.  Landis Martins, DPM

## 2017-07-23 ENCOUNTER — Other Ambulatory Visit: Payer: Self-pay | Admitting: Sports Medicine

## 2017-07-27 NOTE — Telephone Encounter (Signed)
Dr.Stover approved and ordered the Diflucan.

## 2017-08-13 DIAGNOSIS — Z79811 Long term (current) use of aromatase inhibitors: Secondary | ICD-10-CM | POA: Diagnosis not present

## 2017-08-13 DIAGNOSIS — C50919 Malignant neoplasm of unspecified site of unspecified female breast: Secondary | ICD-10-CM | POA: Diagnosis not present

## 2017-08-13 DIAGNOSIS — Z853 Personal history of malignant neoplasm of breast: Secondary | ICD-10-CM | POA: Diagnosis not present

## 2017-08-13 DIAGNOSIS — Z170 Estrogen receptor positive status [ER+]: Secondary | ICD-10-CM | POA: Diagnosis not present

## 2017-08-19 ENCOUNTER — Ambulatory Visit (INDEPENDENT_AMBULATORY_CARE_PROVIDER_SITE_OTHER): Payer: Medicare Other | Admitting: Sports Medicine

## 2017-08-19 ENCOUNTER — Encounter: Payer: Self-pay | Admitting: Sports Medicine

## 2017-08-19 DIAGNOSIS — E1140 Type 2 diabetes mellitus with diabetic neuropathy, unspecified: Secondary | ICD-10-CM | POA: Diagnosis not present

## 2017-08-19 DIAGNOSIS — IMO0002 Reserved for concepts with insufficient information to code with codable children: Secondary | ICD-10-CM

## 2017-08-19 DIAGNOSIS — M216X9 Other acquired deformities of unspecified foot: Secondary | ICD-10-CM

## 2017-08-19 DIAGNOSIS — L84 Corns and callosities: Secondary | ICD-10-CM

## 2017-08-19 DIAGNOSIS — M79672 Pain in left foot: Secondary | ICD-10-CM

## 2017-08-19 DIAGNOSIS — E1165 Type 2 diabetes mellitus with hyperglycemia: Secondary | ICD-10-CM

## 2017-08-19 MED ORDER — LIDOCAINE-PRILOCAINE 2.5-2.5 % EX CREA
1.0000 | TOPICAL_CREAM | CUTANEOUS | 1 refills | Status: DC | PRN
Start: 2017-08-19 — End: 2018-02-18

## 2017-08-19 NOTE — Progress Notes (Signed)
Subjective: Cindy Hahn is a 83 y.o. female patient seen in office for follow up evaluation of a healed ulceration of her left heel and pre-ulcerative calluses.  Patient is assisted by daughter who reports that she did notice some blood developing underneath the callused areas and that her mom has been complaining of some soreness to the left foot with a past blister that developed on the top and the side of her left foot by the fifth toe.  Patient overall states that she feels fine denies any nausea vomiting fever or chills.  No other issues noted.  Patient Active Problem List   Diagnosis Date Noted  . Diabetic polyneuropathy associated with type 2 diabetes mellitus ([place]) 06/04/2015  . Skin ulcer of left heel, limited to breakdown of skin ([place]) 06/04/2015  . Pacemaker 03/11/2015  . Bradycardia 02/06/2015  . Chronic atrial fibrillation ([place]) 11/01/2014  . Essential hypertension 11/01/2014  . History of stroke 11/01/2014   Current Outpatient Medications on File Prior to Visit  Medication Sig Dispense Refill  . ALPRAZolam (XANAX) 1 MG tablet Take 1 mg by mouth.    Marland Kitchen amoxicillin-clavulanate (AUGMENTIN) 875-125 MG tablet Take 1 tablet by mouth 2 (two) times daily. 28 tablet 0  . anastrozole (ARIMIDEX) 1 MG tablet take 1 tablet by mouth once daily to [place].  0  . aspirin EC 81 MG tablet Take 81 mg by mouth.    . digoxin (LANOXIN) 0.125 MG tablet Take 0.0625 mg by mouth daily.   0  . diltiazem (CARDIZEM) 60 MG tablet     . fluconazole (DIFLUCAN) 150 MG tablet TAKE 1 TABLET BY MOUTH AS A SINGLE DOSE 1 tablet 0  . LEVEMIR 100 UNIT/ML injection     . metoprolol tartrate (LOPRESSOR) 50 MG tablet take 1 and 1/2 tablet by mouth twice a day  0  . NOVOLOG FLEXPEN 100 UNIT/ML FlexPen inject 10 units SUBCUTANEOUSLY WITH MAIN MEAL  0  . potassium chloride SA (K-DUR,KLOR-CON) 20 MEQ tablet Take 20 mEq by mouth daily.   0  . rosuvastatin (CRESTOR) 20 MG tablet   0  .  sertraline (ZOLOFT) 100 MG tablet     . sodium hypochlorite (DAKIN'S 1/2 STRENGTH) external solution Use to left heel ulceration until improved in appreance 473 mL 0  . Wound Dressings (MEDIHONEY WOUND/BURN DRESSING) GEL Apply a small amount to heel ulceration 3x per week 44 mL 1   No current facility-administered medications on file prior to visit.    Allergies  Allergen Reactions  . Levofloxacin Other (See Comments)    confusion    Recent Results (from the past 2160 hour(s))  WOUND CULTURE     Status: None   Collection Time: 07/08/17  4:19 PM  Result Value Ref Range   Gram Stain Result Final report    Organism ID, Bacteria Comment     Comment: No white blood cells seen.   Organism ID, Bacteria Comment     Comment: Rare gram negative diplococci   Aerobic Bacterial Culture Final report    Organism ID, Bacteria Mixed skin flora     Comment: Heavy growth    Objective: There were no vitals filed for this visit.  General: Patient is awake, alert, oriented x 3 and in no acute distress.  Dermatology: Skin is warm and dry bilateral with a continued healed ulceration plantar aspect of left heel there is mild reactive callus tissue with underlying heme, no fluctuance, no drainage, no warmth. No other  acute signs of infection.  Pre-ulcerative callus sub met 1 bilateral and sub met 5 bilateral.  There is mild bleeding at the left submet 1 and 5 callus with no acute signs of infection.   Nails short, thick, and mildly discolored.   Vascular: Dorsalis Pedis pulse = 1/4 Bilateral,  Posterior Tibial pulse = 1/4 Bilateral,  Capillary Fill Time < 5 seconds  Neurologic: Protective sensation absent to the level of ankles using the 5.07/10g [employer].  Musculosketal: Mild pain with palpation to pre-ulcerative lesions especially on left foot and left heel area of previous ulceration. No pain with compression to calves bilateral. + prominent metatarsals with fat pad atrophy  deformities noted bilateral.  No results for input(s): GRAMSTAIN, LABORGA in the last 8760 hours.   Assessment and Plan:  Problem List Items Addressed This Visit    None    Visit Diagnoses    Left foot pain    -  Primary   Relevant Medications   lidocaine-prilocaine (EMLA) cream   Pre-ulcerative calluses       Prominent metatarsal head, unspecified laterality       Uncontrolled type 2 diabetes with neuropathy ([place])         -Examined patient and discussed the progression of the healed wound on left heel and pre-ulcerative calluses -Mechanically debrided keratotic areas of the callus using sterile to the blade applied topical lidocaine and protective Band-Aid to left heel and advised patient and daughter to continue to do the same when in shoes -Prescribed topical lidocaine -Continue with good supportive shoes and heel cushions; [employer] diabetic shoe order form was completed; office to contact primary care for approval / certification;  Office to arrange shoe fitting and dispensing. - Advised patient to go to the ER or return to office if the wound recurs or if constitutional symptoms are present. -Patient to return to office in 3-4 weeks for pre-ulcerative callus/final wound check.  Cindy Hahn, DPM

## 2017-08-23 ENCOUNTER — Telehealth: Payer: Self-pay | Admitting: *Deleted

## 2017-08-23 NOTE — Telephone Encounter (Signed)
I informed pt of  Dr. Leeanne Rio statement.

## 2017-08-23 NOTE — Telephone Encounter (Signed)
Pt's dtr, Cecille Rubin states she takes [place] and can not remember the conversation this morning. I reiterated Dr. Leeanne Rio EMLA cream statement. Cecille Rubin asked if I had called about the inserts and I told her know but I could transfer her to the offic of the Diabetic Shoe and orthotic coordinator to discuss. Transferred to [facility].

## 2017-08-23 NOTE — Telephone Encounter (Signed)
-----   Message from Landis Martins, [place] sent at 08/20/2017  2:34 PM EDT ----- Regarding: PA for EMLA Cream Will you let patient's daughter know that her insurance did not cover the EMLA (lidocaine-proliocaine cream). I spoke with the pharmacy who will put the medication on hold just in case they want it and want to pay out of pocket for it. The other option is getting OTC aspercreme to use to the painful areas but not on any area that is open or has bleeding -Dr. Cannon Kettle

## 2017-08-25 DIAGNOSIS — Z0000 Encounter for general adult medical examination without abnormal findings: Secondary | ICD-10-CM | POA: Diagnosis not present

## 2017-08-25 DIAGNOSIS — H6120 Impacted cerumen, unspecified ear: Secondary | ICD-10-CM | POA: Diagnosis not present

## 2017-08-25 DIAGNOSIS — E0865 Diabetes mellitus due to underlying condition with hyperglycemia: Secondary | ICD-10-CM | POA: Diagnosis not present

## 2017-08-25 DIAGNOSIS — N183 Chronic kidney disease, stage 3 (moderate): Secondary | ICD-10-CM | POA: Diagnosis not present

## 2017-08-25 DIAGNOSIS — Z794 Long term (current) use of insulin: Secondary | ICD-10-CM | POA: Diagnosis not present

## 2017-08-25 DIAGNOSIS — E0822 Diabetes mellitus due to underlying condition with diabetic chronic kidney disease: Secondary | ICD-10-CM | POA: Diagnosis not present

## 2017-08-25 DIAGNOSIS — Z79899 Other long term (current) drug therapy: Secondary | ICD-10-CM | POA: Diagnosis not present

## 2017-09-02 ENCOUNTER — Ambulatory Visit: Payer: Medicare Other | Admitting: *Deleted

## 2017-09-02 DIAGNOSIS — IMO0002 Reserved for concepts with insufficient information to code with codable children: Secondary | ICD-10-CM

## 2017-09-02 DIAGNOSIS — E1140 Type 2 diabetes mellitus with diabetic neuropathy, unspecified: Secondary | ICD-10-CM

## 2017-09-02 DIAGNOSIS — E1165 Type 2 diabetes mellitus with hyperglycemia: Principal | ICD-10-CM

## 2017-09-02 DIAGNOSIS — L84 Corns and callosities: Secondary | ICD-10-CM

## 2017-09-02 DIAGNOSIS — M216X9 Other acquired deformities of unspecified foot: Secondary | ICD-10-CM

## 2017-09-08 ENCOUNTER — Encounter: Payer: Self-pay | Admitting: Sports Medicine

## 2017-09-08 ENCOUNTER — Ambulatory Visit (INDEPENDENT_AMBULATORY_CARE_PROVIDER_SITE_OTHER): Payer: Medicare Other | Admitting: Sports Medicine

## 2017-09-08 DIAGNOSIS — L97521 Non-pressure chronic ulcer of other part of left foot limited to breakdown of skin: Secondary | ICD-10-CM

## 2017-09-08 DIAGNOSIS — E1140 Type 2 diabetes mellitus with diabetic neuropathy, unspecified: Secondary | ICD-10-CM | POA: Diagnosis not present

## 2017-09-08 DIAGNOSIS — M216X9 Other acquired deformities of unspecified foot: Secondary | ICD-10-CM

## 2017-09-08 DIAGNOSIS — E1165 Type 2 diabetes mellitus with hyperglycemia: Secondary | ICD-10-CM

## 2017-09-08 DIAGNOSIS — M79672 Pain in left foot: Secondary | ICD-10-CM

## 2017-09-08 DIAGNOSIS — L84 Corns and callosities: Secondary | ICD-10-CM

## 2017-09-08 DIAGNOSIS — IMO0002 Reserved for concepts with insufficient information to code with codable children: Secondary | ICD-10-CM

## 2017-09-08 NOTE — Progress Notes (Signed)
Subjective: Cindy Hahn is a 83 y.o. female patient seen in office for follow up evaluation of pre-ulcerative calluses.  Patient is assisted by daughter who reports that they went to graduation since last visit and has experienced more darkening bleeding at the left foot calluses and increased pain along the fifth metatarsal head and to the heel that required her to take tramadol once which helped.  Patient reports that she has found one pair of shoes that is comfortable.  Patient overall states that she feels fine denies any nausea vomiting fever or chills however that she admits that she is worried that her foot has not healed and keeps getting worse and states that she thinks she will be better if her foot was cut off.  No other issues noted.  Patient Active Problem List   Diagnosis Date Noted  . Diabetic polyneuropathy associated with type 2 diabetes mellitus ([place]) 06/04/2015  . Skin ulcer of left heel, limited to breakdown of skin ([place]) 06/04/2015  . Pacemaker 03/11/2015  . Bradycardia 02/06/2015  . Chronic atrial fibrillation ([place]) 11/01/2014  . Essential hypertension 11/01/2014  . History of stroke 11/01/2014   Current Outpatient Medications on File Prior to Visit  Medication Sig Dispense Refill  . ALPRAZolam (XANAX) 1 MG tablet Take 1 mg by mouth.    Marland Kitchen amoxicillin-clavulanate (AUGMENTIN) 875-125 MG tablet Take 1 tablet by mouth 2 (two) times daily. 28 tablet 0  . anastrozole (ARIMIDEX) 1 MG tablet take 1 tablet by mouth once daily to [place].  0  . aspirin EC 81 MG tablet Take 81 mg by mouth.    . digoxin (LANOXIN) 0.125 MG tablet Take 0.0625 mg by mouth daily.   0  . diltiazem (CARDIZEM) 60 MG tablet     . fluconazole (DIFLUCAN) 150 MG tablet TAKE 1 TABLET BY MOUTH AS A SINGLE DOSE 1 tablet 0  . LEVEMIR 100 UNIT/ML injection     . lidocaine-prilocaine (EMLA) cream Apply 1 application topically as needed. For pain to left foot 30 g 1  . metoprolol  tartrate (LOPRESSOR) 50 MG tablet take 1 and 1/2 tablet by mouth twice a day  0  . NOVOLOG FLEXPEN 100 UNIT/ML FlexPen inject 10 units SUBCUTANEOUSLY WITH MAIN MEAL  0  . potassium chloride SA (K-DUR,KLOR-CON) 20 MEQ tablet Take 20 mEq by mouth daily.   0  . rosuvastatin (CRESTOR) 20 MG tablet   0  . sertraline (ZOLOFT) 100 MG tablet     . sodium hypochlorite (DAKIN'S 1/2 STRENGTH) external solution Use to left heel ulceration until improved in appreance 473 mL 0  . Wound Dressings (MEDIHONEY WOUND/BURN DRESSING) GEL Apply a small amount to heel ulceration 3x per week 44 mL 1   No current facility-administered medications on file prior to visit.    Allergies  Allergen Reactions  . Levofloxacin Other (See Comments)    confusion    Recent Results (from the past 2160 hour(s))  WOUND CULTURE     Status: None   Collection Time: 07/08/17  4:19 PM  Result Value Ref Range   Gram Stain Result Final report    Organism ID, Bacteria Comment     Comment: No white blood cells seen.   Organism ID, Bacteria Comment     Comment: Rare gram negative diplococci   Aerobic Bacterial Culture Final report    Organism ID, Bacteria Mixed skin flora     Comment: Heavy growth    Objective: There were no vitals  filed for this visit.  General: Patient is awake, alert, oriented x 3 and in no acute distress.  Dermatology: Skin is warm and dry bilateral with a ulceration with a pinpoint opening at the plantar aspect of the fifth metatarsal head with a granular base, there is mild reactive callus tissue no fluctuance, no drainage, no warmth. No other acute signs of infection.  Pre-ulcerative callus sub met 1 bilateral and sub met 5 on right and heel on the left.  There is mild bleeding at the left foot callus areas no acute signs of infection.   Nails short, thick, and mildly discolored.   Vascular: Dorsalis Pedis pulse = 1/4 Bilateral,  Posterior Tibial pulse = 1/4 Bilateral,  Capillary Fill Time < 5  seconds  Neurologic: Protective sensation absent to the level of ankles using the 5.07/10g [employer].  Musculosketal: Mild pain with palpation to pre-ulcerative lesions especially on left foot and left heel area of previous ulceration. No pain with compression to calves bilateral. + prominent metatarsals with fat pad atrophy deformities noted bilateral.  No results for input(s): GRAMSTAIN, LABORGA in the last 8760 hours.   Assessment and Plan:  Problem List Items Addressed This Visit    None    Visit Diagnoses    Foot ulcer, limited to breakdown of skin, left ([facility])    -  Primary   Pre-ulcerative calluses       Prominent metatarsal head, unspecified laterality       Uncontrolled type 2 diabetes with neuropathy ([facility])       Left foot pain         -Examined patient and discussed the progression of the healed wound on left heel and pre-ulcerative calluses -Mechanically debrided keratotic areas of the callus and wound left sub-met 5 using sterile to the blade applied antibiotic cream and offloading padding covered with protective Band-Aid and advised patient and daughter to use Dakin solutions every other day to these wounds to clean them and dressed with gauze paper tape and offloading padding or offloading padding and Band-Aid  -May take tramadol as needed for severe foot pain -Awaiting diabetic shoes with custom offloading insoles - Advised patient to go to the ER or return to office if the wound recurs or if constitutional symptoms are present. -Patient to return to office in 3-4 weeks for wound check and for dispensing of shoes and insoles.   Landis Martins, DPM

## 2017-09-23 ENCOUNTER — Encounter: Payer: Self-pay | Admitting: Sports Medicine

## 2017-09-23 ENCOUNTER — Ambulatory Visit (INDEPENDENT_AMBULATORY_CARE_PROVIDER_SITE_OTHER): Payer: Medicare Other | Admitting: Sports Medicine

## 2017-09-23 VITALS — BP 111/59 | HR 90 | Temp 98.7°F | Resp 16 | Ht 66.0 in | Wt 133.0 lb

## 2017-09-23 DIAGNOSIS — L97421 Non-pressure chronic ulcer of left heel and midfoot limited to breakdown of skin: Secondary | ICD-10-CM | POA: Diagnosis not present

## 2017-09-23 DIAGNOSIS — L02612 Cutaneous abscess of left foot: Secondary | ICD-10-CM | POA: Diagnosis not present

## 2017-09-23 DIAGNOSIS — IMO0002 Reserved for concepts with insufficient information to code with codable children: Secondary | ICD-10-CM

## 2017-09-23 DIAGNOSIS — M216X9 Other acquired deformities of unspecified foot: Secondary | ICD-10-CM

## 2017-09-23 DIAGNOSIS — E1140 Type 2 diabetes mellitus with diabetic neuropathy, unspecified: Secondary | ICD-10-CM

## 2017-09-23 DIAGNOSIS — L97521 Non-pressure chronic ulcer of other part of left foot limited to breakdown of skin: Secondary | ICD-10-CM | POA: Diagnosis not present

## 2017-09-23 DIAGNOSIS — M79672 Pain in left foot: Secondary | ICD-10-CM

## 2017-09-23 DIAGNOSIS — L97422 Non-pressure chronic ulcer of left heel and midfoot with fat layer exposed: Secondary | ICD-10-CM | POA: Diagnosis not present

## 2017-09-23 DIAGNOSIS — E1165 Type 2 diabetes mellitus with hyperglycemia: Secondary | ICD-10-CM

## 2017-09-23 NOTE — Progress Notes (Signed)
Subjective: Cindy Hahn is a 83 y.o. female patient seen in office for follow up evaluation of left foot wounds.  Patient is assisted by daughter who reports that patient has been dressing the wounds majority of the time herself and on last night they tried using the Dakin's solution with a foot bath which seemed to help make it easier for her and then redressed the areas with gauze dressing.  Does admit to increased drainage that is bloody in nature however does deny any constitutional symptoms like nausea, vomiting, fever, chills however patient states that she is feeling depressed because she does not go anywhere and that she has these wounds.  No other issues noted.  Patient Active Problem List   Diagnosis Date Noted  . Diabetic polyneuropathy associated with type 2 diabetes mellitus ([place]) 06/04/2015  . Skin ulcer of left heel, limited to breakdown of skin ([place]) 06/04/2015  . Pacemaker 03/11/2015  . Bradycardia 02/06/2015  . Chronic atrial fibrillation ([place]) 11/01/2014  . Essential hypertension 11/01/2014  . History of stroke 11/01/2014   Current Outpatient Medications on File Prior to Visit  Medication Sig Dispense Refill  . ALPRAZolam (XANAX) 1 MG tablet Take 1 mg by mouth.    Marland Kitchen amoxicillin-clavulanate (AUGMENTIN) 875-125 MG tablet Take 1 tablet by mouth 2 (two) times daily. 28 tablet 0  . anastrozole (ARIMIDEX) 1 MG tablet take 1 tablet by mouth once daily to [place].  0  . aspirin EC 81 MG tablet Take 81 mg by mouth.    . digoxin (LANOXIN) 0.125 MG tablet Take 0.0625 mg by mouth daily.   0  . diltiazem (CARDIZEM) 60 MG tablet     . fluconazole (DIFLUCAN) 150 MG tablet TAKE 1 TABLET BY MOUTH AS A SINGLE DOSE 1 tablet 0  . LEVEMIR 100 UNIT/ML injection     . lidocaine-prilocaine (EMLA) cream Apply 1 application topically as needed. For pain to left foot 30 g 1  . metoprolol tartrate (LOPRESSOR) 50 MG tablet take 1 and 1/2 tablet by mouth twice a day  0   . NOVOLOG FLEXPEN 100 UNIT/ML FlexPen inject 10 units SUBCUTANEOUSLY WITH MAIN MEAL  0  . potassium chloride SA (K-DUR,KLOR-CON) 20 MEQ tablet Take 20 mEq by mouth daily.   0  . rosuvastatin (CRESTOR) 20 MG tablet   0  . sertraline (ZOLOFT) 100 MG tablet     . sodium hypochlorite (DAKIN'S 1/2 STRENGTH) external solution Use to left heel ulceration until improved in appreance 473 mL 0  . Wound Dressings (MEDIHONEY WOUND/BURN DRESSING) GEL Apply a small amount to heel ulceration 3x per week 44 mL 1   No current facility-administered medications on file prior to visit.    Allergies  Allergen Reactions  . Levofloxacin Other (See Comments)    confusion    Recent Results (from the past 2160 hour(s))  WOUND CULTURE     Status: None   Collection Time: 07/08/17  4:19 PM  Result Value Ref Range   Gram Stain Result Final report    Organism ID, Bacteria Comment     Comment: No white blood cells seen.   Organism ID, Bacteria Comment     Comment: Rare gram negative diplococci   Aerobic Bacterial Culture Final report    Organism ID, Bacteria Mixed skin flora     Comment: Heavy growth    Objective: Vitals:   09/23/17 1634  Weight: 133 lb (60.3 kg)  Height: '5\' 6"'  (1.676 m)  General: Patient is awake, alert, oriented x 3 and in no acute distress.  Dermatology: Skin is warm and dry bilateral with a ulceration with a pinpoint opening at the plantar aspect of the fifth metatarsal head, first metatarsal head, and heel with a granular base, there is mild reactive callus tissue no fluctuance, mild bloody  drainage, no warmth. No other acute signs of infection.  Pre-ulcerative callus sub met 1 bilateral and sub met 5 on right.  Nails short, thick, and mildly discolored.   Vascular: Dorsalis Pedis pulse = 1/4 Bilateral,  Posterior Tibial pulse = 1/4 Bilateral,  Capillary Fill Time < 5 seconds  Neurologic: Protective sensation absent to the level of ankles using the 5.07/10g [employer].  Musculosketal: Mild pain with palpation to ulcerated areas on left. No pain with compression to calves bilateral. + prominent metatarsals with fat pad atrophy deformities noted bilateral.  No results for input(s): GRAMSTAIN, LABORGA in the last 8760 hours.   Assessment and Plan:  Problem List Items Addressed This Visit    None    Visit Diagnoses    Foot ulcer, limited to breakdown of skin, left ([place])    -  Primary   Relevant Orders   WOUND CULTURE   Heel ulcer, left, limited to breakdown of skin ([place])       Relevant Orders   WOUND CULTURE   Abscess of left foot       Relevant Orders   WOUND CULTURE   Prominent metatarsal head, unspecified laterality       Uncontrolled type 2 diabetes with neuropathy ([place])       Left foot pain         -Examined patient and discussed the progression of the wounds on the left -Mechanically debrided wounds to healthy bleeding base using a sterile chisel blade hemostasis was achieved using manual pressure all nonviable tissue was removed including excessive keratosis to these areas on the left foot then applied Betadine offloading pad and dry dressing secured with paper tape.  Prior to dressing the wounds a wound culture was obtained to surveillance the wound since these wounds keep re-ulcerating to determine if we can order a topical wound powder or soak from [facility]. -Advised patient and daughter to use Verdene Lennert solutions every other day to these wounds to clean them and dressed with gauze paper tape and offloading padding -May take tramadol as needed for severe foot pain -Awaiting diabetic shoes with custom offloading insoles - Advised patient to go to the ER or return to office if the wound recurs or if constitutional symptoms are present. -Patient to return to office in 3 weeks for wound check and for dispensing of shoes and insoles.   Landis Martins, DPM

## 2017-09-27 LAB — WOUND CULTURE

## 2017-09-28 ENCOUNTER — Telehealth: Payer: Self-pay | Admitting: *Deleted

## 2017-09-28 MED ORDER — CIPROFLOXACIN HCL 500 MG PO TABS: 500.0000 mg | ORAL_TABLET | Freq: Two times a day (BID) | ORAL | 0 refills | Status: DC

## 2017-09-28 NOTE — Telephone Encounter (Signed)
-----   Message from Landis Martins, [place] sent at 09/27/2017  9:36 AM EDT ----- Culture + for Psuedomonas Please send to pharmacy if patient can tolerate  Ciprofloxacin 400mg  bid x 14 days. Patient will also have a compound made for her from [facility] I will have melody fax the Rx from [facility] office on Wednesday  -Dr. Cannon Kettle

## 2017-09-28 NOTE — Telephone Encounter (Signed)
I informed pt's dtr, Cindy Hahn of Dr. Leeanne Rio review of results and orders. I spoke with Dr. Cannon Kettle and she states she would like the ciprofloxacin changed to 500mg  bid for 14 days. Orders to [facility].

## 2017-10-13 ENCOUNTER — Ambulatory Visit: Payer: Medicare Other | Admitting: Sports Medicine

## 2017-10-13 DIAGNOSIS — M85852 Other specified disorders of bone density and structure, left thigh: Secondary | ICD-10-CM | POA: Diagnosis not present

## 2017-10-13 DIAGNOSIS — M810 Age-related osteoporosis without current pathological fracture: Secondary | ICD-10-CM | POA: Diagnosis not present

## 2017-10-13 DIAGNOSIS — R922 Inconclusive mammogram: Secondary | ICD-10-CM | POA: Diagnosis not present

## 2017-10-13 DIAGNOSIS — C50411 Malignant neoplasm of upper-outer quadrant of right female breast: Secondary | ICD-10-CM | POA: Diagnosis not present

## 2017-10-13 DIAGNOSIS — M8589 Other specified disorders of bone density and structure, multiple sites: Secondary | ICD-10-CM | POA: Diagnosis not present

## 2017-10-15 ENCOUNTER — Encounter: Payer: Self-pay | Admitting: Sports Medicine

## 2017-10-15 ENCOUNTER — Ambulatory Visit (INDEPENDENT_AMBULATORY_CARE_PROVIDER_SITE_OTHER): Payer: Medicare Other | Admitting: Sports Medicine

## 2017-10-15 DIAGNOSIS — E1140 Type 2 diabetes mellitus with diabetic neuropathy, unspecified: Secondary | ICD-10-CM

## 2017-10-15 DIAGNOSIS — M216X9 Other acquired deformities of unspecified foot: Secondary | ICD-10-CM

## 2017-10-15 DIAGNOSIS — L97421 Non-pressure chronic ulcer of left heel and midfoot limited to breakdown of skin: Secondary | ICD-10-CM | POA: Diagnosis not present

## 2017-10-15 DIAGNOSIS — E1165 Type 2 diabetes mellitus with hyperglycemia: Secondary | ICD-10-CM

## 2017-10-15 DIAGNOSIS — L97521 Non-pressure chronic ulcer of other part of left foot limited to breakdown of skin: Secondary | ICD-10-CM

## 2017-10-15 DIAGNOSIS — M79672 Pain in left foot: Secondary | ICD-10-CM

## 2017-10-15 DIAGNOSIS — IMO0002 Reserved for concepts with insufficient information to code with codable children: Secondary | ICD-10-CM

## 2017-10-15 NOTE — Progress Notes (Addendum)
Subjective: Cindy Hahn is a 83 y.o. female patient seen in office for follow up evaluation of left foot wounds.  Patient is assisted by daughter who reports that patient has been compliant with doing her foot baths.  Does state that she feels like the areas are healing but there is still a little pain on the inside of her foot that feels like it is pain along the bones.  Admits bloody drainage and focal redness and swelling.  Denies fever, chills night sweats vomiting or any other constitutional symptoms at this time.  No other issues noted.  Patient Active Problem List   Diagnosis Date Noted  . Diabetic polyneuropathy associated with type 2 diabetes mellitus ([place]) 06/04/2015  . Skin ulcer of left heel, limited to breakdown of skin ([place]) 06/04/2015  . Pacemaker 03/11/2015  . Bradycardia 02/06/2015  . Chronic atrial fibrillation ([place]) 11/01/2014  . Essential hypertension 11/01/2014  . History of stroke 11/01/2014   Current Outpatient Medications on File Prior to Visit  Medication Sig Dispense Refill  . ALPRAZolam (XANAX) 1 MG tablet Take 1 mg by mouth.    Marland Kitchen amoxicillin-clavulanate (AUGMENTIN) 875-125 MG tablet Take 1 tablet by mouth 2 (two) times daily. 28 tablet 0  . anastrozole (ARIMIDEX) 1 MG tablet take 1 tablet by mouth once daily to [place].  0  . aspirin EC 81 MG tablet Take 81 mg by mouth.    . ciprofloxacin (CIPRO) 500 MG tablet Take 1 tablet (500 mg total) by mouth 2 (two) times daily. 28 tablet 0  . digoxin (LANOXIN) 0.125 MG tablet Take 0.0625 mg by mouth daily.   0  . diltiazem (CARDIZEM) 60 MG tablet     . fluconazole (DIFLUCAN) 150 MG tablet TAKE 1 TABLET BY MOUTH AS A SINGLE DOSE 1 tablet 0  . LEVEMIR 100 UNIT/ML injection     . lidocaine-prilocaine (EMLA) cream Apply 1 application topically as needed. For pain to left foot 30 g 1  . metoprolol tartrate (LOPRESSOR) 50 MG tablet take 1 and 1/2 tablet by mouth twice a day  0  . NOVOLOG FLEXPEN  100 UNIT/ML FlexPen inject 10 units SUBCUTANEOUSLY WITH MAIN MEAL  0  . potassium chloride SA (K-DUR,KLOR-CON) 20 MEQ tablet Take 20 mEq by mouth daily.   0  . rosuvastatin (CRESTOR) 20 MG tablet   0  . sertraline (ZOLOFT) 100 MG tablet     . sodium hypochlorite (DAKIN'S 1/2 STRENGTH) external solution Use to left heel ulceration until improved in appreance 473 mL 0  . Wound Dressings (MEDIHONEY WOUND/BURN DRESSING) GEL Apply a small amount to heel ulceration 3x per week 44 mL 1   No current facility-administered medications on file prior to visit.    Allergies  Allergen Reactions  . Levofloxacin Other (See Comments)    confusion    Recent Results (from the past 2160 hour(s))  WOUND CULTURE     Status: Abnormal   Collection Time: 09/23/17  5:23 PM  Result Value Ref Range   Gram Stain Result Final report    Organism ID, Bacteria Comment     Comment: No white blood cells seen.   Organism ID, Bacteria Comment     Comment: Many gram negative rods.   Organism ID, Bacteria Comment     Comment: Few gram positive cocci   Aerobic Bacterial Culture Final report (A)    Organism ID, Bacteria Comment (A)     Comment: Pseudomonas aeruginosa Heavy growth  Organism ID, Bacteria Routine flora     Comment: Heavy growth   Antimicrobial Susceptibility Comment     Comment:       ** S = Susceptible; I = Intermediate; R = Resistant **                    P = Positive; N = Negative             MICS are expressed in micrograms per mL    Antibiotic                 RSLT#1    RSLT#2    RSLT#3    RSLT#4 Amikacin                       S Cefepime                       S Ceftazidime                    S Ciprofloxacin                  S Gentamicin                     S Imipenem                       S Levofloxacin                   S Meropenem                      S Piperacillin                   S Ticarcillin                    S Tobramycin                     S     Objective: There were no  vitals filed for this visit.  General: Patient is awake, alert, oriented x 3 and in no acute distress.  Dermatology: Skin is warm and dry bilateral with a ulceration with a pinpoint opening at the plantar aspect of the fifth metatarsal head, first metatarsal head, and heel with a granular base, there is mild reactive callus tissue no fluctuance, mild bloody  drainage that has improved, no warmth. No other acute signs of infection.  Pre-ulcerative callus sub met 1 bilateral and sub met 5 on right.  Nails short, thick, and mildly discolored.   Vascular: Dorsalis Pedis pulse = 1/4 Bilateral,  Posterior Tibial pulse = 1/4 Bilateral,  Capillary Fill Time < 5 seconds  Neurologic: Protective sensation absent to the level of ankles using the 5.07/10g [employer].  Musculosketal: Mild pain with palpation to ulcerated areas on left. No pain with compression to calves bilateral. + prominent metatarsals with fat pad atrophy deformities noted bilateral.  No results for input(s): GRAMSTAIN, LABORGA in the last 8760 hours.   Assessment and Plan:  Problem List Items Addressed This Visit    None    Visit Diagnoses    Foot ulcer, limited to breakdown of skin, left ([place])    -  Primary   Heel ulcer, left, limited to breakdown of skin ([place])       Prominent metatarsal head, unspecified laterality  Uncontrolled type 2 diabetes with neuropathy ([facility])       Left foot pain         -Examined patient and discussed the progression of the wounds on the left -Mechanically debrided wounds to healthy bleeding base using a sterile chisel blade hemostasis was achieved using manual pressure all nonviable tissue was removed including excessive keratosis to these areas on the left foot then applied dry gauze dressing and offloading pad secured with paper tape.  -Continue with custom compound powder and foot bath soaks -Continue with oral antibiotic until completed -Dispensed diabetic shoes to  patient with wear and break in pattern explained patient may start slowly wearing the shoes today - Advised patient to go to the ER or return to office if the wound recurs or if constitutional symptoms are present. -Patient to return to office in 3 weeks for wound and shoe check.   Landis Martins, DPM

## 2017-11-11 ENCOUNTER — Encounter: Payer: Self-pay | Admitting: Sports Medicine

## 2017-11-11 ENCOUNTER — Ambulatory Visit (INDEPENDENT_AMBULATORY_CARE_PROVIDER_SITE_OTHER): Payer: Medicare Other | Admitting: Sports Medicine

## 2017-11-11 VITALS — BP 86/47 | HR 87 | Temp 98.1°F | Resp 16

## 2017-11-11 DIAGNOSIS — L97421 Non-pressure chronic ulcer of left heel and midfoot limited to breakdown of skin: Secondary | ICD-10-CM

## 2017-11-11 DIAGNOSIS — E1142 Type 2 diabetes mellitus with diabetic polyneuropathy: Secondary | ICD-10-CM

## 2017-11-11 DIAGNOSIS — F0150 Vascular dementia without behavioral disturbance: Secondary | ICD-10-CM

## 2017-11-11 DIAGNOSIS — M779 Enthesopathy, unspecified: Secondary | ICD-10-CM | POA: Diagnosis not present

## 2017-11-11 DIAGNOSIS — M216X9 Other acquired deformities of unspecified foot: Secondary | ICD-10-CM

## 2017-11-11 DIAGNOSIS — L97521 Non-pressure chronic ulcer of other part of left foot limited to breakdown of skin: Secondary | ICD-10-CM

## 2017-11-11 DIAGNOSIS — M79672 Pain in left foot: Secondary | ICD-10-CM

## 2017-11-11 MED ORDER — TRIAMCINOLONE ACETONIDE 10 MG/ML IJ SUSP: 10.0000 mg | Freq: Once | INTRAMUSCULAR | Status: DC

## 2017-11-11 NOTE — Patient Instructions (Addendum)
Pre-Operative Instructions  Congratulations, you have decided to take an important step towards improving your quality of life.  You can be assured that the doctors and staff at [facility] will be with you every step of the way.  Here are some important things you should know:  1. Plan to be at the surgery center/hospital at least 1 (one) hour prior to your scheduled time, unless otherwise directed by the surgical center/hospital staff.  You must have a responsible adult accompany you, remain during the surgery and drive you home.  Make sure you have directions to the surgical center/hospital to ensure you arrive on time. 2. If you are having surgery at [facility] or [facility], you will need a copy of your medical history and physical form from your family physician within one month prior to the date of surgery. We will give you a form for your primary physician to complete.  3. We make every effort to accommodate the date you request for surgery.  However, there are times where surgery dates or times have to be moved.  We will contact you as soon as possible if a change in schedule is required.   4. No aspirin/ibuprofen for one week before surgery.  If you are on aspirin, any non-steroidal anti-inflammatory medications (Mobic, Aleve, Ibuprofen) should not be taken seven (7) days prior to your surgery.  You make take Tylenol for pain prior to surgery.  5. Medications - If you are taking daily heart and blood pressure medications, seizure, reflux, allergy, asthma, anxiety, pain or diabetes medications, make sure you notify the surgery center/hospital before the day of surgery so they can tell you which medications you should take or avoid the day of surgery. 6. No food or drink after midnight the night before surgery unless directed otherwise by surgical center/hospital staff. 7. No alcoholic beverages 24-hours prior to surgery.  No smoking 24-hours prior or 24-hours after  surgery. 8. Wear loose pants or shorts. They should be loose enough to fit over bandages, boots, and casts. 9. Don't wear slip-on shoes. Sneakers are preferred. 10. Bring your boot with you to the surgery center/hospital.  Also bring crutches or a walker if your physician has prescribed it for you.  If you do not have this equipment, it will be provided for you after surgery. 11. If you have not been contacted by the surgery center/hospital by the day before your surgery, call to confirm the date and time of your surgery. 12. Leave-time from work may vary depending on the type of surgery you have.  Appropriate arrangements should be made prior to surgery with your employer. 13. Prescriptions will be provided immediately following surgery by your doctor.  Fill these as soon as possible after surgery and take the medication as directed. Pain medications will not be refilled on weekends and must be approved by the doctor. 14. Remove nail polish on the operative foot and avoid getting pedicures prior to surgery. 15. Wash the night before surgery.  The night before surgery wash the foot and leg well with water and the antibacterial soap provided. Be sure to pay special attention to beneath the toenails and in between the toes.  Wash for at least three (3) minutes. Rinse thoroughly with water and dry well with a towel.  Perform this wash unless told not to do so by your physician.  Enclosed: 1 Ice pack (please put in freezer the night before surgery)   1 Hibiclens skin cleaner     Pre-op instructions  If you have any questions regarding the instructions, please do not hesitate to call our office.  [place]: [address], [place], [place] [address] -- [phone]  [place]: [address] [place], [place] [address] -- [phone]  [place]: [address]  [place], [place] [address] -- [phone]  [place]: [address], [address], [place], [place] [address] -- [phone]  Website:  [email or web address] 

## 2017-11-11 NOTE — Progress Notes (Addendum)
Subjective: Cindy Hahn is a 82 y.o. female patient seen in office for follow up evaluation of left foot wounds.  Patient is assisted by daughter who reports that patient has been compliant with doing her foot baths until she ran out of medication and could not refill it because the cost of the medication was going to be $90 because the insurance would not cover a second time.  Patient reports that she is having a lot of pain very extreme and debilitating especially at the lateral side of her left foot feels like at that area of the bone and wound feels more she like something needs to be popped or removed in that area.  Patient reports that her shoes hurt her feet and can only wear certain shoes to feel comfortable.  Admits bloody drainage and focal redness and swelling.  Denies fever, chills night sweats vomiting or any other constitutional symptoms at this time.  No other issues noted.  Patient Active Problem List   Diagnosis Date Noted  . Diabetic polyneuropathy associated with type 2 diabetes mellitus ([place]) 06/04/2015  . Skin ulcer of left heel, limited to breakdown of skin ([place]) 06/04/2015  . Pacemaker 03/11/2015  . Bradycardia 02/06/2015  . Chronic atrial fibrillation ([place]) 11/01/2014  . Essential hypertension 11/01/2014  . History of stroke 11/01/2014   Current Outpatient Medications on File Prior to Visit  Medication Sig Dispense Refill  . ALPRAZolam (XANAX) 1 MG tablet Take 1 mg by mouth.    Marland Kitchen amoxicillin-clavulanate (AUGMENTIN) 875-125 MG tablet Take 1 tablet by mouth 2 (two) times daily. 28 tablet 0  . anastrozole (ARIMIDEX) 1 MG tablet take 1 tablet by mouth once daily to [place].  0  . aspirin EC 81 MG tablet Take 81 mg by mouth.    . ciprofloxacin (CIPRO) 500 MG tablet Take 1 tablet (500 mg total) by mouth 2 (two) times daily. 28 tablet 0  . digoxin (LANOXIN) 0.125 MG tablet Take 0.0625 mg by mouth daily.   0  . diltiazem (CARDIZEM) 60 MG tablet      . fluconazole (DIFLUCAN) 150 MG tablet TAKE 1 TABLET BY MOUTH AS A SINGLE DOSE 1 tablet 0  . LEVEMIR 100 UNIT/ML injection     . lidocaine-prilocaine (EMLA) cream Apply 1 application topically as needed. For pain to left foot 30 g 1  . metoprolol tartrate (LOPRESSOR) 50 MG tablet take 1 and 1/2 tablet by mouth twice a day  0  . NOVOLOG FLEXPEN 100 UNIT/ML FlexPen inject 10 units SUBCUTANEOUSLY WITH MAIN MEAL  0  . potassium chloride SA (K-DUR,KLOR-CON) 20 MEQ tablet Take 20 mEq by mouth daily.   0  . rosuvastatin (CRESTOR) 20 MG tablet   0  . sertraline (ZOLOFT) 100 MG tablet     . sodium hypochlorite (DAKIN'S 1/2 STRENGTH) external solution Use to left heel ulceration until improved in appreance 473 mL 0  . Wound Dressings (MEDIHONEY WOUND/BURN DRESSING) GEL Apply a small amount to heel ulceration 3x per week 44 mL 1   No current facility-administered medications on file prior to visit.    Allergies  Allergen Reactions  . Levofloxacin Other (See Comments)    confusion    Recent Results (from the past 2160 hour(s))  WOUND CULTURE     Status: Abnormal   Collection Time: 09/23/17  5:23 PM  Result Value Ref Range   Gram Stain Result Final report    Organism ID, Bacteria Comment  Comment: No white blood cells seen.   Organism ID, Bacteria Comment     Comment: Many gram negative rods.   Organism ID, Bacteria Comment     Comment: Few gram positive cocci   Aerobic Bacterial Culture Final report (A)    Organism ID, Bacteria Comment (A)     Comment: Pseudomonas aeruginosa Heavy growth    Organism ID, Bacteria Routine flora     Comment: Heavy growth   Antimicrobial Susceptibility Comment     Comment:       ** S = Susceptible; I = Intermediate; R = Resistant **                    P = Positive; N = Negative             MICS are expressed in micrograms per mL    Antibiotic                 RSLT#1    RSLT#2    RSLT#3    RSLT#4 Amikacin                       S Cefepime                        S Ceftazidime                    S Ciprofloxacin                  S Gentamicin                     S Imipenem                       S Levofloxacin                   S Meropenem                      S Piperacillin                   S Ticarcillin                    S Tobramycin                     S     Objective: There were no vitals filed for this visit.  General: Patient is awake, alert, oriented x 3 and in no acute distress.  Dermatology: Skin is warm and dry bilateral with a ulceration with a pinpoint opening at the plantar aspect of the fifth metatarsal head with a granular base on the left foot, there is also a ulceration at the first metatarsal head and heel on left with a granular base, the 1st met head ulceration measures 0.5x0.5cm and the heel ulceration measures 0.8x1cm, there is mild reactive callus tissue no fluctuance, mild bloody drainage, no warmth. No other acute signs of infection.  Pre-ulcerative callus sub met 1 bilateral and sub met 5 on right.  Nails short, thick, and mildly discolored.   Vascular: Dorsalis Pedis pulse = 1/4 Bilateral,  Posterior Tibial pulse = 1/4 Bilateral,  Capillary Fill Time < 5 seconds  Neurologic: Protective sensation absent to the level of ankles using the 5.07/10g [employer].  Musculosketal: Mild pain with palpation to ulcerated areas on left.  And with pain to the fifth metatarsal head with surrounding soft tissue swelling likely consistent with capsulitis on the left, no pain with compression to calves bilateral. + prominent metatarsals with fat pad atrophy deformities noted bilateral.  No results for input(s): GRAMSTAIN, LABORGA in the last 8760 hours.   Assessment and Plan:  Problem List Items Addressed This Visit      Endocrine   Diabetic polyneuropathy associated with type 2 diabetes mellitus ([place])    Other Visit Diagnoses    Capsulitis    -  Primary   Relevant Medications   triamcinolone acetonide  (KENALOG) 10 MG/ML injection 10 mg (Start on 11/11/2017  5:15 PM)   Foot ulcer, limited to breakdown of skin, left ([place])       Heel ulcer, left, limited to breakdown of skin ([place])       Prominent metatarsal head, unspecified laterality       Left foot pain       Vascular dementia without behavioral disturbance         -Examined patient and discussed the progression of the wounds on the left with new onset capsulitis at the fifth metatarsal head -After oral consent and aseptic prep, injected a mixture containing 1 ml of 2%  plain lidocaine, 1 ml 0.5% plain marcaine, 0.5 ml of kenalog 10 and 0.5 ml of dexamethasone phosphate into fifth metatarsophalangeal joint being careful to stay away from the ulcerative callus lesion without complication. Post-injection care discussed with patient.  -Mechanically debrided wounds to healthy bleeding base using a sterile chisel blade hemostasis was achieved using manual pressure all nonviable tissue was removed including excessive keratosis to these areas on the left foot then applied offloading pad, antibiotic cream and Band-Aid dressing -Further discussed with patient progression of wounds and extreme pain;Patient opt for surgical management. Consent obtained for left foot wound debridement with Osiris graft and removal of fifth metatarsal head.  Pre and Post op course explained. Risks, benefits, alternatives explained. No guarantees given or implied. Surgical booking slip submitted and provided patient with Surgical packet and info for [facility] and H&P form given -Patient has a postoperative shoe which she will bring the day of surgery -Continue with daily chlorhexidine scrub to the left foot wounds meanwhile -Continue with diabetic shoes or shoes that are the most comfortable for her feet - Advised patient to go to the ER or return to office if the wound recurs or if constitutional symptoms are present. -Patient to return to office after  surgery. Landis Martins, DPM

## 2017-11-12 ENCOUNTER — Telehealth: Payer: Self-pay | Admitting: *Deleted

## 2017-11-12 NOTE — Telephone Encounter (Signed)
"  I'm calling to schedule my mom's surgery with Dr. Cannon Kettle.  She's going to be doing it at [place] on 11/25/2017."  I'll get it scheduled.  "She's scheduled to have a physical on August 2."  Please have them fax the form to me.  My fax number is [phone].  "Okay, I will have them fax it to you or would you like me to take it by the [place] office?"  You can do which ever you prefer.  "Okay, thanks for your help."

## 2017-11-23 ENCOUNTER — Telehealth: Payer: Self-pay

## 2017-11-23 DIAGNOSIS — Z6820 Body mass index (BMI) 20.0-20.9, adult: Secondary | ICD-10-CM | POA: Diagnosis not present

## 2017-11-23 DIAGNOSIS — L97409 Non-pressure chronic ulcer of unspecified heel and midfoot with unspecified severity: Secondary | ICD-10-CM | POA: Diagnosis not present

## 2017-11-23 NOTE — Telephone Encounter (Signed)
Spoke with patient's daughter, Cecille Rubin who says that they are on their way to patient's PCP to have the history and physical form filled out.  She says she is going to drop it off at our [place] office to [place] as soon as is completed.

## 2017-11-25 ENCOUNTER — Encounter: Payer: Self-pay | Admitting: Sports Medicine

## 2017-11-25 DIAGNOSIS — E1122 Type 2 diabetes mellitus with diabetic chronic kidney disease: Secondary | ICD-10-CM | POA: Diagnosis not present

## 2017-11-25 DIAGNOSIS — E11621 Type 2 diabetes mellitus with foot ulcer: Secondary | ICD-10-CM

## 2017-11-25 DIAGNOSIS — M21542 Acquired clubfoot, left foot: Secondary | ICD-10-CM

## 2017-11-25 DIAGNOSIS — Z7982 Long term (current) use of aspirin: Secondary | ICD-10-CM | POA: Diagnosis not present

## 2017-11-25 DIAGNOSIS — M898X7 Other specified disorders of bone, ankle and foot: Secondary | ICD-10-CM | POA: Diagnosis not present

## 2017-11-25 DIAGNOSIS — Z794 Long term (current) use of insulin: Secondary | ICD-10-CM | POA: Diagnosis not present

## 2017-11-25 DIAGNOSIS — E1142 Type 2 diabetes mellitus with diabetic polyneuropathy: Secondary | ICD-10-CM | POA: Diagnosis not present

## 2017-11-25 DIAGNOSIS — L97422 Non-pressure chronic ulcer of left heel and midfoot with fat layer exposed: Secondary | ICD-10-CM | POA: Diagnosis not present

## 2017-11-25 DIAGNOSIS — N182 Chronic kidney disease, stage 2 (mild): Secondary | ICD-10-CM | POA: Diagnosis not present

## 2017-11-25 DIAGNOSIS — Z8673 Personal history of transient ischemic attack (TIA), and cerebral infarction without residual deficits: Secondary | ICD-10-CM | POA: Diagnosis not present

## 2017-11-25 DIAGNOSIS — Z79899 Other long term (current) drug therapy: Secondary | ICD-10-CM | POA: Diagnosis not present

## 2017-11-25 DIAGNOSIS — L97529 Non-pressure chronic ulcer of other part of left foot with unspecified severity: Secondary | ICD-10-CM | POA: Diagnosis not present

## 2017-11-25 DIAGNOSIS — Z950 Presence of cardiac pacemaker: Secondary | ICD-10-CM | POA: Diagnosis not present

## 2017-11-25 DIAGNOSIS — I129 Hypertensive chronic kidney disease with stage 1 through stage 4 chronic kidney disease, or unspecified chronic kidney disease: Secondary | ICD-10-CM | POA: Diagnosis not present

## 2017-11-25 DIAGNOSIS — I482 Chronic atrial fibrillation: Secondary | ICD-10-CM | POA: Diagnosis not present

## 2017-11-25 DIAGNOSIS — M19072 Primary osteoarthritis, left ankle and foot: Secondary | ICD-10-CM | POA: Diagnosis not present

## 2017-12-01 ENCOUNTER — Ambulatory Visit (INDEPENDENT_AMBULATORY_CARE_PROVIDER_SITE_OTHER): Payer: Medicare Other | Admitting: Sports Medicine

## 2017-12-01 ENCOUNTER — Encounter: Payer: Self-pay | Admitting: Sports Medicine

## 2017-12-01 ENCOUNTER — Other Ambulatory Visit: Payer: Self-pay | Admitting: Sports Medicine

## 2017-12-01 ENCOUNTER — Ambulatory Visit (INDEPENDENT_AMBULATORY_CARE_PROVIDER_SITE_OTHER): Payer: Medicare Other

## 2017-12-01 DIAGNOSIS — M779 Enthesopathy, unspecified: Secondary | ICD-10-CM

## 2017-12-01 DIAGNOSIS — M79672 Pain in left foot: Secondary | ICD-10-CM

## 2017-12-01 DIAGNOSIS — L97421 Non-pressure chronic ulcer of left heel and midfoot limited to breakdown of skin: Secondary | ICD-10-CM

## 2017-12-01 DIAGNOSIS — Z9889 Other specified postprocedural states: Secondary | ICD-10-CM

## 2017-12-01 DIAGNOSIS — M216X9 Other acquired deformities of unspecified foot: Secondary | ICD-10-CM

## 2017-12-01 DIAGNOSIS — L97521 Non-pressure chronic ulcer of other part of left foot limited to breakdown of skin: Secondary | ICD-10-CM

## 2017-12-01 NOTE — Progress Notes (Addendum)
Subjective: Cindy Hahn is a 83 y.o. female patient seen today in office for POV #1 (DOS 11-25-17), S/P left foot wound debridement x3 and removal of fifth metatarsal head with application of Osiris grafts, Patient denies pain at surgical site, denies calf pain, denies headache, chest pain, shortness of breath, nausea, vomiting, fever, or chills. Patient states that she is doing good and only had to take tramadol twice. No other issues noted.   Patient is assisted by daughter this visit who helps to report this history.  Patient Active Problem List   Diagnosis Date Noted  . Diabetic polyneuropathy associated with type 2 diabetes mellitus ([place]) 06/04/2015  . Skin ulcer of left heel, limited to breakdown of skin ([place]) 06/04/2015  . Pacemaker 03/11/2015  . Bradycardia 02/06/2015  . Chronic atrial fibrillation ([place]) 11/01/2014  . Essential hypertension 11/01/2014  . History of stroke 11/01/2014    Current Outpatient Medications on File Prior to Visit  Medication Sig Dispense Refill  . ALPRAZolam (XANAX) 1 MG tablet Take 1 mg by mouth.    Marland Kitchen amoxicillin-clavulanate (AUGMENTIN) 875-125 MG tablet Take 1 tablet by mouth 2 (two) times daily. 28 tablet 0  . anastrozole (ARIMIDEX) 1 MG tablet take 1 tablet by mouth once daily to [place].  0  . aspirin EC 81 MG tablet Take 81 mg by mouth.    . ciprofloxacin (CIPRO) 500 MG tablet Take 1 tablet (500 mg total) by mouth 2 (two) times daily. 28 tablet 0  . digoxin (LANOXIN) 0.125 MG tablet Take 0.0625 mg by mouth daily.   0  . diltiazem (CARDIZEM) 60 MG tablet     . fluconazole (DIFLUCAN) 150 MG tablet TAKE 1 TABLET BY MOUTH AS A SINGLE DOSE 1 tablet 0  . LEVEMIR 100 UNIT/ML injection     . lidocaine-prilocaine (EMLA) cream Apply 1 application topically as needed. For pain to left foot 30 g 1  . metoprolol tartrate (LOPRESSOR) 50 MG tablet take 1 and 1/2 tablet by mouth twice a day  0  . NOVOLOG FLEXPEN 100 UNIT/ML FlexPen  inject 10 units SUBCUTANEOUSLY WITH MAIN MEAL  0  . potassium chloride SA (K-DUR,KLOR-CON) 20 MEQ tablet Take 20 mEq by mouth daily.   0  . rosuvastatin (CRESTOR) 20 MG tablet   0  . sertraline (ZOLOFT) 100 MG tablet     . sodium hypochlorite (DAKIN'S 1/2 STRENGTH) external solution Use to left heel ulceration until improved in appreance 473 mL 0  . Wound Dressings (MEDIHONEY WOUND/BURN DRESSING) GEL Apply a small amount to heel ulceration 3x per week 44 mL 1   Current Facility-Administered Medications on File Prior to Visit  Medication Dose Route Frequency Provider Last Rate Last Dose  . triamcinolone acetonide (KENALOG) 10 MG/ML injection 10 mg  10 mg Other Once Landis Martins, DPM        Allergies  Allergen Reactions  . Levofloxacin Other (See Comments)    confusion    Objective: There were no vitals filed for this visit.  General: No acute distress, AAOx3  Left foot: Sutures intact with no gapping or dehiscence at surgical sites with graft fully incorporated, mild swelling to left foot, no erythema, no warmth, no drainage, no signs of infection noted, Capillary fill time <3 seconds in all digits, gross sensation present via light touch to left foot.  No pain or crepitation with range of motion left foot.  No pain with calf compression.   Left foot x-rays consistent with postoperative  status no new or acute findings.  Assessment and Plan:  Problem List Items Addressed This Visit    None    Visit Diagnoses    Post-operative state    -  Primary   Relevant Orders   DG Foot Complete Left   S/P foot surgery, left       Capsulitis       Foot ulcer, limited to breakdown of skin, left (HCC)       Heel ulcer, left, limited to breakdown of skin (HCC)       Prominent metatarsal head, unspecified laterality       Left foot pain           -Patient seen and evaluated -X-rays reviewed -Applied Adaptic and dry sterile dressing to surgical site left foot secured with ACE wrap and  stockinet  -Advised patient to make sure to keep dressings clean, dry, and intact to left foot, removing the ACE as needed  -Advised patient to continue with post-op shoe on left foot and to limit ambulation -Advised patient to limit activity to necessity  -Advised patient to ice and elevate as necessary  -Will plan for wound check and suture removal in 2 weeks. In the meantime, patient to call office if any issues or problems arise.   Landis Martins, DPM

## 2017-12-01 NOTE — Telephone Encounter (Signed)
err

## 2017-12-02 ENCOUNTER — Encounter: Payer: Medicare Other | Admitting: Sports Medicine

## 2017-12-09 ENCOUNTER — Ambulatory Visit (INDEPENDENT_AMBULATORY_CARE_PROVIDER_SITE_OTHER): Payer: Medicare Other | Admitting: Sports Medicine

## 2017-12-09 ENCOUNTER — Encounter: Payer: Self-pay | Admitting: Sports Medicine

## 2017-12-09 DIAGNOSIS — Z9889 Other specified postprocedural states: Secondary | ICD-10-CM

## 2017-12-09 MED ORDER — FLUCONAZOLE 150 MG PO TABS: 150.0000 mg | ORAL_TABLET | Freq: Once | ORAL | 0 refills | Status: AC

## 2017-12-09 NOTE — Progress Notes (Signed)
Subjective: Cindy Hahn is a 83 y.o. female patient seen today in office for POV #2 (DOS 11-25-17), S/P left foot wound debridement x3 and removal of fifth metatarsal head with application of Osiris grafts, Patient denies pain at surgical site, since surgery only had one episode of sharp pain states that he feels good being pain-free, denies calf pain, denies headache, chest pain, shortness of breath, nausea, vomiting, fever, or chills. Patient states that she does feel a little weak after having surgery and has had a yeast infection and is requesting Diflucan. No other issues noted.   Patient is assisted by friend this visit.   Patient Active Problem List   Diagnosis Date Noted  . Diabetic polyneuropathy associated with type 2 diabetes mellitus ([place]) 06/04/2015  . Skin ulcer of left heel, limited to breakdown of skin ([place]) 06/04/2015  . Pacemaker 03/11/2015  . Bradycardia 02/06/2015  . Chronic atrial fibrillation ([place]) 11/01/2014  . Essential hypertension 11/01/2014  . History of stroke 11/01/2014    Current Outpatient Medications on File Prior to Visit  Medication Sig Dispense Refill  . ALPRAZolam (XANAX) 1 MG tablet Take 1 mg by mouth.    Marland Kitchen amoxicillin-clavulanate (AUGMENTIN) 875-125 MG tablet Take 1 tablet by mouth 2 (two) times daily. 28 tablet 0  . anastrozole (ARIMIDEX) 1 MG tablet take 1 tablet by mouth once daily to [place].  0  . aspirin EC 81 MG tablet Take 81 mg by mouth.    . ciprofloxacin (CIPRO) 500 MG tablet Take 1 tablet (500 mg total) by mouth 2 (two) times daily. 28 tablet 0  . digoxin (LANOXIN) 0.125 MG tablet Take 0.0625 mg by mouth daily.   0  . diltiazem (CARDIZEM) 60 MG tablet     . fluconazole (DIFLUCAN) 150 MG tablet TAKE 1 TABLET BY MOUTH AS A SINGLE DOSE 1 tablet 0  . LEVEMIR 100 UNIT/ML injection     . lidocaine-prilocaine (EMLA) cream Apply 1 application topically as needed. For pain to left foot 30 g 1  . metoprolol tartrate  (LOPRESSOR) 50 MG tablet take 1 and 1/2 tablet by mouth twice a day  0  . NOVOLOG FLEXPEN 100 UNIT/ML FlexPen inject 10 units SUBCUTANEOUSLY WITH MAIN MEAL  0  . potassium chloride SA (K-DUR,KLOR-CON) 20 MEQ tablet Take 20 mEq by mouth daily.   0  . rosuvastatin (CRESTOR) 20 MG tablet   0  . sertraline (ZOLOFT) 100 MG tablet     . sodium hypochlorite (DAKIN'S 1/2 STRENGTH) external solution Use to left heel ulceration until improved in appreance 473 mL 0  . Wound Dressings (MEDIHONEY WOUND/BURN DRESSING) GEL Apply a small amount to heel ulceration 3x per week 44 mL 1   Current Facility-Administered Medications on File Prior to Visit  Medication Dose Route Frequency Provider Last Rate Last Dose  . triamcinolone acetonide (KENALOG) 10 MG/ML injection 10 mg  10 mg Other Once Landis Martins, DPM        Allergies  Allergen Reactions  . Levofloxacin Other (See Comments)    confusion    Objective: There were no vitals filed for this visit.  General: No acute distress, AAOx3  Left foot: Sutures intact with no gapping or dehiscence at surgical sites with graft fully incorporated at the plantar heel sub-met 1 and sub-met 5, mild swelling to left foot, no erythema, no warmth, no drainage, no signs of infection noted, Capillary fill time <3 seconds in all digits, gross sensation present via light touch  to left foot.  No pain or crepitation with range of motion left foot.  No pain with calf compression.   Assessment and Plan:  Problem List Items Addressed This Visit    None    Visit Diagnoses    Post-operative state    -  Primary   S/P foot surgery, left           -Patient seen and evaluated -Applied Adaptic and dry sterile dressing to surgical site left foot secured with ACE wrap and stockinet  -Advised patient to make sure to keep dressings clean, dry, and intact to left foot, removing the ACE as needed  -Advised patient to continue with post-op shoe on left foot and to limit  ambulation -Prescribe Diflucan to take as instructed -Advised patient to limit activity to necessity  -Advised patient to ice and elevate as necessary  -Will plan for wound check and suture removal in 1 week at graft sites. In the meantime, patient to call office if any issues or problems arise.   Landis Martins, DPM

## 2017-12-17 ENCOUNTER — Ambulatory Visit (INDEPENDENT_AMBULATORY_CARE_PROVIDER_SITE_OTHER): Payer: Medicare Other | Admitting: Sports Medicine

## 2017-12-17 ENCOUNTER — Encounter: Payer: Self-pay | Admitting: Sports Medicine

## 2017-12-17 DIAGNOSIS — M216X9 Other acquired deformities of unspecified foot: Secondary | ICD-10-CM

## 2017-12-17 DIAGNOSIS — M779 Enthesopathy, unspecified: Secondary | ICD-10-CM

## 2017-12-17 DIAGNOSIS — L97521 Non-pressure chronic ulcer of other part of left foot limited to breakdown of skin: Secondary | ICD-10-CM

## 2017-12-17 DIAGNOSIS — M79672 Pain in left foot: Secondary | ICD-10-CM

## 2017-12-17 DIAGNOSIS — L97421 Non-pressure chronic ulcer of left heel and midfoot limited to breakdown of skin: Secondary | ICD-10-CM

## 2017-12-17 DIAGNOSIS — Z9889 Other specified postprocedural states: Secondary | ICD-10-CM

## 2017-12-17 NOTE — Progress Notes (Signed)
Subjective: Cindy Hahn is a 83 y.o. female patient seen today in office for POV #3 (DOS 11-25-17), S/P left foot wound debridement x3 and removal of fifth metatarsal head with application of Osiris grafts, Patient denies pain at surgical site, denies calf pain, denies headache, chest pain, shortness of breath, nausea, vomiting, fever, or chills. No other issues noted.   Patient is assisted by daughter this visit.   Patient Active Problem List   Diagnosis Date Noted  . Diabetic polyneuropathy associated with type 2 diabetes mellitus ([place]) 06/04/2015  . Skin ulcer of left heel, limited to breakdown of skin ([place]) 06/04/2015  . Pacemaker 03/11/2015  . Bradycardia 02/06/2015  . Chronic atrial fibrillation ([place]) 11/01/2014  . Essential hypertension 11/01/2014  . History of stroke 11/01/2014    Current Outpatient Medications on File Prior to Visit  Medication Sig Dispense Refill  . ALPRAZolam (XANAX) 1 MG tablet Take 1 mg by mouth.    Marland Kitchen anastrozole (ARIMIDEX) 1 MG tablet take 1 tablet by mouth once daily to [place].  0  . aspirin EC 81 MG tablet Take 81 mg by mouth.    . digoxin (LANOXIN) 0.125 MG tablet Take 0.0625 mg by mouth daily.   0  . diltiazem (CARDIZEM) 60 MG tablet     . LEVEMIR 100 UNIT/ML injection     . lidocaine-prilocaine (EMLA) cream Apply 1 application topically as needed. For pain to left foot 30 g 1  . metoprolol tartrate (LOPRESSOR) 50 MG tablet take 1 and 1/2 tablet by mouth twice a day  0  . NOVOLOG FLEXPEN 100 UNIT/ML FlexPen inject 10 units SUBCUTANEOUSLY WITH MAIN MEAL  0  . potassium chloride SA (K-DUR,KLOR-CON) 20 MEQ tablet Take 20 mEq by mouth daily.   0  . rosuvastatin (CRESTOR) 20 MG tablet   0  . sertraline (ZOLOFT) 100 MG tablet      Current Facility-Administered Medications on File Prior to Visit  Medication Dose Route Frequency Provider Last Rate Last Dose  . triamcinolone acetonide (KENALOG) 10 MG/ML injection 10 mg  10 mg  Other Once Landis Martins, DPM        Allergies  Allergen Reactions  . Levofloxacin Other (See Comments)    confusion    Objective: There were no vitals filed for this visit.  General: No acute distress, AAOx3  Left foot: Sutures intact with no gapping or dehiscence at surgical sites with graft fully incorporated at the plantar heel sub-met 1 and sub-met 5, mild swelling to left foot, no erythema, no warmth, no drainage, no signs of infection noted, Capillary fill time <3 seconds in all digits, gross sensation present via light touch to left foot.  No pain or crepitation with range of motion left foot.  No pain with calf compression.   Assessment and Plan:  Problem List Items Addressed This Visit    None    Visit Diagnoses    Post-operative state    -  Primary   S/P foot surgery, left       Capsulitis       Foot ulcer, limited to breakdown of skin, left (HCC)       Heel ulcer, left, limited to breakdown of skin (HCC)       Prominent metatarsal head, unspecified laterality       Left foot pain           -Patient seen and evaluated -Sutures removed at the dorsal aspect of the foot however plantar  sutures will remain in place until next visit. -Applied Adaptic and dry sterile dressing to surgical site left foot secured with ACE wrap and stockinet  -Advised patient to make sure to keep dressings clean, dry, and intact to left foot, removing the ACE as needed  -Advised patient to continue with post-op shoe on left foot and to limit ambulation -Advised patient to limit activity to necessity  -Advised patient to ice and elevate as necessary  -Will plan for wound check and suture removal in 1 week at graft sites. In the meantime, patient to call office if any issues or problems arise.   Landis Martins, DPM

## 2017-12-21 DIAGNOSIS — H35373 Puckering of macula, bilateral: Secondary | ICD-10-CM | POA: Diagnosis not present

## 2017-12-21 DIAGNOSIS — Z961 Presence of intraocular lens: Secondary | ICD-10-CM | POA: Diagnosis not present

## 2017-12-21 DIAGNOSIS — E113293 Type 2 diabetes mellitus with mild nonproliferative diabetic retinopathy without macular edema, bilateral: Secondary | ICD-10-CM | POA: Diagnosis not present

## 2017-12-24 ENCOUNTER — Encounter

## 2017-12-24 ENCOUNTER — Encounter: Payer: Self-pay | Admitting: Sports Medicine

## 2017-12-24 ENCOUNTER — Ambulatory Visit (INDEPENDENT_AMBULATORY_CARE_PROVIDER_SITE_OTHER): Payer: Medicare Other | Admitting: Sports Medicine

## 2017-12-24 DIAGNOSIS — M79672 Pain in left foot: Secondary | ICD-10-CM

## 2017-12-24 DIAGNOSIS — M779 Enthesopathy, unspecified: Secondary | ICD-10-CM | POA: Diagnosis not present

## 2017-12-24 DIAGNOSIS — M216X9 Other acquired deformities of unspecified foot: Secondary | ICD-10-CM

## 2017-12-24 DIAGNOSIS — L97421 Non-pressure chronic ulcer of left heel and midfoot limited to breakdown of skin: Secondary | ICD-10-CM | POA: Diagnosis not present

## 2017-12-24 DIAGNOSIS — L97521 Non-pressure chronic ulcer of other part of left foot limited to breakdown of skin: Secondary | ICD-10-CM | POA: Diagnosis not present

## 2017-12-24 DIAGNOSIS — Z9889 Other specified postprocedural states: Secondary | ICD-10-CM | POA: Diagnosis not present

## 2017-12-24 NOTE — Progress Notes (Signed)
Subjective: Cindy Hahn is a 83 y.o. female patient seen today in office for POV #4 ( DOS 11-25-17), S/P left foot wound debridement x3 and removal of fifth metatarsal head with application of Osiris grafts, Patient admits a little soreness at the surgical site, denies calf pain, denies headache, chest pain, shortness of breath, nausea, vomiting, fever, or chills. No other issues noted.   Patient is assisted by daughter this visit.   Patient Active Problem List   Diagnosis Date Noted  . Diabetic polyneuropathy associated with type 2 diabetes mellitus ([place]) 06/04/2015  . Skin ulcer of left heel, limited to breakdown of skin ([place]) 06/04/2015  . Pacemaker 03/11/2015  . Bradycardia 02/06/2015  . Chronic atrial fibrillation ([place]) 11/01/2014  . Essential hypertension 11/01/2014  . History of stroke 11/01/2014    Current Outpatient Medications on File Prior to Visit  Medication Sig Dispense Refill  . ALPRAZolam (XANAX) 1 MG tablet Take 1 mg by mouth.    Marland Kitchen anastrozole (ARIMIDEX) 1 MG tablet take 1 tablet by mouth once daily to [place].  0  . aspirin EC 81 MG tablet Take 81 mg by mouth.    . digoxin (LANOXIN) 0.125 MG tablet Take 0.0625 mg by mouth daily.   0  . diltiazem (CARDIZEM) 60 MG tablet     . LEVEMIR 100 UNIT/ML injection     . lidocaine-prilocaine (EMLA) cream Apply 1 application topically as needed. For pain to left foot 30 g 1  . metoprolol tartrate (LOPRESSOR) 50 MG tablet take 1 and 1/2 tablet by mouth twice a day  0  . NOVOLOG FLEXPEN 100 UNIT/ML FlexPen inject 10 units SUBCUTANEOUSLY WITH MAIN MEAL  0  . potassium chloride SA (K-DUR,KLOR-CON) 20 MEQ tablet Take 20 mEq by mouth daily.   0  . rosuvastatin (CRESTOR) 20 MG tablet   0  . sertraline (ZOLOFT) 100 MG tablet      Current Facility-Administered Medications on File Prior to Visit  Medication Dose Route Frequency Provider Last Rate Last Dose  . triamcinolone acetonide (KENALOG) 10 MG/ML  injection 10 mg  10 mg Other Once Landis Martins, DPM        Allergies  Allergen Reactions  . Levofloxacin Other (See Comments)    confusion    Objective: There were no vitals filed for this visit.  General: No acute distress, AAOx3  Left foot: Sutures intact with no gapping or dehiscence at surgical sites with graft fully incorporated at the plantar heel sub-met 1 and sub-met 5, mild swelling to left foot, no erythema, no warmth, no drainage, no signs of infection noted, Capillary fill time <3 seconds in all digits, gross sensation present via light touch to left foot.  No pain or crepitation with range of motion left foot.  No pain with calf compression.   Assessment and Plan:  Problem List Items Addressed This Visit    None    Visit Diagnoses    Post-operative state    -  Primary   S/P foot surgery, left       Capsulitis       Foot ulcer, limited to breakdown of skin, left (HCC)       Heel ulcer, left, limited to breakdown of skin (HCC)       Prominent metatarsal head, unspecified laterality       Left foot pain           -Patient seen and evaluated -Sutures removed at graft site and reactive  dried blood was mechanically debrided using a sterile chisel blade revealing no underlying opening at the previous ulceration sites -Applied Steri-Strips to the dorsal foot incision site area and a clean stockinette -Advised patient may shower on tomorrow and redressed area with Steri-Strips if needed -Continue with postop shoe -Advised patient to limit activity to necessity  -Advised patient to ice and elevate as necessary  -Will plan for wound check and possibly allowing patient to return to normal shoes at next visit.  In the meantime, patient to call office if any issues or problems arise.   Landis Martins, DPM

## 2017-12-29 ENCOUNTER — Encounter: Payer: Self-pay | Admitting: Sports Medicine

## 2017-12-29 ENCOUNTER — Ambulatory Visit (INDEPENDENT_AMBULATORY_CARE_PROVIDER_SITE_OTHER): Payer: Medicare Other | Admitting: Sports Medicine

## 2017-12-29 VITALS — BP 115/68 | HR 130 | Temp 96.8°F | Resp 15

## 2017-12-29 DIAGNOSIS — L97421 Non-pressure chronic ulcer of left heel and midfoot limited to breakdown of skin: Secondary | ICD-10-CM | POA: Diagnosis not present

## 2017-12-29 DIAGNOSIS — L97521 Non-pressure chronic ulcer of other part of left foot limited to breakdown of skin: Secondary | ICD-10-CM | POA: Diagnosis not present

## 2017-12-29 DIAGNOSIS — Z9889 Other specified postprocedural states: Secondary | ICD-10-CM

## 2017-12-29 DIAGNOSIS — F0150 Vascular dementia without behavioral disturbance: Secondary | ICD-10-CM | POA: Diagnosis not present

## 2017-12-29 DIAGNOSIS — M79672 Pain in left foot: Secondary | ICD-10-CM

## 2017-12-29 DIAGNOSIS — E1142 Type 2 diabetes mellitus with diabetic polyneuropathy: Secondary | ICD-10-CM

## 2017-12-29 DIAGNOSIS — M216X9 Other acquired deformities of unspecified foot: Secondary | ICD-10-CM

## 2017-12-29 DIAGNOSIS — M779 Enthesopathy, unspecified: Secondary | ICD-10-CM

## 2017-12-29 NOTE — Progress Notes (Signed)
Subjective: Cindy Hahn is a 83 y.o. female patient seen today in office for POV #5 ( DOS 11-25-17), S/P left foot wound debridement x3 and removal of fifth metatarsal head with application of Osiris grafts, Patient admits a little soreness at the surgical site but otherwise states that she is doing good, denies calf pain, denies headache, chest pain, shortness of breath, nausea, vomiting, fever, or chills. No other issues noted.   Patient is assisted by daughter this visit.   Patient Active Problem List   Diagnosis Date Noted  . Diabetic polyneuropathy associated with type 2 diabetes mellitus ([place]) 06/04/2015  . Skin ulcer of left heel, limited to breakdown of skin ([place]) 06/04/2015  . Pacemaker 03/11/2015  . Bradycardia 02/06/2015  . Chronic atrial fibrillation ([place]) 11/01/2014  . Essential hypertension 11/01/2014  . History of stroke 11/01/2014    Current Outpatient Medications on File Prior to Visit  Medication Sig Dispense Refill  . ALPRAZolam (XANAX) 1 MG tablet Take 1 mg by mouth.    Marland Kitchen anastrozole (ARIMIDEX) 1 MG tablet take 1 tablet by mouth once daily to [place].  0  . aspirin EC 81 MG tablet Take 81 mg by mouth.    . digoxin (LANOXIN) 0.125 MG tablet Take 0.0625 mg by mouth daily.   0  . diltiazem (CARDIZEM) 60 MG tablet     . LEVEMIR 100 UNIT/ML injection     . lidocaine-prilocaine (EMLA) cream Apply 1 application topically as needed. For pain to left foot 30 g 1  . metoprolol tartrate (LOPRESSOR) 50 MG tablet take 1 and 1/2 tablet by mouth twice a day  0  . NOVOLOG FLEXPEN 100 UNIT/ML FlexPen inject 10 units SUBCUTANEOUSLY WITH MAIN MEAL  0  . potassium chloride SA (K-DUR,KLOR-CON) 20 MEQ tablet Take 20 mEq by mouth daily.   0  . rosuvastatin (CRESTOR) 20 MG tablet   0  . sertraline (ZOLOFT) 100 MG tablet      Current Facility-Administered Medications on File Prior to Visit  Medication Dose Route Frequency Provider Last Rate Last Dose  .  triamcinolone acetonide (KENALOG) 10 MG/ML injection 10 mg  10 mg Other Once Landis Martins, DPM        Allergies  Allergen Reactions  . Levofloxacin Other (See Comments)    confusion    Objective: There were no vitals filed for this visit.  General: No acute distress, AAOx3  Left foot: Wound sites are well-healed plantar heel sub-met 1 and sub-met 5, mild dried blood and scabbing, minimal swelling to left foot, no erythema, no warmth, no drainage, no signs of infection noted, Capillary fill time <3 seconds in all digits, gross sensation present via light touch to left foot.  No pain or crepitation with range of motion left foot.  No pain with calf compression.   Assessment and Plan:  Problem List Items Addressed This Visit      Endocrine   Diabetic polyneuropathy associated with type 2 diabetes mellitus ([place])    Other Visit Diagnoses    Post-operative state    -  Primary   S/P foot surgery, left       Capsulitis       Foot ulcer, limited to breakdown of skin, left (HCC)       Heel ulcer, left, limited to breakdown of skin (HCC)       Prominent metatarsal head, unspecified laterality       Left foot pain  Vascular dementia without behavioral disturbance           -Patient seen and evaluated -Patient may shower as normal starting tomorrow and encouraged good skin creams -Continue with postop shoe and may slowly wean to her diabetic shoes with Plastizote insoles -Advised patient to limit activity to necessity  -Advised patient to ice and elevate as necessary  -Will plan for wound check.  In the meantime, patient to call office if any issues or problems arise.   Landis Martins, DPM

## 2018-01-14 ENCOUNTER — Ambulatory Visit (INDEPENDENT_AMBULATORY_CARE_PROVIDER_SITE_OTHER): Payer: Medicare Other | Admitting: Sports Medicine

## 2018-01-14 ENCOUNTER — Encounter: Payer: Self-pay | Admitting: Sports Medicine

## 2018-01-14 VITALS — BP 112/62 | HR 109 | Temp 98.5°F | Resp 16

## 2018-01-14 DIAGNOSIS — M79671 Pain in right foot: Secondary | ICD-10-CM

## 2018-01-14 DIAGNOSIS — M79672 Pain in left foot: Secondary | ICD-10-CM

## 2018-01-14 DIAGNOSIS — Z9889 Other specified postprocedural states: Secondary | ICD-10-CM

## 2018-01-14 DIAGNOSIS — L84 Corns and callosities: Secondary | ICD-10-CM

## 2018-01-14 NOTE — Progress Notes (Signed)
Subjective: Cindy Hahn is a 83 y.o. female patient seen today in office for POV #6 ( DOS 11-25-17), S/P left foot wound debridement x3 and removal of fifth metatarsal head with application of Osiris grafts, Patient admits a little soreness at the left heel and some soreness on the right foot states that she had a Band-Aid on the right foot and pulled it off and with the skin underneath the first metatarsal head patient states that now she is back in her diabetic shoes and that they do not feel as comfortable as the postop shoes, denies calf pain, denies headache, chest pain, shortness of breath, nausea, vomiting, fever, or chills.  Reports her blood sugar was 170 last night and last A1c of 11.1.  No other issues noted.   Patient is assisted by daughter this visit.   Patient Active Problem List   Diagnosis Date Noted  . Diabetic polyneuropathy associated with type 2 diabetes mellitus ([place]) 06/04/2015  . Skin ulcer of left heel, limited to breakdown of skin ([place]) 06/04/2015  . Pacemaker 03/11/2015  . Bradycardia 02/06/2015  . Chronic atrial fibrillation ([place]) 11/01/2014  . Essential hypertension 11/01/2014  . History of stroke 11/01/2014    Current Outpatient Medications on File Prior to Visit  Medication Sig Dispense Refill  . ALPRAZolam (XANAX) 1 MG tablet Take 1 mg by mouth.    Marland Kitchen anastrozole (ARIMIDEX) 1 MG tablet take 1 tablet by mouth once daily to [place].  0  . aspirin EC 81 MG tablet Take 81 mg by mouth.    . digoxin (LANOXIN) 0.125 MG tablet Take 0.0625 mg by mouth daily.   0  . diltiazem (CARDIZEM) 60 MG tablet     . LEVEMIR 100 UNIT/ML injection     . lidocaine-prilocaine (EMLA) cream Apply 1 application topically as needed. For pain to left foot 30 g 1  . metoprolol tartrate (LOPRESSOR) 50 MG tablet take 1 and 1/2 tablet by mouth twice a day  0  . NOVOLOG FLEXPEN 100 UNIT/ML FlexPen inject 10 units SUBCUTANEOUSLY WITH MAIN MEAL  0  . potassium  chloride SA (K-DUR,KLOR-CON) 20 MEQ tablet Take 20 mEq by mouth daily.   0  . rosuvastatin (CRESTOR) 20 MG tablet   0  . sertraline (ZOLOFT) 100 MG tablet      Current Facility-Administered Medications on File Prior to Visit  Medication Dose Route Frequency Provider Last Rate Last Dose  . triamcinolone acetonide (KENALOG) 10 MG/ML injection 10 mg  10 mg Other Once Landis Martins, DPM        Allergies  Allergen Reactions  . Levofloxacin Other (See Comments)    confusion    Objective: There were no vitals filed for this visit.  General: No acute distress, AAOx3  Left foot: Wound sites are well-healed plantar heel sub-met 1 and sub-met 5, mild dried blood and scabbing, minimal swelling to left foot, no erythema, no warmth, no drainage, no signs of infection noted, Capillary fill time <3 seconds in all digits, gross sensation present via light touch to left foot.  No pain or crepitation with range of motion left foot.  No pain with calf compression.  Right foot: Pre-ulcerative callus sub-met one and 5 there is a small abrasion to the sub-met 1 callus, neurovascular status intact, prominent metatarsal head with surrounding arthritis.  No other acute findings.  Assessment and Plan:  Problem List Items Addressed This Visit    None    Visit Diagnoses  Post-operative state    -  Primary   S/P foot surgery, left       Pre-ulcerative calluses       Foot pain, bilateral           -Patient seen and evaluated -Recommend patient to remove custom insoles from her diabetic shoes and to wear them without the insoles to see if this will provide additional comfort and advised patient if this works with removing the insoles then would recommend [place] to have her diabetic insoles further modified to be more comfortable -Advised patient to use daily antibiotic cream gauze and a Band-Aid dressing to the right foot and to refrain from walking barefoot at home -Advised patient and daughter to closely  monitor for any signs of infection and if noted to return to office sooner -Will plan for wound check at next office visit.  In the meantime, patient to call office if any issues or problems arise.   Landis Martins, DPM

## 2018-01-20 NOTE — Progress Notes (Signed)
DOS: 11-25-2017 Left foot wound debridement with placement of graft, removal of 5th metatarsal head for painful callous/ulcer  [facility]

## 2018-01-25 ENCOUNTER — Other Ambulatory Visit: Payer: Self-pay | Admitting: Sports Medicine

## 2018-01-25 ENCOUNTER — Telehealth: Payer: Self-pay | Admitting: Sports Medicine

## 2018-01-25 DIAGNOSIS — L97521 Non-pressure chronic ulcer of other part of left foot limited to breakdown of skin: Secondary | ICD-10-CM

## 2018-01-25 MED ORDER — CLINDAMYCIN HCL 300 MG PO CAPS: 300.0000 mg | ORAL_CAPSULE | Freq: Three times a day (TID) | ORAL | 0 refills | Status: DC

## 2018-01-25 NOTE — Telephone Encounter (Signed)
My mother has started to have drainage from the left ulcer. Was wondering if an antibiotic could be called in and also if she could be seen sooner than next Friday? I'm out of town this week so I would not be able to get her in until next week.

## 2018-01-25 NOTE — Progress Notes (Signed)
Sent clindamycin to pharmacy and to follow next available when the daughter can get her in to office -Dr. Cannon Kettle

## 2018-01-26 NOTE — Telephone Encounter (Signed)
Called and spoke with patients daughter informed her Rx was called in last evening and rescheduled the appt for Wednesday instead of Friday

## 2018-01-26 NOTE — Telephone Encounter (Signed)
I called yesterday in regards to my mother. I'm not in town but I was wondering if you could call in an antibiotic for her? She is having drainage on her opposite foot and Dr. Cannon Kettle is aware of changes in that foot. It is foul smelling and she's starting to acing confused which is a sign she is starting to get an infection. Please let me know what the plan is so I can have my son pick up the prescription. I can be reached at [phone]. Thank you.

## 2018-02-02 ENCOUNTER — Ambulatory Visit (INDEPENDENT_AMBULATORY_CARE_PROVIDER_SITE_OTHER): Payer: Medicare Other | Admitting: Sports Medicine

## 2018-02-02 ENCOUNTER — Encounter: Payer: Self-pay | Admitting: Sports Medicine

## 2018-02-02 ENCOUNTER — Ambulatory Visit (INDEPENDENT_AMBULATORY_CARE_PROVIDER_SITE_OTHER): Payer: Medicare Other

## 2018-02-02 VITALS — BP 93/56 | HR 93 | Resp 16

## 2018-02-02 DIAGNOSIS — M79671 Pain in right foot: Secondary | ICD-10-CM | POA: Diagnosis not present

## 2018-02-02 DIAGNOSIS — L97522 Non-pressure chronic ulcer of other part of left foot with fat layer exposed: Secondary | ICD-10-CM

## 2018-02-02 DIAGNOSIS — L02619 Cutaneous abscess of unspecified foot: Secondary | ICD-10-CM

## 2018-02-02 DIAGNOSIS — L02612 Cutaneous abscess of left foot: Secondary | ICD-10-CM

## 2018-02-02 DIAGNOSIS — L03119 Cellulitis of unspecified part of limb: Secondary | ICD-10-CM

## 2018-02-02 DIAGNOSIS — L84 Corns and callosities: Secondary | ICD-10-CM | POA: Diagnosis not present

## 2018-02-02 DIAGNOSIS — M79672 Pain in left foot: Secondary | ICD-10-CM

## 2018-02-02 DIAGNOSIS — Z9889 Other specified postprocedural states: Secondary | ICD-10-CM

## 2018-02-02 DIAGNOSIS — L97421 Non-pressure chronic ulcer of left heel and midfoot limited to breakdown of skin: Secondary | ICD-10-CM | POA: Diagnosis not present

## 2018-02-02 DIAGNOSIS — L97521 Non-pressure chronic ulcer of other part of left foot limited to breakdown of skin: Secondary | ICD-10-CM | POA: Diagnosis not present

## 2018-02-02 MED ORDER — TEDIZOLID PHOSPHATE 200 MG PO TABS: 200.0000 mg | ORAL_TABLET | Freq: Every day | ORAL | 0 refills | Status: DC

## 2018-02-02 NOTE — Progress Notes (Signed)
Subjective: Cindy Hahn is a 83 y.o. female patient seen today in office for POV #7 ( DOS 11-25-17), S/P left foot wound debridement x3 and removal of fifth metatarsal head with application of Osiris grafts, Patient admits pain to left heel pain warm and redness that started last week; took clindamycin but did not help and reports also she had some drainage from the right foot as well. States that her diabetic shoes hurt her feet. + weakness but denies nausea vomiting fever chills or any other constitutional symptoms at this time.  No other issues noted.   Patient is assisted by daughter this visit.   Patient Active Problem List   Diagnosis Date Noted  . Diabetic polyneuropathy associated with type 2 diabetes mellitus ([place]) 06/04/2015  . Skin ulcer of left heel, limited to breakdown of skin ([place]) 06/04/2015  . Pacemaker 03/11/2015  . Bradycardia 02/06/2015  . Chronic atrial fibrillation 11/01/2014  . Essential hypertension 11/01/2014  . History of stroke 11/01/2014    Current Outpatient Medications on File Prior to Visit  Medication Sig Dispense Refill  . ALPRAZolam (XANAX) 1 MG tablet Take 1 mg by mouth.    Marland Kitchen anastrozole (ARIMIDEX) 1 MG tablet take 1 tablet by mouth once daily to [place].  0  . aspirin EC 81 MG tablet Take 81 mg by mouth.    . clindamycin (CLEOCIN) 300 MG capsule Take 1 capsule (300 mg total) by mouth 3 (three) times daily. 30 capsule 0  . digoxin (LANOXIN) 0.125 MG tablet Take 0.0625 mg by mouth daily.   0  . diltiazem (CARDIZEM) 60 MG tablet     . LEVEMIR 100 UNIT/ML injection     . lidocaine-prilocaine (EMLA) cream Apply 1 application topically as needed. For pain to left foot 30 g 1  . metoprolol tartrate (LOPRESSOR) 50 MG tablet take 1 and 1/2 tablet by mouth twice a day  0  . NOVOLOG FLEXPEN 100 UNIT/ML FlexPen inject 10 units SUBCUTANEOUSLY WITH MAIN MEAL  0  . potassium chloride SA (K-DUR,KLOR-CON) 20 MEQ tablet Take 20 mEq by mouth  daily.   0  . rosuvastatin (CRESTOR) 20 MG tablet   0  . sertraline (ZOLOFT) 100 MG tablet      Current Facility-Administered Medications on File Prior to Visit  Medication Dose Route Frequency Provider Last Rate Last Dose  . triamcinolone acetonide (KENALOG) 10 MG/ML injection 10 mg  10 mg Other Once Landis Martins, DPM        Allergies  Allergen Reactions  . Levofloxacin Other (See Comments)    confusion    Objective: There were no vitals filed for this visit.  General: No acute distress, AAOx3  Left foot: There is a fluctuant callus at the left plantar heel wants debrided revealing a ulceration that measures 3 x 3 cm and probes to soft tissue in range down deep at least 0.5 cm to heel bone however there is no visible bone noted there is surrounding redness and warmth that is localized to the area seropurulent drainage no other signs of infection noted no malodor.  Neurovascular unchanged on the left foot.  Previous callus and surgical site at area of fifth metatarsal head resection well-healed on the left. Right foot: Pre-ulcerative callus sub-met one and 5 there is a small abrasion to the sub-met 1 callus that appears to be well-healed, neurovascular status intact, prominent metatarsal head with surrounding arthritis.  No other acute findings.    Assessment and Plan:  Problem List Items Addressed This Visit    None    Visit Diagnoses    Bilateral foot pain    -  Primary   Relevant Orders   DG Foot Complete Right   DG Foot Complete Left   Foot ulcer with fat layer exposed, left ([place])       Relevant Orders   DG Foot Complete Right   DG Foot Complete Left   WOUND CULTURE   Post-operative state       Relevant Orders   DG Foot Complete Right   DG Foot Complete Left   WOUND CULTURE   Pre-ulcerative calluses       Relevant Orders   WOUND CULTURE   Abscess of left foot       Relevant Orders   WOUND CULTURE   Cellulitis and abscess of foot, except toes       Relevant  Orders   WOUND CULTURE       -Patient seen and evaluated -X-rays reviewed revealing arthritis with heel spurs bilateral no acute osseous finding at area of ulceration at left heel - Excisionally dedbrided ulceration at left heel to healthy bleeding borders removing nonviable tissue using a sterile chisel blade. Wound measures post debridement as above. Wound was debrided to the level of the subcutaneous tissue with viable wound base exposed to promote healing. Hemostasis was achieved with manuel pressure. Patient tolerated procedure well without any discomfort or anesthesia necessary for this wound debridement.  -Applied gauze packing and dry sterile dressing and instructed patient to continue with daily dressings at home consisting of same every other day -To the right foot mechanically debrided callus using sterile chisel blade at right foot plantar first metatarsal with no underlying opening but did apply protective Band-Aid dressing -Prescribed Sivextro 6 days patient to discontinue clindamycin -Dispensed new postoperative shoes bilateral - Advised patient to go to the ER or return to office if the wound worsens or if constitutional symptoms are present. -Return to office in 1 week  Landis Martins, DPM

## 2018-02-03 ENCOUNTER — Other Ambulatory Visit: Payer: Self-pay | Admitting: Sports Medicine

## 2018-02-03 DIAGNOSIS — L02619 Cutaneous abscess of unspecified foot: Secondary | ICD-10-CM

## 2018-02-03 DIAGNOSIS — L03119 Cellulitis of unspecified part of limb: Principal | ICD-10-CM

## 2018-02-03 MED ORDER — SULFAMETHOXAZOLE-TRIMETHOPRIM 400-80 MG PO TABS: 1.0000 | ORAL_TABLET | Freq: Two times a day (BID) | ORAL | 0 refills | Status: DC

## 2018-02-03 NOTE — Progress Notes (Signed)
Changed  Sivextro to Bactrim due to cost of $100 until culture results are available -Dr. Cannon Kettle

## 2018-02-04 ENCOUNTER — Encounter: Payer: Medicare Other | Admitting: Sports Medicine

## 2018-02-06 LAB — WOUND CULTURE

## 2018-02-08 ENCOUNTER — Telehealth: Payer: Self-pay | Admitting: *Deleted

## 2018-02-08 NOTE — Telephone Encounter (Signed)
I informed pt's dtr, Cecille Rubin of Dr. Leeanne Rio review of wound culture and orders. Cecille Rubin states understanding.

## 2018-02-08 NOTE — Telephone Encounter (Signed)
-----   Message from Landis Martins, [place] sent at 02/08/2018  7:07 AM EDT ----- Staph Continue with Bactrim of which she is already on  -Dr. Cannon Kettle

## 2018-02-10 ENCOUNTER — Encounter: Payer: Self-pay | Admitting: Sports Medicine

## 2018-02-10 ENCOUNTER — Ambulatory Visit (INDEPENDENT_AMBULATORY_CARE_PROVIDER_SITE_OTHER): Payer: Medicare Other | Admitting: Sports Medicine

## 2018-02-10 VITALS — BP 105/70 | HR 122 | Temp 97.4°F | Resp 16

## 2018-02-10 DIAGNOSIS — Z9889 Other specified postprocedural states: Secondary | ICD-10-CM

## 2018-02-10 DIAGNOSIS — L97522 Non-pressure chronic ulcer of other part of left foot with fat layer exposed: Secondary | ICD-10-CM

## 2018-02-10 DIAGNOSIS — L02619 Cutaneous abscess of unspecified foot: Secondary | ICD-10-CM

## 2018-02-10 DIAGNOSIS — L84 Corns and callosities: Secondary | ICD-10-CM

## 2018-02-10 DIAGNOSIS — L03119 Cellulitis of unspecified part of limb: Secondary | ICD-10-CM

## 2018-02-10 MED ORDER — FLUCONAZOLE 150 MG PO TABS: 150.0000 mg | ORAL_TABLET | Freq: Once | ORAL | 0 refills | Status: AC

## 2018-02-10 NOTE — Progress Notes (Signed)
Subjective: Cindy Hahn is a 83 y.o. female patient seen today in office for POV #8 ( DOS 11-25-17), S/P left foot wound debridement x3 and removal of fifth metatarsal head with application of Osiris grafts, Patient admits pain to left heel pain but does state that it feels like it is doing better especially after starting antibiotics after the next day of antibiotics it seems like the wound stopped draining and the pain seemed to ease up.  Patient denies nausea vomiting fever chills or any other constitutional symptoms at this time.  No other issues noted.   Patient is assisted by daughter this visit.   Patient Active Problem List   Diagnosis Date Noted  . Diabetic polyneuropathy associated with type 2 diabetes mellitus ([place]) 06/04/2015  . Skin ulcer of left heel, limited to breakdown of skin ([place]) 06/04/2015  . Pacemaker 03/11/2015  . Bradycardia 02/06/2015  . Chronic atrial fibrillation 11/01/2014  . Essential hypertension 11/01/2014  . History of stroke 11/01/2014    Current Outpatient Medications on File Prior to Visit  Medication Sig Dispense Refill  . ALPRAZolam (XANAX) 1 MG tablet Take 1 mg by mouth.    Marland Kitchen anastrozole (ARIMIDEX) 1 MG tablet take 1 tablet by mouth once daily to [place].  0  . aspirin EC 81 MG tablet Take 81 mg by mouth.    . BD INSULIN SYRINGE U/F 31G X 5/16" 1 ML MISC     . digoxin (LANOXIN) 0.125 MG tablet Take 0.0625 mg by mouth daily.   0  . diltiazem (CARDIZEM) 60 MG tablet     . LEVEMIR 100 UNIT/ML injection     . lidocaine-prilocaine (EMLA) cream Apply 1 application topically as needed. For pain to left foot 30 g 1  . metoprolol tartrate (LOPRESSOR) 50 MG tablet take 1 and 1/2 tablet by mouth twice a day  0  . NOVOLOG FLEXPEN 100 UNIT/ML FlexPen inject 10 units SUBCUTANEOUSLY WITH MAIN MEAL  0  . potassium chloride SA (K-DUR,KLOR-CON) 20 MEQ tablet Take 20 mEq by mouth daily.   0  . rosuvastatin (CRESTOR) 20 MG tablet   0  .  sertraline (ZOLOFT) 100 MG tablet     . sulfamethoxazole-trimethoprim (BACTRIM) 400-80 MG tablet Take 1 tablet by mouth 2 (two) times daily. 14 tablet 0  . Tedizolid Phosphate 200 MG TABS Take 200 mg by mouth daily. 6 tablet 0   Current Facility-Administered Medications on File Prior to Visit  Medication Dose Route Frequency Provider Last Rate Last Dose  . triamcinolone acetonide (KENALOG) 10 MG/ML injection 10 mg  10 mg Other Once Landis Martins, DPM        Allergies  Allergen Reactions  . Levofloxacin Other (See Comments)    confusion    Objective: There were no vitals filed for this visit.  General: No acute distress, AAOx3  Left foot: There is a full-thickness ulcer present to the left heel that measures 0.8 and depth by 0.5 x 0.6 cm in width circle to the center of the heel with a granular wound bed in no surrounding signs of infection.  Neurovascular unchanged on the left foot.  Previous callus and surgical site at area of fifth metatarsal head resection well-healed on the left. Right foot: Pre-ulcerative callus sub-met 5 and sub-met 1 callus with no signs of infection, neurovascular status intact, prominent metatarsal head with surrounding arthritis.  No other acute findings.   Assessment and Plan:  Problem List Items Addressed This Visit  None    Visit Diagnoses    Cellulitis and abscess of foot, except toes    -  Primary   Foot ulcer with fat layer exposed, left ([place])       Post-operative state       Pre-ulcerative calluses           -Patient seen and evaluated -Culture results reviewed with patient; continue with Bactrim -Chlorhexidine scrub was done to the left heel and applied gauze packing and dry sterile dressing and instructed patient to continue with daily dressings at home consisting of same every day if there is drainage however if there is very little to no drainage may resume every other day schedule -Continue with postoperative shoe on left - Advised  patient to go to the ER or return to office if the wound worsens or if constitutional symptoms are present. -Return to office in 2 weeks for follow-up evaluation or sooner if problems or issues arise.  Landis Martins, DPM

## 2018-02-15 ENCOUNTER — Other Ambulatory Visit: Payer: Self-pay | Admitting: Sports Medicine

## 2018-02-15 DIAGNOSIS — Z170 Estrogen receptor positive status [ER+]: Secondary | ICD-10-CM | POA: Diagnosis not present

## 2018-02-15 DIAGNOSIS — Z23 Encounter for immunization: Secondary | ICD-10-CM | POA: Diagnosis not present

## 2018-02-15 DIAGNOSIS — L02619 Cutaneous abscess of unspecified foot: Secondary | ICD-10-CM

## 2018-02-15 DIAGNOSIS — Z9889 Other specified postprocedural states: Secondary | ICD-10-CM

## 2018-02-15 DIAGNOSIS — Z79811 Long term (current) use of aromatase inhibitors: Secondary | ICD-10-CM | POA: Diagnosis not present

## 2018-02-15 DIAGNOSIS — Z853 Personal history of malignant neoplasm of breast: Secondary | ICD-10-CM | POA: Diagnosis not present

## 2018-02-15 DIAGNOSIS — L97522 Non-pressure chronic ulcer of other part of left foot with fat layer exposed: Secondary | ICD-10-CM

## 2018-02-15 DIAGNOSIS — L03119 Cellulitis of unspecified part of limb: Secondary | ICD-10-CM

## 2018-02-15 DIAGNOSIS — C50919 Malignant neoplasm of unspecified site of unspecified female breast: Secondary | ICD-10-CM | POA: Diagnosis not present

## 2018-02-15 DIAGNOSIS — M79672 Pain in left foot: Principal | ICD-10-CM

## 2018-02-15 DIAGNOSIS — M79671 Pain in right foot: Secondary | ICD-10-CM

## 2018-02-18 ENCOUNTER — Ambulatory Visit (INDEPENDENT_AMBULATORY_CARE_PROVIDER_SITE_OTHER): Payer: Medicare Other | Admitting: Cardiology

## 2018-02-18 ENCOUNTER — Encounter: Payer: Self-pay | Admitting: Cardiology

## 2018-02-18 VITALS — BP 118/60 | HR 80 | Ht 66.0 in | Wt 139.8 lb

## 2018-02-18 DIAGNOSIS — Z5181 Encounter for therapeutic drug level monitoring: Secondary | ICD-10-CM | POA: Diagnosis not present

## 2018-02-18 DIAGNOSIS — I482 Chronic atrial fibrillation, unspecified: Secondary | ICD-10-CM | POA: Diagnosis not present

## 2018-02-18 DIAGNOSIS — Z79899 Other long term (current) drug therapy: Secondary | ICD-10-CM | POA: Diagnosis not present

## 2018-02-18 DIAGNOSIS — Z950 Presence of cardiac pacemaker: Secondary | ICD-10-CM

## 2018-02-18 DIAGNOSIS — Z8673 Personal history of transient ischemic attack (TIA), and cerebral infarction without residual deficits: Secondary | ICD-10-CM

## 2018-02-18 DIAGNOSIS — I10 Essential (primary) hypertension: Secondary | ICD-10-CM

## 2018-02-18 NOTE — Patient Instructions (Signed)
Medication Instructions:  Your physician recommends that you continue on your current medications as directed. Please refer to the Current Medication list given to you today.  If you need a refill on your cardiac medications before your next appointment, please call your pharmacy.   Lab work: Your physician recommends that you have the following labs drawn: BMP and digoxin level today  If you have labs (blood work) drawn today and your tests are completely normal, you will receive your results only by: Marland Kitchen MyChart Message (if you have MyChart) OR . A paper copy in the mail If you have any lab test that is abnormal or we need to change your treatment, we will call you to review the results.  Testing/Procedures: Your physician has requested that you have an echocardiogram. Echocardiography is a painless test that uses sound waves to create images of your heart. It provides your doctor with information about the size and shape of your heart and how well your heart's chambers and valves are working. This procedure takes approximately one hour. There are no restrictions for this procedure.  Follow-Up: At [facility], you and your health needs are our priority.  As part of our continuing mission to provide you with exceptional heart care, we have created designated Provider Care Teams.  These Care Teams include your primary Cardiologist (physician) and Advanced Practice Providers (APPs -  Physician Assistants and Nurse Practitioners) who all work together to provide you with the care you need, when you need it.  You will need a follow up appointment in 3 months.  Please call our office 2 months in advance to schedule this appointment.  You may see another member of our [employer] Provider Team in [place]: Shirlee More, MD . Jyl Heinz, MD  Any Other Special Instructions Will Be Listed Below (If Applicable).

## 2018-02-18 NOTE — Progress Notes (Signed)
Cardiology Office Note:    Date:  02/18/2018   ID:  Cindy Hahn, DOB 12-21-1934, MRN [id number]  PCP:  Ronita Hipps, MD  Cardiologist:  Jenne Campus, MD    Referring MD: Ronita Hipps, MD   Chief Complaint  Patient presents with  . Follow-up  Doing fine  History of Present Illness:    Cindy Hahn is a 83 y.o. female with multiple medical problems.  She does have chronic atrial fibrillation she is not anticoagulated I initiated conversation about watchman device.  She still thinks about it she comes like always to our office with her daughter.  The problem right now is she does have chronic ulceration of the heel that being managed by wound care team.  I do not think we can consider watchman device with active infection.  No chest pain tightness squeezing pressure branches but complained of having dizziness.  Past Medical History:  Diagnosis Date  . A-fib ([place])   . Cancer ([place])   . Dementia ([place])   . Diabetes mellitus without complication ([place])   . Hyperlipidemia   . Hypertension   . Stroke [facility])     Past Surgical History:  Procedure Laterality Date  . ABDOMINAL HYSTERECTOMY    . BLADDER SURGERY    . CATARACT EXTRACTION    . ILEOCECETOMY    . KNEE SURGERY    . OOPHORECTOMY    . SHOULDER SURGERY    . WRIST SURGERY      Current Medications: Current Meds  Medication Sig  . ALPRAZolam (XANAX) 1 MG tablet Take 1 mg by mouth.  Marland Kitchen anastrozole (ARIMIDEX) 1 MG tablet take 1 tablet by mouth once daily to [place].  Marland Kitchen aspirin EC 81 MG tablet Take 81 mg by mouth.  . BD INSULIN SYRINGE U/F 31G X 5/16" 1 ML MISC   . digoxin (LANOXIN) 0.125 MG tablet Take 0.0625 mg by mouth daily.   Marland Kitchen diltiazem (CARDIZEM) 60 MG tablet   . LEVEMIR 100 UNIT/ML injection Inject 70 Units into the skin.   . metoprolol tartrate (LOPRESSOR) 50 MG tablet take 1 and 1/2 tablet by mouth twice a day  . NOVOLOG FLEXPEN 100 UNIT/ML FlexPen inject 10 units  SUBCUTANEOUSLY WITH MAIN MEAL  . potassium chloride SA (K-DUR,KLOR-CON) 20 MEQ tablet Take 20 mEq by mouth daily.   . rosuvastatin (CRESTOR) 20 MG tablet   . sertraline (ZOLOFT) 100 MG tablet    Current Facility-Administered Medications for the 02/18/18 encounter (Office Visit) with Park Liter, MD  Medication  . triamcinolone acetonide (KENALOG) 10 MG/ML injection 10 mg     Allergies:   Levofloxacin   Social History   Socioeconomic History  . Marital status: Widowed    Spouse name: Not on file  . Number of children: Not on file  . Years of education: Not on file  . Highest education level: Not on file  Occupational History  . Not on file  Social Needs  . Financial resource strain: Not on file  . Food insecurity:    Worry: Not on file    Inability: Not on file  . Transportation needs:    Medical: Not on file    Non-medical: Not on file  Tobacco Use  . Smoking status: Former Research scientist (life sciences)  . Smokeless tobacco: Never Used  Substance and Sexual Activity  . Alcohol use: No    Frequency: Never  . Drug use: No  . Sexual activity: Not on file  Lifestyle  . Physical activity:    Days per week: Not on file    Minutes per session: Not on file  . Stress: Not on file  Relationships  . Social connections:    Talks on phone: Not on file    Gets together: Not on file    Attends religious service: Not on file    Active member of club or organization: Not on file    Attends meetings of clubs or organizations: Not on file    Relationship status: Not on file  Other Topics Concern  . Not on file  Social History Narrative  . Not on file     Family History: The patient's family history includes Cancer in her father; Cardiomyopathy in her mother; Diabetes in her mother; Heart disease in her father; Tuberculosis in her mother. ROS:   Please see the history of present illness.    All 14 point review of systems negative except as described per history of present  illness  EKGs/Labs/Other Studies Reviewed:      Recent Labs: No results found for requested labs within last 8760 hours.  Recent Lipid Panel No results found for: CHOL, TRIG, HDL, CHOLHDL, VLDL, LDLCALC, LDLDIRECT  Physical Exam:    VS:  BP 118/60   Pulse 80   Ht 5\' 6"  (1.676 m)   Wt 139 lb 12.8 oz (63.4 kg)   SpO2 97%   BMI 22.56 kg/m     Wt Readings from Last 3 Encounters:  02/18/18 139 lb 12.8 oz (63.4 kg)  09/23/17 133 lb (60.3 kg)  05/27/17 135 lb (61.2 kg)     GEN:  Well nourished, well developed in no acute distress HEENT: Normal NECK: No JVD; No carotid bruits LYMPHATICS: No lymphadenopathy CARDIAC: RRR, no murmurs, no rubs, no gallops RESPIRATORY:  Clear to auscultation without rales, wheezing or rhonchi  ABDOMEN: Soft, non-tender, non-distended MUSCULOSKELETAL:  No edema; No deformity  SKIN: Warm and dry LOWER EXTREMITIES: no swelling NEUROLOGIC:  Alert and oriented x 3 PSYCHIATRIC:  Normal affect   ASSESSMENT:    1. Chronic atrial fibrillation   2. Essential hypertension   3. Encounter for monitoring digoxin therapy   4. History of stroke   5. Pacemaker    PLAN:    In order of problems listed above:  1. Chronic atrial fibrillation rate controlled not anticoagulated discussion as above. 2. Essential hypertension blood pressure well controlled continue present management. 3. Digoxin therapy I will ask her to check the level. 4. Pacemaker will enroll in our pacemaker clinic 5. I will also ask her to have echocardiogram to assess left ventricular ejection fraction.   Medication Adjustments/Labs and Tests Ordered: Current medicines are reviewed at length with the patient today.  Concerns regarding medicines are outlined above.  Orders Placed This Encounter  Procedures  . Digoxin level  . Basic metabolic panel  . ECHOCARDIOGRAM COMPLETE   Medication changes: No orders of the defined types were placed in this encounter.   Signed, Park Liter, MD, [facility] 02/18/2018 4:15 PM    [facility]

## 2018-02-19 LAB — BASIC METABOLIC PANEL
BUN / CREAT RATIO: 22 (ref 12–28)
BUN: 25 mg/dL (ref 8–27)
CO2: 22 mmol/L (ref 20–29)
CREATININE: 1.13 mg/dL — AB (ref 0.57–1.00)
Calcium: 9.5 mg/dL (ref 8.7–10.3)
Chloride: 96 mmol/L (ref 96–106)
GFR calc Af Amer: 53 mL/min/{1.73_m2} — ABNORMAL LOW (ref >59)
GFR calc non Af Amer: 46 mL/min/{1.73_m2} — ABNORMAL LOW (ref >59)
GLUCOSE: 516 mg/dL — AB (ref 65–99)
Potassium: 4.5 mmol/L (ref 3.5–5.2)
SODIUM: 136 mmol/L (ref 134–144)

## 2018-02-19 LAB — DIGOXIN LEVEL: Digoxin, Serum: 0.6 ng/mL (ref 0.5–0.9)

## 2018-02-21 NOTE — Progress Notes (Signed)
Patient ID: Cindy Hahn, female   DOB: Sep 01, 1934, 83 y.o.   MRN: [id number]   Patient presents at Dr Leeanne Rio request to be measured for diabetic shoes and inserts with [facility] Certified Pedorthist.  Patient will be called when shoes and inserts arrive to schedule a fitting.

## 2018-02-22 ENCOUNTER — Telehealth: Payer: Self-pay | Admitting: Emergency Medicine

## 2018-02-22 NOTE — Telephone Encounter (Signed)
Daughter answered phone call. No dpr on file. Daughter will return call patient is awake to give verbal permission

## 2018-02-25 ENCOUNTER — Ambulatory Visit (INDEPENDENT_AMBULATORY_CARE_PROVIDER_SITE_OTHER): Payer: Medicare Other | Admitting: Sports Medicine

## 2018-02-25 ENCOUNTER — Encounter: Payer: Self-pay | Admitting: Sports Medicine

## 2018-02-25 VITALS — BP 105/59 | HR 112 | Temp 97.9°F | Resp 16

## 2018-02-25 DIAGNOSIS — Z9889 Other specified postprocedural states: Secondary | ICD-10-CM | POA: Diagnosis not present

## 2018-02-25 DIAGNOSIS — L03119 Cellulitis of unspecified part of limb: Secondary | ICD-10-CM

## 2018-02-25 DIAGNOSIS — L97522 Non-pressure chronic ulcer of other part of left foot with fat layer exposed: Secondary | ICD-10-CM

## 2018-02-25 DIAGNOSIS — L84 Corns and callosities: Secondary | ICD-10-CM | POA: Diagnosis not present

## 2018-02-25 DIAGNOSIS — M79671 Pain in right foot: Secondary | ICD-10-CM | POA: Diagnosis not present

## 2018-02-25 DIAGNOSIS — L02619 Cutaneous abscess of unspecified foot: Secondary | ICD-10-CM | POA: Diagnosis not present

## 2018-02-25 DIAGNOSIS — M79672 Pain in left foot: Secondary | ICD-10-CM

## 2018-02-25 NOTE — Progress Notes (Signed)
Subjective: Cindy Hahn is a 83 y.o. female patient seen today in office for POV #9 ( DOS 11-25-17), S/P left foot wound debridement x3 and removal of fifth metatarsal head with application of Osiris grafts, Patient admits pain to left heel pain.  Patient denies nausea vomiting fever chills or any other constitutional symptoms at this time.  Completed Bactrim last week.  No other issues noted.   Fasting blood sugar 268 last night last A1c 10.1.  Patient is assisted by daughter this visit.   Patient Active Problem List   Diagnosis Date Noted  . Diabetic polyneuropathy associated with type 2 diabetes mellitus ([place]) 06/04/2015  . Skin ulcer of left heel, limited to breakdown of skin ([place]) 06/04/2015  . Pacemaker 03/11/2015  . Bradycardia 02/06/2015  . Chronic atrial fibrillation 11/01/2014  . Essential hypertension 11/01/2014  . History of stroke 11/01/2014    Current Outpatient Medications on File Prior to Visit  Medication Sig Dispense Refill  . ALPRAZolam (XANAX) 1 MG tablet Take 1 mg by mouth.    Marland Kitchen anastrozole (ARIMIDEX) 1 MG tablet take 1 tablet by mouth once daily to [place].  0  . aspirin EC 81 MG tablet Take 81 mg by mouth.    . BD INSULIN SYRINGE U/F 31G X 5/16" 1 ML MISC     . digoxin (LANOXIN) 0.125 MG tablet Take 0.0625 mg by mouth daily.   0  . diltiazem (CARDIZEM) 60 MG tablet     . LEVEMIR 100 UNIT/ML injection Inject 70 Units into the skin.     . metoprolol tartrate (LOPRESSOR) 50 MG tablet take 1 and 1/2 tablet by mouth twice a day  0  . NOVOLOG FLEXPEN 100 UNIT/ML FlexPen inject 10 units SUBCUTANEOUSLY WITH MAIN MEAL  0  . potassium chloride SA (K-DUR,KLOR-CON) 20 MEQ tablet Take 20 mEq by mouth daily.   0  . rosuvastatin (CRESTOR) 20 MG tablet   0  . sertraline (ZOLOFT) 100 MG tablet      Current Facility-Administered Medications on File Prior to Visit  Medication Dose Route Frequency Provider Last Rate Last Dose  . triamcinolone  acetonide (KENALOG) 10 MG/ML injection 10 mg  10 mg Other Once Landis Martins, DPM        Allergies  Allergen Reactions  . Levofloxacin Other (See Comments)    confusion    Objective: There were no vitals filed for this visit.  General: No acute distress, AAOx3  Left foot: There is a full-thickness ulcer present to the left heel that measures 0.3 and depth by 0.5 x 0.6 cm in width circle to the center of the heel once the keratotic tissue has been debrided with a granular wound bed in no surrounding signs of infection.  Neurovascular unchanged on the left foot.  Previous callus and surgical site at area of fifth metatarsal head resection well-healed on the left. Right foot: Pre-ulcerative callus sub-met 5 and sub-met 1 callus with no signs of infection, neurovascular status intact, prominent metatarsal head with surrounding arthritis.  No other acute findings.   Assessment and Plan:  Problem List Items Addressed This Visit    None    Visit Diagnoses    Foot ulcer with fat layer exposed, left ([place])    -  Primary   Pre-ulcerative calluses       Cellulitis and abscess of foot, except toes       Post-operative state       Bilateral foot pain           -  Patient seen and evaluated -Flushed wound bed with saline and antibiotic impregnated irrigation then mechanically debrided left heel ulceration using a sterile tissue nipper down to the level of subcutaneous fat to healthy bleeding granular tissue with no purulence noted however there was clear to bloody drainage and subcutaneous fat that was expressed when the wound bed margins were pressed the patient tolerated the debridement procedure well without need of anesthesia and hemostasis was achieved with manual pressure then applied 1/4 inch plain packing to the heel and advised daughter to do the same every other day however if there is a moderate amount of drainage to change packing daily -Continue with postoperative shoe on left -Advised  patient to go to the ER or return to office if the wound worsens or if constitutional symptoms are present or call office as well for a refill on antibiotics -Return to office in 2 weeks for follow-up evaluation or sooner if problems or issues arise.  Landis Martins, DPM

## 2018-03-01 ENCOUNTER — Telehealth: Payer: Self-pay | Admitting: *Deleted

## 2018-03-01 ENCOUNTER — Other Ambulatory Visit: Payer: Self-pay | Admitting: Sports Medicine

## 2018-03-01 DIAGNOSIS — L02619 Cutaneous abscess of unspecified foot: Secondary | ICD-10-CM

## 2018-03-01 DIAGNOSIS — E13311 Other specified diabetes mellitus with unspecified diabetic retinopathy with macular edema: Secondary | ICD-10-CM | POA: Diagnosis not present

## 2018-03-01 DIAGNOSIS — L03119 Cellulitis of unspecified part of limb: Principal | ICD-10-CM

## 2018-03-01 MED ORDER — SULFAMETHOXAZOLE-TRIMETHOPRIM 400-80 MG PO TABS: 1.0000 | ORAL_TABLET | Freq: Two times a day (BID) | ORAL | 0 refills | Status: AC

## 2018-03-01 NOTE — Telephone Encounter (Signed)
Patients daughter called stating when she went to change dressing on left heel large amounts of pus came from the site.  She states that the area is red, painful and warm to the touch, she is cold not sure if she is running a temp. Wants to know about an antibiotic being called in and wether she should continue to pack it?    I scheduled patient for an urgent work in tomorrow at 230

## 2018-03-01 NOTE — Progress Notes (Signed)
Sent bactrim to pharmacy apply dry dressing do not pack until I can see her Thanks Dr. Cannon Kettle

## 2018-03-01 NOTE — Progress Notes (Signed)
Called and notified daughter of Dr. Leeanne Rio instructions

## 2018-03-02 ENCOUNTER — Encounter: Payer: Self-pay | Admitting: Sports Medicine

## 2018-03-02 ENCOUNTER — Ambulatory Visit (INDEPENDENT_AMBULATORY_CARE_PROVIDER_SITE_OTHER): Payer: Medicare Other | Admitting: Sports Medicine

## 2018-03-02 VITALS — BP 116/72 | HR 109 | Temp 96.4°F | Resp 16

## 2018-03-02 DIAGNOSIS — E1142 Type 2 diabetes mellitus with diabetic polyneuropathy: Secondary | ICD-10-CM

## 2018-03-02 DIAGNOSIS — L03119 Cellulitis of unspecified part of limb: Secondary | ICD-10-CM

## 2018-03-02 DIAGNOSIS — L97522 Non-pressure chronic ulcer of other part of left foot with fat layer exposed: Secondary | ICD-10-CM | POA: Diagnosis not present

## 2018-03-02 DIAGNOSIS — L02619 Cutaneous abscess of unspecified foot: Secondary | ICD-10-CM

## 2018-03-02 DIAGNOSIS — M79672 Pain in left foot: Secondary | ICD-10-CM

## 2018-03-02 MED ORDER — FLUCONAZOLE 150 MG PO TABS: 150.0000 mg | ORAL_TABLET | Freq: Once | ORAL | 2 refills | Status: AC

## 2018-03-02 MED ORDER — GENTAMICIN SULFATE 0.3 % OP SOLN: OPHTHALMIC | 0 refills | Status: DC

## 2018-03-02 NOTE — Progress Notes (Signed)
Subjective: Cindy Hahn is a 83 y.o. female patient seen today in office for POV #10 ( DOS 11-25-17), S/P left foot wound debridement x3 and removal of fifth metatarsal head with application of Osiris grafts, Patient admits pain to left heel pain and is assisted by her daughter who reports that on Monday there was a lot of drainage and pus redness and warmth coming from the area when she removed the packing states that since she has not noticed any pus and that her mom started on her antibiotic on yesterday.  Patient denies nausea vomiting fever chills or any other constitutional symptoms at this time.  No other issues noted.   Fasting blood sugar not recorded.  Patient Active Problem List   Diagnosis Date Noted  . Diabetic polyneuropathy associated with type 2 diabetes mellitus ([place]) 06/04/2015  . Skin ulcer of left heel, limited to breakdown of skin ([place]) 06/04/2015  . Pacemaker 03/11/2015  . Bradycardia 02/06/2015  . Chronic atrial fibrillation 11/01/2014  . Essential hypertension 11/01/2014  . History of stroke 11/01/2014    Current Outpatient Medications on File Prior to Visit  Medication Sig Dispense Refill  . ALPRAZolam (XANAX) 1 MG tablet Take 1 mg by mouth.    Marland Kitchen anastrozole (ARIMIDEX) 1 MG tablet take 1 tablet by mouth once daily to [place].  0  . aspirin EC 81 MG tablet Take 81 mg by mouth.    . BD INSULIN SYRINGE U/F 31G X 5/16" 1 ML MISC     . digoxin (LANOXIN) 0.125 MG tablet Take 0.0625 mg by mouth daily.   0  . diltiazem (CARDIZEM) 60 MG tablet     . LEVEMIR 100 UNIT/ML injection Inject 70 Units into the skin.     . metoprolol tartrate (LOPRESSOR) 50 MG tablet take 1 and 1/2 tablet by mouth twice a day  0  . NOVOLOG FLEXPEN 100 UNIT/ML FlexPen inject 10 units SUBCUTANEOUSLY WITH MAIN MEAL  0  . potassium chloride SA (K-DUR,KLOR-CON) 20 MEQ tablet Take 20 mEq by mouth daily.   0  . rosuvastatin (CRESTOR) 20 MG tablet   0  . sertraline (ZOLOFT)  100 MG tablet     . sulfamethoxazole-trimethoprim (BACTRIM) 400-80 MG tablet Take 1 tablet by mouth 2 (two) times daily for 14 days. 28 tablet 0   Current Facility-Administered Medications on File Prior to Visit  Medication Dose Route Frequency Provider Last Rate Last Dose  . triamcinolone acetonide (KENALOG) 10 MG/ML injection 10 mg  10 mg Other Once Landis Martins, DPM        Allergies  Allergen Reactions  . Levofloxacin Other (See Comments)    confusion    Objective: There were no vitals filed for this visit.  General: No acute distress, AAOx3  Left foot: There is a full-thickness ulcer present to the left heel that measures 0.8 and depth by 0.5 x 0.1 cm in width circle to the center of the heel that tracks to the 10 o'clock position and probes to bone once the keratotic tissue has been debrided with a granular wound bed in no surrounding signs of infection.  No pus from the open area at the left heel.  Neurovascular unchanged on the left foot.  Previous callus and surgical site at area of fifth metatarsal head resection well-healed on the left. Right foot: Pre-ulcerative callus sub-met 5 and sub-met 1 callus with no signs of infection, neurovascular status intact, prominent metatarsal head with surrounding arthritis.  No other  acute findings.  Assessment and Plan:  Problem List Items Addressed This Visit      Endocrine   Diabetic polyneuropathy associated with type 2 diabetes mellitus ([place])    Other Visit Diagnoses    Cellulitis and abscess of foot, except toes    -  Primary   Relevant Medications   gentamicin (GARAMYCIN) 0.3 % ophthalmic solution   fluconazole (DIFLUCAN) 150 MG tablet   Foot ulcer with fat layer exposed, left ([facility])       Pain of left heel           -Patient seen and evaluated -Previous x-rays reviewed -Flushed wound bed with saline and antibiotic impregnated irrigation then mechanically debrided left heel ulceration using a sterile tissue nipper down to  the level of subcutaneous fat to healthy bleeding granular tissue with no purulence noted however there was clear to bloody drainage expressed when the wound bed margins were pressed; no pus.  The patient tolerated the debridement procedure well without need of anesthesia and hemostasis was achieved with manual pressure then applied gauze packing to the heel and advised daughter to dress the area using gauze however if she notices that the hole is closing up to return to using the packing to the area and gentamicin solution as ordered -Continue with postoperative shoe on left and advised to bring the shoe out with Lysol spray twice a month to prevent any bacteria from the surface of her shoe getting into the wound -Advised patient to go to the ER or return to office if the wound worsens or if constitutional symptoms are present or call office as well for a refill on antibiotics -Return to office in 1 week for follow-up evaluation or sooner if problems or issues arise.  Landis Martins, DPM

## 2018-03-10 ENCOUNTER — Encounter: Payer: Self-pay | Admitting: Sports Medicine

## 2018-03-10 ENCOUNTER — Ambulatory Visit (INDEPENDENT_AMBULATORY_CARE_PROVIDER_SITE_OTHER): Payer: Medicare Other | Admitting: Sports Medicine

## 2018-03-10 VITALS — BP 110/71 | HR 106

## 2018-03-10 DIAGNOSIS — L03119 Cellulitis of unspecified part of limb: Secondary | ICD-10-CM

## 2018-03-10 DIAGNOSIS — E113413 Type 2 diabetes mellitus with severe nonproliferative diabetic retinopathy with macular edema, bilateral: Secondary | ICD-10-CM | POA: Diagnosis not present

## 2018-03-10 DIAGNOSIS — L97522 Non-pressure chronic ulcer of other part of left foot with fat layer exposed: Secondary | ICD-10-CM

## 2018-03-10 DIAGNOSIS — M79672 Pain in left foot: Secondary | ICD-10-CM

## 2018-03-10 DIAGNOSIS — L02619 Cutaneous abscess of unspecified foot: Secondary | ICD-10-CM

## 2018-03-10 DIAGNOSIS — H35371 Puckering of macula, right eye: Secondary | ICD-10-CM | POA: Diagnosis not present

## 2018-03-10 DIAGNOSIS — E1142 Type 2 diabetes mellitus with diabetic polyneuropathy: Secondary | ICD-10-CM

## 2018-03-10 NOTE — Progress Notes (Signed)
Subjective: Cindy Hahn is a 83 y.o. female patient seen today in office for POV #11 ( DOS 11-25-17), S/P left foot wound debridement x3 and removal of fifth metatarsal head with application of Osiris grafts, patient has had a reoccurrence of left heel ulceration and reports that it looks like it is getting smaller they have been packing the area and applying gentamicin dilution and using postop shoe patient does admit to some pain to the lateral side of her heel but otherwise denies nausea vomiting fever chills or shortness of breath.  No other issues noted.   Fasting blood sugar not recorded.  Patient reports her blood sugars are very elevated and is starting to see changes of increased blood sugars on her retinal nerve per her ophthalmologist.   Patient Active Problem List   Diagnosis Date Noted  . Diabetic polyneuropathy associated with type 2 diabetes mellitus ([employer]) 06/04/2015  . Skin ulcer of left heel, limited to breakdown of skin ([place]) 06/04/2015  . Pacemaker 03/11/2015  . Bradycardia 02/06/2015  . Chronic atrial fibrillation 11/01/2014  . Essential hypertension 11/01/2014  . History of stroke 11/01/2014    Current Outpatient Medications on File Prior to Visit  Medication Sig Dispense Refill  . ALPRAZolam (XANAX) 1 MG tablet Take 1 mg by mouth.    Marland Kitchen anastrozole (ARIMIDEX) 1 MG tablet take 1 tablet by mouth once daily to [place].  0  . aspirin EC 81 MG tablet Take 81 mg by mouth.    . BD INSULIN SYRINGE U/F 31G X 5/16" 1 ML MISC     . digoxin (LANOXIN) 0.125 MG tablet Take 0.0625 mg by mouth daily.   0  . diltiazem (CARDIZEM) 60 MG tablet     . gentamicin (GARAMYCIN) 0.3 % ophthalmic solution Apply 1-2 drops to foot daily with dressing changes 5 mL 0  . LEVEMIR 100 UNIT/ML injection Inject 70 Units into the skin.     . metoprolol tartrate (LOPRESSOR) 50 MG tablet take 1 and 1/2 tablet by mouth twice a day  0  . NOVOLOG FLEXPEN 100 UNIT/ML FlexPen inject  10 units SUBCUTANEOUSLY WITH MAIN MEAL  0  . potassium chloride SA (K-DUR,KLOR-CON) 20 MEQ tablet Take 20 mEq by mouth daily.   0  . rosuvastatin (CRESTOR) 20 MG tablet   0  . sertraline (ZOLOFT) 100 MG tablet     . sulfamethoxazole-trimethoprim (BACTRIM) 400-80 MG tablet Take 1 tablet by mouth 2 (two) times daily for 14 days. 28 tablet 0   Current Facility-Administered Medications on File Prior to Visit  Medication Dose Route Frequency Provider Last Rate Last Dose  . triamcinolone acetonide (KENALOG) 10 MG/ML injection 10 mg  10 mg Other Once Landis Martins, DPM        Allergies  Allergen Reactions  . Levofloxacin Other (See Comments)    confusion    Objective: There were no vitals filed for this visit.  General: No acute distress, AAOx3  Left foot: There is a full-thickness ulcer present to the left heel that measures 0.6 depth by 0.3 x 0.1 cm in width circle to the center of the heel that tracks to the 10 o'clock position and probes to bone once the keratotic tissue has been debrided with a granular wound bed, no surrounding signs of infection.  No pus from the open area at the left heel.  There is pain with palpation to the left lateral heel adjacent to but not on at the level of the  ulceration.  Neurovascular unchanged on the left foot.  Previous callus and surgical site at area of fifth metatarsal head resection well-healed on the left. Right foot: Pre-ulcerative callus sub-met 5 and sub-met 1 callus with no signs of infection, neurovascular status intact, prominent metatarsal head with surrounding arthritis.  No other acute findings.  Assessment and Plan:  Problem List Items Addressed This Visit      Endocrine   Diabetic polyneuropathy associated with type 2 diabetes mellitus ([place])    Other Visit Diagnoses    Cellulitis and abscess of foot, except toes    -  Primary   Foot ulcer with fat layer exposed, left ([place])       Pain of left heel           -Patient seen and  evaluated -Cleanse ulceration with wound cleanser then mechanically debrided left heel ulceration using a sterile tissue nipper down to the level of subcutaneous fat to healthy bleeding granular tissue with no purulence.  The patient tolerated the debridement procedure well without need of anesthesia and hemostasis was achieved with manual pressure then applied gauze packing to the heel and advised daughter to dress the area using gauze however if she notices that the hole is closing up to return to using the packing to the area and gentamicin solution as previously ordered -Continue with postoperative shoe on left  -Advised patient to go to the ER or return to office if the wound worsens or if constitutional symptoms are present -Return to office in 2 weeks for follow-up evaluation or sooner if problems or issues arise.  May consider consult to infectious disease if wound continues to re-ulcerate and be a problem.  Landis Martins, DPM

## 2018-03-11 ENCOUNTER — Ambulatory Visit: Payer: Medicare Other | Admitting: Sports Medicine

## 2018-03-24 ENCOUNTER — Ambulatory Visit (INDEPENDENT_AMBULATORY_CARE_PROVIDER_SITE_OTHER): Payer: Medicare Other | Admitting: Sports Medicine

## 2018-03-24 ENCOUNTER — Encounter: Payer: Self-pay | Admitting: Sports Medicine

## 2018-03-24 DIAGNOSIS — M79672 Pain in left foot: Secondary | ICD-10-CM | POA: Diagnosis not present

## 2018-03-24 DIAGNOSIS — M86172 Other acute osteomyelitis, left ankle and foot: Secondary | ICD-10-CM

## 2018-03-24 DIAGNOSIS — L97522 Non-pressure chronic ulcer of other part of left foot with fat layer exposed: Secondary | ICD-10-CM

## 2018-03-24 DIAGNOSIS — L02619 Cutaneous abscess of unspecified foot: Secondary | ICD-10-CM | POA: Diagnosis not present

## 2018-03-24 DIAGNOSIS — E1142 Type 2 diabetes mellitus with diabetic polyneuropathy: Secondary | ICD-10-CM

## 2018-03-24 DIAGNOSIS — L03119 Cellulitis of unspecified part of limb: Secondary | ICD-10-CM

## 2018-03-24 MED ORDER — SULFAMETHOXAZOLE-TRIMETHOPRIM 400-80 MG PO TABS: 1.0000 | ORAL_TABLET | Freq: Two times a day (BID) | ORAL | 0 refills | Status: DC

## 2018-03-24 NOTE — Progress Notes (Signed)
Subjective: RACHEAL MATHURIN is a 83 y.o. female patient seen today in office for POV #12 ( DOS 11-25-17), S/P left foot wound debridement x3 and removal of fifth metatarsal head with application of [place] grafts, patient has had a reoccurrence of left heel ulceration and reports that it is still draining about the same amount of drainage with redness and swelling and states that it is starting to hurt a lot.  Daughter has been helping dress the area with packing and antibiotic solution.  Patient has a blood sugar today of 262 and A1c of 10.  Denies nausea vomiting fever chills or shortness of breath but does admit that she has issues with her vision and is seeing a doctor about that because her diabetes is affecting her eyes.  No other issues noted.   Patient Active Problem List   Diagnosis Date Noted  . Diabetic polyneuropathy associated with type 2 diabetes mellitus ([place]) 06/04/2015  . Skin ulcer of left heel, limited to breakdown of skin ([place]) 06/04/2015  . Pacemaker 03/11/2015  . Bradycardia 02/06/2015  . Chronic atrial fibrillation 11/01/2014  . Essential hypertension 11/01/2014  . History of stroke 11/01/2014    Current Outpatient Medications on File Prior to Visit  Medication Sig Dispense Refill  . ALPRAZolam (XANAX) 1 MG tablet Take 1 mg by mouth.    Marland Kitchen anastrozole (ARIMIDEX) 1 MG tablet take 1 tablet by mouth once daily to [place].  0  . aspirin EC 81 MG tablet Take 81 mg by mouth.    . BD INSULIN SYRINGE U/F 31G X 5/16" 1 ML MISC     . digoxin (LANOXIN) 0.125 MG tablet Take 0.0625 mg by mouth daily.   0  . diltiazem (CARDIZEM) 60 MG tablet     . gentamicin (GARAMYCIN) 0.3 % ophthalmic solution Apply 1-2 drops to foot daily with dressing changes 5 mL 0  . LEVEMIR 100 UNIT/ML injection Inject 70 Units into the skin.     . metoprolol tartrate (LOPRESSOR) 50 MG tablet take 1 and 1/2 tablet by mouth twice a day  0  . NOVOLOG FLEXPEN 100 UNIT/ML FlexPen inject 10  units SUBCUTANEOUSLY WITH MAIN MEAL  0  . potassium chloride SA (K-DUR,KLOR-CON) 20 MEQ tablet Take 20 mEq by mouth daily.   0  . rosuvastatin (CRESTOR) 20 MG tablet   0  . sertraline (ZOLOFT) 100 MG tablet      Current Facility-Administered Medications on File Prior to Visit  Medication Dose Route Frequency Provider Last Rate Last Dose  . triamcinolone acetonide (KENALOG) 10 MG/ML injection 10 mg  10 mg Other Once Landis Martins, DPM        Allergies  Allergen Reactions  . Levofloxacin Other (See Comments)    confusion    Objective: There were no vitals filed for this visit.  General: No acute distress, AAOx3  Left foot: There is a full-thickness ulcer present to the left heel that measures 1 depth x 1 cm in width circle to the center of the heel that tracks to the 2:00 and 10 o'clock position about 1 cm and probes to bone once the keratotic tissue has been debrided with a granular wound bed, no surrounding signs of infection.  No pus from the open area at the left heel.  There is pain with palpation to the left lateral heel adjacent to but not on at the level of the ulceration.  Neurovascular unchanged on the left foot.  Previous callus and surgical  site at area of fifth metatarsal head resection well-healed on the left. Right foot: Pre-ulcerative callus sub-met 5 and sub-met 1 callus with no signs of infection, neurovascular status intact, prominent metatarsal head with surrounding arthritis.  No other acute findings.  Assessment and Plan:  Problem List Items Addressed This Visit      Endocrine   Diabetic polyneuropathy associated with type 2 diabetes mellitus ([place])   Relevant Orders   WOUND CULTURE    Other Visit Diagnoses    Cellulitis and abscess of foot, except toes    -  Primary   Relevant Medications   sulfamethoxazole-trimethoprim (BACTRIM) 400-80 MG tablet   Other Relevant Orders   WOUND CULTURE   Foot ulcer with fat layer exposed, left ([place])       Relevant Orders    WOUND CULTURE   Pain of left heel       Relevant Orders   WOUND CULTURE   Osteomyelitis of ankle or foot, left, acute (HCC)       Relevant Medications   sulfamethoxazole-trimethoprim (BACTRIM) 400-80 MG tablet       -Patient seen and evaluated -Cleansed ulceration with wound cleanser then mechanically debrided left heel ulceration using a sterile tissue nipper down to the level of subcutaneous fat to healthy bleeding granular tissue with no purulence.  The patient tolerated the debridement procedure well without need of anesthesia and hemostasis was achieved with manual pressure then applied gauze packing to the heel and advised daughter to dress the area using gentamicin solution and packing every other day -Wound culture for surveillance obtained due to repeat history of infection -Prophylactically restarted patient on Bactrim -Ordered CT scan of heel to evaluate again for any underlying osteomyelitis -Consult placed to infectious disease for any further recommendations on managing heel infection that keeps being recurrent -Continue with postoperative shoe on left however patient comes office today in her regular shoes -Advised patient to go to the ER or return to office if the wound worsens or if constitutional symptoms are present -Return to office in 2 weeks for follow-up evaluation or sooner if problems or issues arise.  Landis Martins, DPM

## 2018-03-25 ENCOUNTER — Telehealth: Payer: Self-pay | Admitting: *Deleted

## 2018-03-25 DIAGNOSIS — E1142 Type 2 diabetes mellitus with diabetic polyneuropathy: Secondary | ICD-10-CM

## 2018-03-25 DIAGNOSIS — L03119 Cellulitis of unspecified part of limb: Principal | ICD-10-CM

## 2018-03-25 DIAGNOSIS — M86172 Other acute osteomyelitis, left ankle and foot: Secondary | ICD-10-CM

## 2018-03-25 DIAGNOSIS — M79672 Pain in left foot: Secondary | ICD-10-CM

## 2018-03-25 DIAGNOSIS — L97522 Non-pressure chronic ulcer of other part of left foot with fat layer exposed: Secondary | ICD-10-CM

## 2018-03-25 DIAGNOSIS — L02619 Cutaneous abscess of unspecified foot: Secondary | ICD-10-CM

## 2018-03-25 NOTE — Telephone Encounter (Signed)
I informed pt's dtr, Cecille Rubin of the CT appt at [facility] and of the referral to [facility] in [place].

## 2018-03-25 NOTE — Telephone Encounter (Signed)
[facility] schedule pt CT with contrast on 03/29/2018 arrive 10:30am for 11:00am testing. Faxed orders, labs of 02/18/2018 and demographics to [place]. Orders to Gretta Arab, RN for [employer] and faxed to [facility].

## 2018-03-25 NOTE — Telephone Encounter (Signed)
-----   Message from [place], [place] sent at 03/24/2018  3:49 PM EST ----- Regarding: CT and ID consult  Recurrent heel infection wit wound probes to bone past history of MSSA and [place] infectopm  CT R/o osteomyleitis since probes to bone

## 2018-03-29 ENCOUNTER — Telehealth: Payer: Self-pay | Admitting: *Deleted

## 2018-03-29 LAB — WOUND CULTURE: Organism ID, Bacteria: NONE SEEN

## 2018-03-29 NOTE — Telephone Encounter (Signed)
I informed pt's dtr, Cecille Rubin of Dr. Leeanne Rio review of results and orders. Lori asked if Dr. Cannon Kettle wanted pt to still go to the Infectious Disease doctor. I told Cecille Rubin yes.

## 2018-03-29 NOTE — Telephone Encounter (Signed)
-----   Message from Landis Martins, [place] sent at 03/29/2018  7:31 AM EST ----- Please let patient know that culture we did in office last week is negative; No growth. Continue with Bactrim until she has completed it -Dr. Cannon Kettle

## 2018-03-30 DIAGNOSIS — L97522 Non-pressure chronic ulcer of other part of left foot with fat layer exposed: Secondary | ICD-10-CM | POA: Diagnosis not present

## 2018-03-30 DIAGNOSIS — L03116 Cellulitis of left lower limb: Secondary | ICD-10-CM | POA: Diagnosis not present

## 2018-04-01 DIAGNOSIS — E113411 Type 2 diabetes mellitus with severe nonproliferative diabetic retinopathy with macular edema, right eye: Secondary | ICD-10-CM | POA: Diagnosis not present

## 2018-04-01 DIAGNOSIS — H35371 Puckering of macula, right eye: Secondary | ICD-10-CM | POA: Diagnosis not present

## 2018-04-01 DIAGNOSIS — H04123 Dry eye syndrome of bilateral lacrimal glands: Secondary | ICD-10-CM | POA: Diagnosis not present

## 2018-04-01 DIAGNOSIS — E113412 Type 2 diabetes mellitus with severe nonproliferative diabetic retinopathy with macular edema, left eye: Secondary | ICD-10-CM | POA: Diagnosis not present

## 2018-04-04 ENCOUNTER — Encounter: Payer: Medicare Other | Admitting: Cardiology

## 2018-04-06 ENCOUNTER — Telehealth: Payer: Self-pay | Admitting: *Deleted

## 2018-04-06 NOTE — Telephone Encounter (Signed)
I informed pt's dtr, Cecille Rubin of results on 03/29/2018 and the necessity for pt to be seen by Infectious Disease.

## 2018-04-06 NOTE — Telephone Encounter (Signed)
-----   Message from Landis Martins, [place] sent at 04/05/2018  6:01 PM EST ----- Regarding: CT Results CT results negative for bone infection. There was cellulitis that they could see but no bone infection. She still should see ID to see what we should do in regards to recurrent infection in her foot -Dr Cannon Kettle

## 2018-04-07 ENCOUNTER — Ambulatory Visit (INDEPENDENT_AMBULATORY_CARE_PROVIDER_SITE_OTHER): Payer: Medicare Other

## 2018-04-07 DIAGNOSIS — I482 Chronic atrial fibrillation, unspecified: Secondary | ICD-10-CM | POA: Diagnosis not present

## 2018-04-07 DIAGNOSIS — I10 Essential (primary) hypertension: Secondary | ICD-10-CM

## 2018-04-07 NOTE — Progress Notes (Signed)
Complete echocardiogram has been performed.  Jimmy Mana Morison RDCS, RVT 

## 2018-04-08 ENCOUNTER — Encounter: Payer: Self-pay | Admitting: Sports Medicine

## 2018-04-08 ENCOUNTER — Ambulatory Visit (INDEPENDENT_AMBULATORY_CARE_PROVIDER_SITE_OTHER): Payer: Medicare Other | Admitting: Sports Medicine

## 2018-04-08 VITALS — BP 91/49 | HR 80 | Temp 97.3°F | Resp 16

## 2018-04-08 DIAGNOSIS — M79672 Pain in left foot: Secondary | ICD-10-CM

## 2018-04-08 DIAGNOSIS — M869 Osteomyelitis, unspecified: Secondary | ICD-10-CM

## 2018-04-08 DIAGNOSIS — L02619 Cutaneous abscess of unspecified foot: Secondary | ICD-10-CM

## 2018-04-08 DIAGNOSIS — L97522 Non-pressure chronic ulcer of other part of left foot with fat layer exposed: Secondary | ICD-10-CM

## 2018-04-08 DIAGNOSIS — L03119 Cellulitis of unspecified part of limb: Secondary | ICD-10-CM

## 2018-04-08 MED ORDER — NEOMYCIN-POLYMYXIN-HC 3.5-10000-1 OT SOLN: OTIC | 2 refills | Status: DC

## 2018-04-08 NOTE — Progress Notes (Signed)
Subjective: Cindy Hahn is a 83 y.o. female patient seen today in office for follow-up evaluation of chronic left heel ulcer.  Patient reports that the ulcer is looking a little better with a little less drainage however the swelling and redness have improved and is not much or as painful as before.  Patient is finishing Bactrim on Tuesday and is assisted by daughter who does her dressing changes who reports that she noticed some bloody drainage on yesterday and that still sometimes she drains through her gauze and ABD layer.  Daughter reports that her mom still will do what she wants to do at home and is walking around barefoot only with socks on not offering any protection to her left heel.  Daughter reports that her mom had her echo and there was no vegetation or sign of seeding infection that could be related to what is going on with her left heel.  No other issues noted.   Patient Active Problem List   Diagnosis Date Noted  . Diabetic polyneuropathy associated with type 2 diabetes mellitus ([place]) 06/04/2015  . Skin ulcer of left heel, limited to breakdown of skin ([place]) 06/04/2015  . Pacemaker 03/11/2015  . Bradycardia 02/06/2015  . Chronic atrial fibrillation 11/01/2014  . Essential hypertension 11/01/2014  . History of stroke 11/01/2014    Current Outpatient Medications on File Prior to Visit  Medication Sig Dispense Refill  . ALPRAZolam (XANAX) 1 MG tablet Take 1 mg by mouth.    Marland Kitchen anastrozole (ARIMIDEX) 1 MG tablet take 1 tablet by mouth once daily to [place].  0  . aspirin EC 81 MG tablet Take 81 mg by mouth.    . BD INSULIN SYRINGE U/F 31G X 5/16" 1 ML MISC     . digoxin (LANOXIN) 0.125 MG tablet Take 0.0625 mg by mouth daily.   0  . diltiazem (CARDIZEM) 60 MG tablet     . gentamicin (GARAMYCIN) 0.3 % ophthalmic solution Apply 1-2 drops to foot daily with dressing changes 5 mL 0  . LEVEMIR 100 UNIT/ML injection Inject 70 Units into the skin.     .  metoprolol tartrate (LOPRESSOR) 50 MG tablet take 1 and 1/2 tablet by mouth twice a day  0  . NOVOLOG FLEXPEN 100 UNIT/ML FlexPen inject 10 units SUBCUTANEOUSLY WITH MAIN MEAL  0  . potassium chloride SA (K-DUR,KLOR-CON) 20 MEQ tablet Take 20 mEq by mouth daily.   0  . rosuvastatin (CRESTOR) 20 MG tablet   0  . sertraline (ZOLOFT) 100 MG tablet     . sulfamethoxazole-trimethoprim (BACTRIM) 400-80 MG tablet Take 1 tablet by mouth 2 (two) times daily. 28 tablet 0   Current Facility-Administered Medications on File Prior to Visit  Medication Dose Route Frequency Provider Last Rate Last Dose  . triamcinolone acetonide (KENALOG) 10 MG/ML injection 10 mg  10 mg Other Once Landis Martins, DPM        Allergies  Allergen Reactions  . Levofloxacin Other (See Comments)    confusion    Objective: There were no vitals filed for this visit.  General: No acute distress, AAOx3  Left foot: There is a full-thickness ulcer present to the left heel that measures 1 depth x 1 cm in width circle to the center of the heel that tracks to the 2:00 and 10 o'clock position about 1 cm and probes to bone once the keratotic tissue has been debrided like before consistent with clinical osteomyelitis that probe to bone test  is positive however there has been no supporting images or cultures that the note any acute osteomyelitis at this time.  The wound bed is granular, mild soft tissue swelling, no warmth no redness no surrounding signs of infection.  No pus from the open area at the left heel.  There is pain with palpation to the left lateral heel adjacent to but not on at the level of the ulceration.  Neurovascular unchanged on the left foot.  Previous callus and surgical site at area of fifth metatarsal head resection well-healed on the left.  Assessment and Plan:  Problem List Items Addressed This Visit    None    Visit Diagnoses    Foot ulcer with fat layer exposed, left ([place])    -  Primary   Osteomyelitis of  left foot, unspecified type ([place])       Cellulitis and abscess of foot, except toes       Pain of left heel           -Patient seen and evaluated -Cleansed ulceration with wound cleanser then mechanically debrided left heel ulceration using a sterile tissue nipper down to the level of subcutaneous fat to healthy bleeding granular tissue with no purulence.  The patient tolerated the debridement procedure well without need of anesthesia and hemostasis was achieved with manual pressure then applied gauze packing to the heel and advised daughter to dress the area using gentamicin solution and when she runs out may switch over to Corticosporin solution and packing every other day -Finish Bactrim -CT results reviewed which were negative for any bone infection and only supportive of swelling and cellulitis -Previous wound culture most recent was negative -Awaiting  infectious disease opinion for any further recommendations on managing heel infection that keeps being recurrent in the setting of a wound that probes deep consistent with clinical osteomyelitis however no other supporting data to confirm this with other tests that have been previously performed -Continue with postoperative shoe on left however patient comes office today in her regular shoes like previous -Advised patient to go to the ER or return to office if the wound worsens or if constitutional symptoms are present -Return to office in 2-3 weeks for follow-up evaluation or sooner if problems or issues arise.  Landis Martins, DPM

## 2018-04-11 ENCOUNTER — Ambulatory Visit: Payer: Medicare Other | Admitting: Internal Medicine

## 2018-04-18 ENCOUNTER — Encounter: Payer: Self-pay | Admitting: Sports Medicine

## 2018-04-29 ENCOUNTER — Ambulatory Visit (INDEPENDENT_AMBULATORY_CARE_PROVIDER_SITE_OTHER): Payer: Medicare Other | Admitting: Sports Medicine

## 2018-04-29 ENCOUNTER — Encounter: Payer: Self-pay | Admitting: Sports Medicine

## 2018-04-29 VITALS — BP 97/54 | HR 81 | Resp 16

## 2018-04-29 DIAGNOSIS — M869 Osteomyelitis, unspecified: Secondary | ICD-10-CM

## 2018-04-29 DIAGNOSIS — L97522 Non-pressure chronic ulcer of other part of left foot with fat layer exposed: Secondary | ICD-10-CM | POA: Diagnosis not present

## 2018-04-29 DIAGNOSIS — M79672 Pain in left foot: Secondary | ICD-10-CM

## 2018-04-29 DIAGNOSIS — E1142 Type 2 diabetes mellitus with diabetic polyneuropathy: Secondary | ICD-10-CM

## 2018-04-29 NOTE — Progress Notes (Signed)
Subjective: Cindy Hahn is a 84 y.o. female patient seen today in office for follow-up evaluation of chronic left heel ulcer.  Patient thinks less drainage but lots of fatigue but not sure of better.  Daughter is helping with wound care and reports that wound was closing up and wanted to have it checked.  No other issues noted.   Patient Active Problem List   Diagnosis Date Noted  . Diabetic polyneuropathy associated with type 2 diabetes mellitus ([place]) 06/04/2015  . Skin ulcer of left heel, limited to breakdown of skin ([place]) 06/04/2015  . Pacemaker 03/11/2015  . Bradycardia 02/06/2015  . Chronic atrial fibrillation 11/01/2014  . Essential hypertension 11/01/2014  . History of stroke 11/01/2014    Current Outpatient Medications on File Prior to Visit  Medication Sig Dispense Refill  . ALPRAZolam (XANAX) 1 MG tablet Take 1 mg by mouth.    Marland Kitchen anastrozole (ARIMIDEX) 1 MG tablet take 1 tablet by mouth once daily to [place].  0  . aspirin EC 81 MG tablet Take 81 mg by mouth.    . BD INSULIN SYRINGE U/F 31G X 5/16" 1 ML MISC     . digoxin (LANOXIN) 0.125 MG tablet Take 0.0625 mg by mouth daily.   0  . diltiazem (CARDIZEM) 60 MG tablet     . gentamicin (GARAMYCIN) 0.3 % ophthalmic solution Apply 1-2 drops to foot daily with dressing changes 5 mL 0  . LEVEMIR 100 UNIT/ML injection Inject 70 Units into the skin.     . metoprolol tartrate (LOPRESSOR) 50 MG tablet take 1 and 1/2 tablet by mouth twice a day  0  . neomycin-polymyxin-hydrocortisone (CORTISPORIN) OTIC solution 2-4 drops a day to wound with dressing changes 20 mL 2  . NOVOLOG FLEXPEN 100 UNIT/ML FlexPen inject 10 units SUBCUTANEOUSLY WITH MAIN MEAL  0  . potassium chloride SA (K-DUR,KLOR-CON) 20 MEQ tablet Take 20 mEq by mouth daily.   0  . rosuvastatin (CRESTOR) 20 MG tablet   0  . sertraline (ZOLOFT) 100 MG tablet     . sulfamethoxazole-trimethoprim (BACTRIM) 400-80 MG tablet Take 1 tablet by mouth 2  (two) times daily. 28 tablet 0   Current Facility-Administered Medications on File Prior to Visit  Medication Dose Route Frequency Provider Last Rate Last Dose  . triamcinolone acetonide (KENALOG) 10 MG/ML injection 10 mg  10 mg Other Once Landis Martins, DPM        Allergies  Allergen Reactions  . Levofloxacin Other (See Comments)    confusion    Objective: There were no vitals filed for this visit.  General: No acute distress, AAOx3  Left foot: There is a full-thickness ulcer present to the left heel that measures 0.7cm in depth x 0.5cm in width circle to the center of the heel that tracks to the 2:00 and 10 o'clock position about 0.7 cm and probes to bone once the keratotic tissue has been debrided like before consistent with clinical osteomyelitis that probe to bone test is positive however there has been no supporting images or cultures that the note any acute osteomyelitis at this time.  The wound bed is granular, mild soft tissue swelling, no warmth no redness no surrounding signs of infection.  No pus from the open area at the left heel.  There is pain with palpation to the left lateral heel adjacent to but not on at the level of the ulceration.  Neurovascular unchanged on the left foot.  Previous callus and surgical  site at area of fifth metatarsal head resection well-healed on the left.  Assessment and Plan:  Problem List Items Addressed This Visit      Endocrine   Diabetic polyneuropathy associated with type 2 diabetes mellitus ([place])    Other Visit Diagnoses    Foot ulcer with fat layer exposed, left ([place])    -  Primary   Osteomyelitis of left foot, unspecified type ([place])       Pain of left heel           -Patient seen and evaluated -Cleansed ulceration with wound cleanser then mechanically debrided left heel ulceration using a sterile tissue nipper down to the level of subcutaneous fat to healthy bleeding granular tissue with no purulence.  The patient tolerated the  debridement procedure well without need of anesthesia and hemostasis was achieved with manual pressure then applied gauze packing to the heel and advised daughter to dress the area using Corticosporin solution and packing every other day. -Oral antibiotics completed -Awaiting  infectious disease opinion for any further recommendations on managing heel infection that keeps being recurrent in the setting of a wound that probes deep consistent with clinical osteomyelitis however no other supporting data to confirm this with other tests that have been previously performed -Continue with shoes that are comfortable that helps offload the heel -Advised patient to go to the ER or return to office if the wound worsens or if constitutional symptoms are present -Return to office in 3 weeks for follow-up evaluation or sooner if problems or issues arise.  Landis Martins, DPM

## 2018-05-03 DIAGNOSIS — E13311 Other specified diabetes mellitus with unspecified diabetic retinopathy with macular edema: Secondary | ICD-10-CM | POA: Diagnosis not present

## 2018-05-05 ENCOUNTER — Encounter: Payer: Self-pay | Admitting: Internal Medicine

## 2018-05-05 ENCOUNTER — Ambulatory Visit (INDEPENDENT_AMBULATORY_CARE_PROVIDER_SITE_OTHER): Payer: Medicare Other | Admitting: Internal Medicine

## 2018-05-05 DIAGNOSIS — Z8619 Personal history of other infectious and parasitic diseases: Secondary | ICD-10-CM | POA: Diagnosis not present

## 2018-05-05 DIAGNOSIS — L97509 Non-pressure chronic ulcer of other part of unspecified foot with unspecified severity: Secondary | ICD-10-CM

## 2018-05-05 DIAGNOSIS — Z881 Allergy status to other antibiotic agents status: Secondary | ICD-10-CM | POA: Diagnosis not present

## 2018-05-05 DIAGNOSIS — L97429 Non-pressure chronic ulcer of left heel and midfoot with unspecified severity: Secondary | ICD-10-CM

## 2018-05-05 DIAGNOSIS — Z872 Personal history of diseases of the skin and subcutaneous tissue: Secondary | ICD-10-CM | POA: Diagnosis not present

## 2018-05-05 DIAGNOSIS — Z87891 Personal history of nicotine dependence: Secondary | ICD-10-CM | POA: Diagnosis not present

## 2018-05-05 DIAGNOSIS — E11621 Type 2 diabetes mellitus with foot ulcer: Secondary | ICD-10-CM

## 2018-05-05 DIAGNOSIS — E1142 Type 2 diabetes mellitus with diabetic polyneuropathy: Secondary | ICD-10-CM

## 2018-05-05 DIAGNOSIS — F0390 Unspecified dementia without behavioral disturbance: Secondary | ICD-10-CM

## 2018-05-05 DIAGNOSIS — L97422 Non-pressure chronic ulcer of left heel and midfoot with fat layer exposed: Principal | ICD-10-CM

## 2018-05-05 DIAGNOSIS — Z8673 Personal history of transient ischemic attack (TIA), and cerebral infarction without residual deficits: Secondary | ICD-10-CM | POA: Insufficient documentation

## 2018-05-05 NOTE — Progress Notes (Signed)
[facility] for Infect[facility] Disease  Reason for Consult: Chronic diabetic foot ulcer Referring Provider: Dr. Landis Martins  Assessment: She has a chronic left heel ulcer caused by her poorly controlled diabetes, neuropathy and pressure.  She does not have any clinical evidence of infection today but her history is certainly suggest that infection has also been playing a role in preventing complete healing.  Given her age, poor control of her diabetes and inability to offload pressure from the area I am not sure that this wound will ever completely heal.  I have suggested that she stay off of antibiotics for now.  If and when it flares up with signs of more infection I would suggest repeating cultures and coming back to see me.  Plan: 1. Observe off of antibiotics for now 2. Follow-up within 4 weeks.  Patient Active Problem List   Diagnosis Date Noted  . Diabetic foot ulcer ([place]) 05/05/2018    Priority: High  . History of CVA (cerebrovascular accident) 05/05/2018  . Dementia ([place]) 05/05/2018  . Diabetic polyneuropathy associated with type 2 diabetes mellitus ([place]) 06/04/2015  . Pacemaker 03/11/2015  . Bradycardia 02/06/2015  . Chronic atrial fibrillation 11/01/2014  . Essential hypertension 11/01/2014    Patient's Medications  New Prescriptions   No medications on file  Previous Medications   ALPRAZOLAM (XANAX) 1 MG TABLET    Take 1 mg by mouth.   ANASTROZOLE (ARIMIDEX) 1 MG TABLET    take 1 tablet by mouth once daily to [place].   ASPIRIN EC 81 MG TABLET    Take 81 mg by mouth.   BD INSULIN SYRINGE U/F 31G X 5/16" 1 ML MISC       DIGOXIN (LANOXIN) 0.125 MG TABLET    Take 0.0625 mg by mouth daily.    DILTIAZEM (CARDIZEM) 60 MG TABLET       GENTAMICIN (GARAMYCIN) 0.3 % OPHTHALMIC SOLUTION    Apply 1-2 drops to foot daily with dressing changes   LEVEMIR 100 UNIT/ML INJECTION    Inject 70 Units into the skin.    METOPROLOL TARTRATE  (LOPRESSOR) 50 MG TABLET    take 1 and 1/2 tablet by mouth twice a day   NEOMYCIN-POLYMYXIN-HYDROCORTISONE (CORTISPORIN) OTIC SOLUTION    2-4 drops a day to wound with dressing changes   NOVOLOG FLEXPEN 100 UNIT/ML FLEXPEN    inject 10 units SUBCUTANEOUSLY WITH MAIN MEAL   POTASSIUM CHLORIDE SA (K-DUR,KLOR-CON) 20 MEQ TABLET    Take 20 mEq by mouth daily.    ROSUVASTATIN (CRESTOR) 20 MG TABLET       SERTRALINE (ZOLOFT) 100 MG TABLET       SULFAMETHOXAZOLE-TRIMETHOPRIM (BACTRIM) 400-80 MG TABLET    Take 1 tablet by mouth 2 (two) times daily.  Modified Medications   No medications on file  Discontinued Medications   No medications on file    HPI: KENYATTA GLOECKNER is a 84 y.o. female who was referred to me for evaluation of her chronic right heel ulcer.  She is in with her daughter today.  She has a history of diabetes and neuropathy.  Her daughter tells me that her diabetes is not well controlled with a A1c between 9 and 10 recently.  They tell me that this wound began about 3 years ago.  She has been in care with podiatry ever since it started but it has never completely healed.  She has had intermittent she has had intermittent purulent  drainage from the ulcer.  She is also had several bouts of cellulitis of her foot.  I have reviewed records from her pharmacy and it seems that she has been treated with antibiotics on at least 20 occasions in the past 3 years.  She has received courses of amoxicillin, amoxicillin clavulanate, cephalexin, levofloxacin, ciprofloxacin, clindamycin and more recently trimethoprim sulfamethoxazole.  She completed her latest course of trimethoprim sulfamethoxazole about 3 weeks ago.  She has had multiple specimens obtained in the last year and sent for molecular testing for bacteria and culture.  I want variety of organisms have grown including both methicillin sensitive and methicillin-resistant coagulase-negative staph, E. coli, Pseudomonas, Klebsiella, Enterobacter,  enterococcus, Acinetobacter and corynebacterium.  I am quite certain that her wound also harbors anaerobes.  The last culture done on 03/24/2018 showed no organisms on stain and final cultures were negative.  Her wound has been looking fairly good since that time according to her daughter.  Her daughter packed the wound with iodoform gauze daily.  They tell me that the drainage will sometimes become a little less purulent but that it never goes away completely on antibiotics.  She underwent skin grafting but that graft failed.  She had a CT scan last month that did not show any abscess or osteomyelitis.  She says that she walks barefoot most of the time at home.  She is not very active but she does not make an attempt to avoid pressure on her heel.  Her daughter tells me her circulation has been evaluated and is adequate.   Review of Systems: Review of Systems  Constitutional: Negative for chills, diaphoresis and fever.  Gastrointestinal: Negative for abdominal pain, diarrhea, nausea and vomiting.  Musculoskeletal: Negative for joint pain.    Marland Kitchen triamcinolone acetonide  10 mg Other Once    Past Medical History:  Diagnosis Date  . A-fib ([place])   . Cancer ([place])   . Dementia ([place])   . Diabetes mellitus without complication ([place])   . Hyperlipidemia   . Hypertension   . Stroke [facility])     Social History   Tobacco Use  . Smoking status: Former Research scientist (life sciences)  . Smokeless tobacco: Never Used  Substance Use Topics  . Alcohol use: No    Frequency: Never  . Drug use: No    Family History  Problem Relation Age of Onset  . Heart disease Father   . Cancer Father   . Cardiomyopathy Mother   . Diabetes Mother   . Tuberculosis Mother    Allergies  Allergen Reactions  . Levofloxacin Other (See Comments)    confusion    OBJECTIVE: Vitals:   05/05/18 1357  BP: 109/68  Pulse: 86  Temp: 98 F (36.7 C)  Weight: 140 lb (63.5 kg)   Body mass index is 22.6 kg/m.   Physical  Exam Constitutional:      Comments: She is talkative and in no distress.  Her daughter frequently corrects her and answers most of my questions.  Musculoskeletal:     Comments: She has a small ulcer left heel that is just large enough to accept the head of a cotton swab.  It probes about half a centimeter deep.  There is no blood or drainage on the cotton swab when removed.  There is no expressible drainage.  There is no odor.  She has no cellulitis.     Microbiology: No results found for this or any previous visit (from the past 240 hour(s)).  Michel Bickers,  [place] for Infectious [place] [phone] pager   [phone] cell 05/05/2018, 3:56 PM

## 2018-05-13 DIAGNOSIS — R10822 Left upper quadrant rebound abdominal tenderness: Secondary | ICD-10-CM | POA: Diagnosis not present

## 2018-05-13 DIAGNOSIS — F0390 Unspecified dementia without behavioral disturbance: Secondary | ICD-10-CM | POA: Diagnosis not present

## 2018-05-13 DIAGNOSIS — R1012 Left upper quadrant pain: Secondary | ICD-10-CM | POA: Diagnosis not present

## 2018-05-13 DIAGNOSIS — Z6821 Body mass index (BMI) 21.0-21.9, adult: Secondary | ICD-10-CM | POA: Diagnosis not present

## 2018-05-13 DIAGNOSIS — Z8673 Personal history of transient ischemic attack (TIA), and cerebral infarction without residual deficits: Secondary | ICD-10-CM | POA: Diagnosis not present

## 2018-05-19 ENCOUNTER — Ambulatory Visit (INDEPENDENT_AMBULATORY_CARE_PROVIDER_SITE_OTHER): Payer: Medicare Other | Admitting: Sports Medicine

## 2018-05-19 ENCOUNTER — Encounter: Payer: Self-pay | Admitting: Sports Medicine

## 2018-05-19 VITALS — BP 105/56 | HR 89 | Resp 16

## 2018-05-19 DIAGNOSIS — L84 Corns and callosities: Secondary | ICD-10-CM

## 2018-05-19 DIAGNOSIS — M869 Osteomyelitis, unspecified: Secondary | ICD-10-CM

## 2018-05-19 DIAGNOSIS — L97511 Non-pressure chronic ulcer of other part of right foot limited to breakdown of skin: Secondary | ICD-10-CM

## 2018-05-19 DIAGNOSIS — L97522 Non-pressure chronic ulcer of other part of left foot with fat layer exposed: Secondary | ICD-10-CM

## 2018-05-19 DIAGNOSIS — L02619 Cutaneous abscess of unspecified foot: Secondary | ICD-10-CM

## 2018-05-19 DIAGNOSIS — M79672 Pain in left foot: Secondary | ICD-10-CM

## 2018-05-19 DIAGNOSIS — L03119 Cellulitis of unspecified part of limb: Secondary | ICD-10-CM

## 2018-05-19 DIAGNOSIS — M79671 Pain in right foot: Secondary | ICD-10-CM

## 2018-05-19 MED ORDER — GENTAMICIN SULFATE 0.3 % OP SOLN: OPHTHALMIC | 0 refills | Status: DC

## 2018-05-19 MED ORDER — NEOMYCIN-POLYMYXIN-HC 3.5-10000-1 OT SOLN: OTIC | 2 refills | Status: DC

## 2018-05-19 NOTE — Progress Notes (Signed)
Subjective: Cindy Hahn is a 84 y.o. female patient seen today in office for follow-up evaluation of chronic left heel ulcer.  Patient reports that she saw ID.  Daughter is helping with wound care and reports that wound is draining less and is doing ok but now has wounds on right foot as well. Denies nausea, vomiting, fever, and chills.  No other issues noted.   Patient Active Problem List   Diagnosis Date Noted  . Diabetic foot ulcer ([place]) 05/05/2018  . History of CVA (cerebrovascular accident) 05/05/2018  . Dementia ([place]) 05/05/2018  . Diabetic polyneuropathy associated with type 2 diabetes mellitus ([place]) 06/04/2015  . Pacemaker 03/11/2015  . Bradycardia 02/06/2015  . Chronic atrial fibrillation 11/01/2014  . Essential hypertension 11/01/2014    Current Outpatient Medications on File Prior to Visit  Medication Sig Dispense Refill  . ALPRAZolam (XANAX) 1 MG tablet Take 1 mg by mouth.    Marland Kitchen anastrozole (ARIMIDEX) 1 MG tablet take 1 tablet by mouth once daily to [place].  0  . aspirin EC 81 MG tablet Take 81 mg by mouth.    . BD INSULIN SYRINGE U/F 31G X 5/16" 1 ML MISC     . digoxin (LANOXIN) 0.125 MG tablet Take 0.0625 mg by mouth daily.   0  . diltiazem (CARDIZEM) 60 MG tablet     . LEVEMIR 100 UNIT/ML injection Inject 70 Units into the skin.     . metoprolol tartrate (LOPRESSOR) 50 MG tablet take 1 and 1/2 tablet by mouth twice a day  0  . neomycin-polymyxin-hydrocortisone (CORTISPORIN) OTIC solution 2-4 drops a day to wound with dressing changes 20 mL 2  . NOVOLOG FLEXPEN 100 UNIT/ML FlexPen inject 10 units SUBCUTANEOUSLY WITH MAIN MEAL  0  . potassium chloride SA (K-DUR,KLOR-CON) 20 MEQ tablet Take 20 mEq by mouth daily.   0  . rosuvastatin (CRESTOR) 20 MG tablet   0  . sertraline (ZOLOFT) 100 MG tablet     . sulfamethoxazole-trimethoprim (BACTRIM) 400-80 MG tablet Take 1 tablet by mouth 2 (two) times daily. 28 tablet 0   Current  Facility-Administered Medications on File Prior to Visit  Medication Dose Route Frequency Provider Last Rate Last Dose  . triamcinolone acetonide (KENALOG) 10 MG/ML injection 10 mg  10 mg Other Once Landis Martins, DPM        Allergies  Allergen Reactions  . Levofloxacin Other (See Comments)    confusion    Objective: There were no vitals filed for this visit.  General: No acute distress, AAOx3  To right foot 2nd to there is an abrasion and to right sub met 1 partial thickness ulceration measures less than 1 cm with a granular base with no signs of infection, to left foot there is a full-thickness ulcer present to the left heel that measures 0.5cm in depth x 0.5cm in width circle to the center of the heel that tracks to the 2:00 and 10 o'clock position about 0.6 cm and probes to bone once the keratotic tissue has been debrided like before consistent with clinical osteomyelitis that probe to bone test is positive,The wound bed is granular, mild soft tissue swelling, no warmth no redness no surrounding signs of infection.  No pus from the open area at the left heel.  There is pain with palpation to the left lateral heel adjacent to but not on at the level of the ulceration, no pain to right foot ulceration.  Neurovascular unchanged.  Previous callus and  surgical site at area of fifth metatarsal head resection well-healed on the left.  Assessment and Plan:  Problem List Items Addressed This Visit    None    Visit Diagnoses    Foot ulcer with fat layer exposed, left ([place])    -  Primary   Osteomyelitis of left foot, unspecified type ([place])       Pain of left heel       Pre-ulcerative calluses       Right foot ulcer, limited to breakdown of skin ([facility])       Right foot pain       Cellulitis and abscess of foot, except toes       Relevant Medications   gentamicin (GARAMYCIN) 0.3 % ophthalmic solution       -Patient seen and evaluated -Cleansed ulceration with wound cleanser then  mechanically debrided left heel ulceration and right sub met 1 ulcer using a sterile tissue nipper down to the level of subcutaneous fat to healthy bleeding granular tissue with no purulence.  The patient tolerated the debridement procedure well without need of anesthesia and hemostasis was achieved with manual pressure then applied gauze packing to the heel and medihoney to right sub met 1 and to toe abrasion.  Advised daughter to dress the area using Corticosporin solution and packing every other day like before to now all areas. -ID recs reviewed  -Continue with shoes that are comfortable that helps offload the heel -Advised patient to go to the ER or return to office if the wound worsens or if constitutional symptoms are present -Return to office in 3 weeks for follow-up evaluation or sooner if problems or issues arise.  Landis Martins, DPM

## 2018-05-23 ENCOUNTER — Ambulatory Visit: Payer: Medicare Other | Admitting: Cardiology

## 2018-05-24 ENCOUNTER — Encounter: Payer: Self-pay | Admitting: Cardiology

## 2018-05-24 ENCOUNTER — Ambulatory Visit (INDEPENDENT_AMBULATORY_CARE_PROVIDER_SITE_OTHER): Payer: Medicare Other | Admitting: Cardiology

## 2018-05-24 VITALS — BP 136/72 | HR 51 | Ht 66.0 in | Wt 139.0 lb

## 2018-05-24 DIAGNOSIS — Z8673 Personal history of transient ischemic attack (TIA), and cerebral infarction without residual deficits: Secondary | ICD-10-CM

## 2018-05-24 DIAGNOSIS — R001 Bradycardia, unspecified: Secondary | ICD-10-CM

## 2018-05-24 DIAGNOSIS — I482 Chronic atrial fibrillation, unspecified: Secondary | ICD-10-CM

## 2018-05-24 DIAGNOSIS — F0390 Unspecified dementia without behavioral disturbance: Secondary | ICD-10-CM | POA: Diagnosis not present

## 2018-05-24 DIAGNOSIS — I10 Essential (primary) hypertension: Secondary | ICD-10-CM | POA: Diagnosis not present

## 2018-05-24 DIAGNOSIS — Z950 Presence of cardiac pacemaker: Secondary | ICD-10-CM | POA: Diagnosis not present

## 2018-05-24 NOTE — Progress Notes (Signed)
Cardiology Office Note:    Date:  05/24/2018   ID:  Cindy Hahn, DOB Oct 01, 1934, MRN [id number]  PCP:  Ronita Hipps, MD  Cardiologist:  Jenne Campus, MD    Referring MD: Ronita Hipps, MD   Chief Complaint  Patient presents with  . Follow-up  Doing well  History of Present Illness:    Cindy Hahn is a 84 y.o. female with quite complex past medical history which include chronic atrial fibrillation.  Does have a pacemaker which is Medtronic device history of CVA comes today to my office she seems to be cheerful however still have issue with the memory she noted last weekend he had super ball cannot remember who playing who won.  However I found her today quite cheerful and she talks a lot.  Her daughter is with her like all the time overall she is doing well denies have any chest pain tightness squeezing pressure burning chest.  She describes episodes of some chest pain that she got few weeks ago end up going to hospital quite extensive evaluation has been done and nothing has been found.  No palpitations no dizziness no passing out again frequent falls.  She does have chronic ulceration of her legs that are gradually getting better.  Past Medical History:  Diagnosis Date  . A-fib ([place])   . Cancer ([place])   . Dementia ([place])   . Diabetes mellitus without complication ([place])   . Hyperlipidemia   . Hypertension   . Stroke [facility])     Past Surgical History:  Procedure Laterality Date  . ABDOMINAL HYSTERECTOMY    . BLADDER SURGERY    . CATARACT EXTRACTION    . ILEOCECETOMY    . KNEE SURGERY    . OOPHORECTOMY    . SHOULDER SURGERY    . WRIST SURGERY      Current Medications: Current Meds  Medication Sig  . ALPRAZolam (XANAX) 1 MG tablet Take 1 mg by mouth.  Marland Kitchen anastrozole (ARIMIDEX) 1 MG tablet take 1 tablet by mouth once daily to [place].  Marland Kitchen aspirin EC 81 MG tablet Take 81 mg by mouth.  . BD INSULIN SYRINGE U/F 31G X 5/16" 1 ML MISC   .  digoxin (LANOXIN) 0.125 MG tablet Take 0.0625 mg by mouth daily.   Marland Kitchen diltiazem (CARDIZEM) 60 MG tablet   . LEVEMIR 100 UNIT/ML injection Inject 70 Units into the skin.   . metoprolol tartrate (LOPRESSOR) 50 MG tablet take 1 and 1/2 tablet by mouth twice a day  . neomycin-polymyxin-hydrocortisone (CORTISPORIN) OTIC solution 2-4 drops a day to wound with dressing changes  . NOVOLOG FLEXPEN 100 UNIT/ML FlexPen inject 10 units SUBCUTANEOUSLY WITH MAIN MEAL  . potassium chloride SA (K-DUR,KLOR-CON) 20 MEQ tablet Take 20 mEq by mouth daily.   . rosuvastatin (CRESTOR) 20 MG tablet   . sertraline (ZOLOFT) 100 MG tablet    Current Facility-Administered Medications for the 05/24/18 encounter (Office Visit) with Park Liter, MD  Medication  . triamcinolone acetonide (KENALOG) 10 MG/ML injection 10 mg     Allergies:   Levofloxacin   Social History   Socioeconomic History  . Marital status: Widowed    Spouse name: Not on file  . Number of children: Not on file  . Years of education: Not on file  . Highest education level: Not on file  Occupational History  . Not on file  Social Needs  . Financial resource strain: Not on file  .  Food insecurity:    Worry: Not on file    Inability: Not on file  . Transportation needs:    Medical: Not on file    Non-medical: Not on file  Tobacco Use  . Smoking status: Former Research scientist (life sciences)  . Smokeless tobacco: Never Used  Substance and Sexual Activity  . Alcohol use: No    Frequency: Never  . Drug use: No  . Sexual activity: Not on file  Lifestyle  . Physical activity:    Days per week: Not on file    Minutes per session: Not on file  . Stress: Not on file  Relationships  . Social connections:    Talks on phone: Not on file    Gets together: Not on file    Attends religious service: Not on file    Active member of club or organization: Not on file    Attends meetings of clubs or organizations: Not on file    Relationship status: Not on file    Other Topics Concern  . Not on file  Social History Narrative  . Not on file     Family History: The patient's family history includes Cancer in her father; Cardiomyopathy in her mother; Diabetes in her mother; Heart disease in her father; Tuberculosis in her mother. ROS:   Please see the history of present illness.    All 14 point review of systems negative except as described per history of present illness  EKGs/Labs/Other Studies Reviewed:    Echocardiogram from 07 April 2018: Left ventricle: The cavity size was normal. Systolic function was   normal. The estimated ejection fraction was in the range of 50%   to 55%. Wall motion was normal; there were no regional wall   motion abnormalities. - Aortic valve: Trileaflet; mildly thickened, mildly calcified   leaflets. Transvalvular velocity was within the normal range.   There was no stenosis. - Mitral valve: Mildly thickened and calcified leaflets . There was   mild regurgitation. - Left atrium: The atrium was moderately dilated.  Recent Labs: 02/18/2018: BUN 25; Creatinine, Ser 1.13; Potassium 4.5; Sodium 136  Recent Lipid Panel No results found for: CHOL, TRIG, HDL, CHOLHDL, VLDL, LDLCALC, LDLDIRECT  Physical Exam:    VS:  BP 136/72   Pulse (!) 51   Ht 5\' 6"  (1.676 m)   Wt 139 lb (63 kg)   SpO2 97%   BMI 22.44 kg/m     Wt Readings from Last 3 Encounters:  05/24/18 139 lb (63 kg)  05/05/18 140 lb (63.5 kg)  02/18/18 139 lb 12.8 oz (63.4 kg)     GEN:  Well nourished, well developed in no acute distress HEENT: Normal NECK: No JVD; No carotid bruits LYMPHATICS: No lymphadenopathy CARDIAC: RRR, no murmurs, no rubs, no gallops RESPIRATORY:  Clear to auscultation without rales, wheezing or rhonchi  ABDOMEN: Soft, non-tender, non-distended MUSCULOSKELETAL:  No edema; No deformity  SKIN: Warm and dry LOWER EXTREMITIES: no swelling NEUROLOGIC:  Alert and oriented x 3 PSYCHIATRIC:  Normal affect   ASSESSMENT:     1. Chronic atrial fibrillation   2. Essential hypertension   3. Dementia without behavioral disturbance, unspecified dementia type ([place])   4. Pacemaker   5. History of CVA (cerebrovascular accident)   6. Bradycardia    PLAN:    In order of problems listed above:  1. Chronic atrial fibrillation not anticoagulate because of frequent falls.  Rate control I initiated already discussion about watchman device.  Both her and  her daughter are still undecided on top of that with active infection I do not think that is a good idea to proceed.  Therefore, we will continue present management. 2. Essential hypertension blood pressure well controlled continue present management. 3. Dementia noted mild. 4. Pacemaker this is a Medtronic device should be scheduled to see EP team to be established as a patient. 5. Bradycardia.  That has been addressed with pacemaker.   Medication Adjustments/Labs and Tests Ordered: Current medicines are reviewed at length with the patient today.  Concerns regarding medicines are outlined above.  No orders of the defined types were placed in this encounter.  Medication changes: No orders of the defined types were placed in this encounter.   Signed, Park Liter, MD, [facility] 05/24/2018 3:44 PM    [place]

## 2018-05-24 NOTE — Patient Instructions (Signed)
Medication Instructions:  Your physician recommends that you continue on your current medications as directed. Please refer to the Current Medication list given to you today.  If you need a refill on your cardiac medications before your next appointment, please call your pharmacy.   Lab work: None.  If you have labs (blood work) drawn today and your tests are completely normal, you will receive your results only by: . MyChart Message (if you have MyChart) OR . A paper copy in the mail If you have any lab test that is abnormal or we need to change your treatment, we will call you to review the results.  Testing/Procedures: None.    Follow-Up: At [facility], you and your health needs are our priority.  As part of our continuing mission to provide you with exceptional heart care, we have created designated Provider Care Teams.  These Care Teams include your primary Cardiologist (physician) and Advanced Practice Providers (APPs -  Physician Assistants and Nurse Practitioners) who all work together to provide you with the care you need, when you need it. You will need a follow up appointment in 4 months.  Please call our office 2 months in advance to schedule this appointment.  You may see No primary care provider on file. or another member of our [facility] Provider Team in [place]: Brian Munley, MD . Rajan Revankar, MD  Any Other Special Instructions Will Be Listed Below (If Applicable).     

## 2018-05-30 ENCOUNTER — Encounter: Payer: Self-pay | Admitting: Cardiology

## 2018-05-30 ENCOUNTER — Ambulatory Visit (INDEPENDENT_AMBULATORY_CARE_PROVIDER_SITE_OTHER): Payer: Medicare Other | Admitting: Cardiology

## 2018-05-30 VITALS — BP 110/56 | HR 90 | Ht 66.0 in | Wt 138.0 lb

## 2018-05-30 DIAGNOSIS — I4821 Permanent atrial fibrillation: Secondary | ICD-10-CM

## 2018-05-30 MED ORDER — DILTIAZEM HCL 90 MG PO TABS: 90.0000 mg | ORAL_TABLET | Freq: Two times a day (BID) | ORAL | 6 refills | Status: DC

## 2018-05-30 NOTE — Patient Instructions (Addendum)
Medication Instructions:  Your physician has recommended you make the following change in your medication:  1. INCREASE Diltiazem to 90 mg daily  *If you need a refill on your cardiac medications before your next appointment, please call your pharmacy*  Labwork: None ordered  Testing/Procedures: None ordered  Follow-Up: Remote monitoring is used to monitor your Pacemaker or ICD from home. This monitoring reduces the number of office visits required to check your device to one time per year. It allows [place] to keep an eye on the functioning of your device to ensure it is working properly. You are scheduled for a device check from home on 08/29/2018. You may send your transmission at any time that day. If you have a wireless device, the transmission will be sent automatically. After your physician reviews your transmission, you will receive a postcard with your next transmission date.  Your physician wants you to follow-up in: 6 months with Dr. Curt Bears.  You will receive a reminder letter in the mail two months in advance. If you don't receive a letter, please call our office to schedule the follow-up appointment.  Thank you for choosing [facility]!!   Trinidad Curet, RN [phone]  Any Other Special Instructions Will Be Listed Below (If Applicable).  Device clinic #  [id number]

## 2018-05-30 NOTE — Progress Notes (Signed)
Electrophysiology Office Note   Date:  05/30/2018   ID:  Cindy Hahn, Cindy Hahn 22-Jul-1934, MRN [id number]  PCP:  Ronita Hipps, MD  Cardiologist:  Agustin Cree Primary Electrophysiologist:    Meredith Leeds, MD    No chief complaint on file.    History of Present Illness: Cindy Hahn is a 84 y.o. female who is being seen today for the evaluation of pacemaker at the request of Jenne Campus. Presenting today for electrophysiology evaluation.  Has a history of chronic atrial fibrillation, diabetes, hypertension, hyperlipidemia, CVA, and dementia.  She has a Medtronic pacemaker.  Today, she denies symptoms of palpitations, chest pain, shortness of breath, orthopnea, PND, lower extremity edema, claudication, dizziness, presyncope, syncope, bleeding, or neurologic sequela. The patient is tolerating medications without difficulties.    Past Medical History:  Diagnosis Date  . A-fib ([place])   . Cancer ([place])   . Dementia ([place])   . Diabetes mellitus without complication ([place])   . Hyperlipidemia   . Hypertension   . Stroke [facility])    Past Surgical History:  Procedure Laterality Date  . ABDOMINAL HYSTERECTOMY    . BLADDER SURGERY    . CATARACT EXTRACTION    . ILEOCECETOMY    . KNEE SURGERY    . OOPHORECTOMY    . SHOULDER SURGERY    . WRIST SURGERY       Current Outpatient Medications  Medication Sig Dispense Refill  . ALPRAZolam (XANAX) 1 MG tablet Take 1 mg by mouth.    Marland Kitchen anastrozole (ARIMIDEX) 1 MG tablet take 1 tablet by mouth once daily to [place].  0  . aspirin EC 81 MG tablet Take 81 mg by mouth.    . BD INSULIN SYRINGE U/F 31G X 5/16" 1 ML MISC     . digoxin (LANOXIN) 0.125 MG tablet Take 0.0625 mg by mouth daily.   0  . diltiazem (CARDIZEM) 60 MG tablet     . LEVEMIR 100 UNIT/ML injection Inject 70 Units into the skin.     . metoprolol tartrate (LOPRESSOR) 50 MG tablet take 1 and 1/2 tablet by mouth twice a day  0  .  neomycin-polymyxin-hydrocortisone (CORTISPORIN) OTIC solution 2-4 drops a day to wound with dressing changes 20 mL 2  . NOVOLOG FLEXPEN 100 UNIT/ML FlexPen inject 10 units SUBCUTANEOUSLY WITH MAIN MEAL  0  . potassium chloride SA (K-DUR,KLOR-CON) 20 MEQ tablet Take 20 mEq by mouth daily.   0  . rosuvastatin (CRESTOR) 20 MG tablet   0  . sertraline (ZOLOFT) 100 MG tablet      Current Facility-Administered Medications  Medication Dose Route Frequency Provider Last Rate Last Dose  . triamcinolone acetonide (KENALOG) 10 MG/ML injection 10 mg  10 mg Other Once Landis Martins, DPM        Allergies:   Levofloxacin   Social History:  The patient  reports that she has quit smoking. She has never used smokeless tobacco. She reports that she does not drink alcohol or use drugs.   Family History:  The patient's family history includes Cancer in her father; Cardiomyopathy in her mother; Diabetes in her mother; Heart disease in her father; Tuberculosis in her mother.    ROS:  Please see the history of present illness.   Otherwise, review of systems is positive for none.   All other systems are reviewed and negative.    PHYSICAL EXAM: VS:  BP (!) 110/56   Pulse 90   Ht 5'  6" (1.676 m)   Wt 138 lb (62.6 kg)   BMI 22.27 kg/m  , BMI Body mass index is 22.27 kg/m. GEN: Well nourished, well developed, in no acute distress  HEENT: normal  Neck: no JVD, carotid bruits, or masses Cardiac: RRR; no murmurs, rubs, or gallops,no edema  Respiratory:  clear to auscultation bilaterally, normal work of breathing GI: soft, nontender, nondistended, + BS MS: no deformity or atrophy  Skin: warm and dry, device pocket is well healed Neuro:  Strength and sensation are intact Psych: euthymic mood, full affect  EKG:  EKG is ordered today. Personal review of the ekg ordered shows AF, PVC  Device interrogation is reviewed today in detail.  See PaceArt for details.   Recent Labs: 02/18/2018: BUN 25; Creatinine,  Ser 1.13; Potassium 4.5; Sodium 136    Lipid Panel  No results found for: CHOL, TRIG, HDL, CHOLHDL, VLDL, LDLCALC, LDLDIRECT   Wt Readings from Last 3 Encounters:  05/30/18 138 lb (62.6 kg)  05/24/18 139 lb (63 kg)  05/05/18 140 lb (63.5 kg)      Other studies Reviewed: Additional studies/ records that were reviewed today include: TTE 04/07/18  Review of the above records today demonstrates:  - Left ventricle: The cavity size was normal. Systolic function was   normal. The estimated ejection fraction was in the range of 50%   to 55%. Wall motion was normal; there were no regional wall   motion abnormalities. - Aortic valve: Trileaflet; mildly thickened, mildly calcified   leaflets. Transvalvular velocity was within the normal range.   There was no stenosis. - Mitral valve: Mildly thickened and calcified leaflets . There was   mild regurgitation. - Left atrium: The atrium was moderately dilated.   ASSESSMENT AND PLAN:  1.  Chronic atrial fibrillation: Currently not anticoagulated due to frequent falls.  Heart rate histograms show elevated heart rates.   increase diltiazem.  This patients CHA2DS2-VASc Score and unadjusted Ischemic Stroke Rate (% per year) is equal to 9.7 % stroke rate/year from a score of 6  Above score calculated as 1 point each if present [CHF, HTN, DM, Vascular=MI/PAD/Aortic Plaque, Age if 65-74, or Female] Above score calculated as 2 points each if present [Age > 75, or Stroke/TIA/TE]  2.  Hypertension: Well-controlled  3.  Hyperlipidemia: Crestor per primary cardiology  4.  Atrial fibrillation:  Status post Medtronic pacemaker.  Device functioning appropriately.    Current medicines are reviewed at length with the patient today.   The patient does not have concerns regarding her medicines.  The following changes were made today: Diltiazem to 90 mg twice a day  Labs/ tests ordered today include:  Orders Placed This Encounter  Procedures  .  EKG 12-Lead     Disposition:   FU with   6 months  Signed,  Meredith Leeds, MD  05/30/2018 4:28 PM     [place] [address] [place] [address] [phone] (office) [phone] (fax)

## 2018-06-01 ENCOUNTER — Encounter: Payer: Self-pay | Admitting: Cardiology

## 2018-06-02 DIAGNOSIS — Z043 Encounter for examination and observation following other accident: Secondary | ICD-10-CM | POA: Diagnosis not present

## 2018-06-02 DIAGNOSIS — M25511 Pain in right shoulder: Secondary | ICD-10-CM | POA: Diagnosis not present

## 2018-06-02 DIAGNOSIS — M19011 Primary osteoarthritis, right shoulder: Secondary | ICD-10-CM | POA: Diagnosis not present

## 2018-06-05 LAB — CUP PACEART INCLINIC DEVICE CHECK
Battery Impedance: 424 Ohm
Battery Remaining Longevity: 99 mo
Battery Voltage: 2.77 V
Brady Statistic RV Percent Paced: 11 %
Implantable Lead Location: 753860
Implantable Lead Model: 4092
Implantable Pulse Generator Implant Date: 20161027
Lead Channel Impedance Value: 0 Ohm
Lead Channel Impedance Value: 613 Ohm
Lead Channel Pacing Threshold Amplitude: 0.375 V
Lead Channel Pacing Threshold Pulse Width: 0.4 ms
Lead Channel Pacing Threshold Pulse Width: 0.4 ms
Lead Channel Sensing Intrinsic Amplitude: 4 mV
Lead Channel Setting Pacing Amplitude: 2 V
Lead Channel Setting Pacing Pulse Width: 0.4 ms
Lead Channel Setting Sensing Sensitivity: 2 mV
MDC IDC LEAD IMPLANT DT: 20161027
MDC IDC MSMT LEADCHNL RV PACING THRESHOLD AMPLITUDE: 0.5 V
MDC IDC SESS DTM: 20200210212016

## 2018-06-09 ENCOUNTER — Ambulatory Visit: Payer: Medicare Other | Admitting: Sports Medicine

## 2018-06-16 ENCOUNTER — Encounter: Payer: Self-pay | Admitting: Sports Medicine

## 2018-06-16 ENCOUNTER — Ambulatory Visit (INDEPENDENT_AMBULATORY_CARE_PROVIDER_SITE_OTHER): Payer: Medicare Other | Admitting: Sports Medicine

## 2018-06-16 VITALS — BP 100/56 | HR 86 | Temp 96.1°F | Resp 16

## 2018-06-16 DIAGNOSIS — L97522 Non-pressure chronic ulcer of other part of left foot with fat layer exposed: Secondary | ICD-10-CM | POA: Diagnosis not present

## 2018-06-16 DIAGNOSIS — E1142 Type 2 diabetes mellitus with diabetic polyneuropathy: Secondary | ICD-10-CM

## 2018-06-16 DIAGNOSIS — L97511 Non-pressure chronic ulcer of other part of right foot limited to breakdown of skin: Secondary | ICD-10-CM | POA: Diagnosis not present

## 2018-06-16 DIAGNOSIS — M79672 Pain in left foot: Secondary | ICD-10-CM

## 2018-06-16 NOTE — Progress Notes (Signed)
Subjective: Cindy Hahn is a 84 y.o. female patient seen today in office for follow-up evaluation of chronic left heel ulcer and reports a new ulcer to the right foot after she bumped her third and fourth toes against a weight machine a couple of nights ago.  Patient reports that she was walking around and stop and sometimes occasionally picks at her skin but did not come off her sock.  Patient is assisted by daughter who lives with wound care. Denies nausea, vomiting, fever, and chills.  No other issues noted.   Patient Active Problem List   Diagnosis Date Noted  . Diabetic foot ulcer ([place]) 05/05/2018  . History of CVA (cerebrovascular accident) 05/05/2018  . Dementia ([place]) 05/05/2018  . Diabetic polyneuropathy associated with type 2 diabetes mellitus ([place]) 06/04/2015  . Pacemaker 03/11/2015  . Bradycardia 02/06/2015  . Chronic atrial fibrillation 11/01/2014  . Essential hypertension 11/01/2014    Current Outpatient Medications on File Prior to Visit  Medication Sig Dispense Refill  . ALPRAZolam (XANAX) 1 MG tablet Take 1 mg by mouth.    Marland Kitchen anastrozole (ARIMIDEX) 1 MG tablet take 1 tablet by mouth once daily to [place].  0  . aspirin EC 81 MG tablet Take 81 mg by mouth.    . BD INSULIN SYRINGE U/F 31G X 5/16" 1 ML MISC     . digoxin (LANOXIN) 0.125 MG tablet Take 0.0625 mg by mouth daily.   0  . diltiazem (CARDIZEM) 90 MG tablet Take 1 tablet (90 mg total) by mouth 2 (two) times daily. 60 tablet 6  . LEVEMIR 100 UNIT/ML injection Inject 70 Units into the skin.     . metoprolol tartrate (LOPRESSOR) 50 MG tablet take 1 and 1/2 tablet by mouth twice a day  0  . neomycin-polymyxin-hydrocortisone (CORTISPORIN) OTIC solution 2-4 drops a day to wound with dressing changes 20 mL 2  . NOVOLOG FLEXPEN 100 UNIT/ML FlexPen inject 10 units SUBCUTANEOUSLY WITH MAIN MEAL  0  . potassium chloride SA (K-DUR,KLOR-CON) 20 MEQ tablet Take 20 mEq by mouth daily.   0  .  rosuvastatin (CRESTOR) 20 MG tablet   0  . sertraline (ZOLOFT) 100 MG tablet      Current Facility-Administered Medications on File Prior to Visit  Medication Dose Route Frequency Provider Last Rate Last Dose  . triamcinolone acetonide (KENALOG) 10 MG/ML injection 10 mg  10 mg Other Once Landis Martins, DPM        Allergies  Allergen Reactions  . Levofloxacin Other (See Comments)    confusion    Objective: There were no vitals filed for this visit.  General: No acute distress, AAOx3  To right foot third and fourth toes and to right sub met 1 partial thickness ulceration measures less than 1 cm with a granular base with no signs of infection, to left foot there is a full-thickness ulcer present to the left heel that measures 0.4cm in depth x 0.6cm in width circle to the center of the heel that tracks to the 2:00 and 10 o'clock position about 0.3 cm and probes to capsule once the keratotic tissue has been debrided. The wound bed is granular, mild soft tissue swelling, no warmth no redness no surrounding signs of infection.  No pus from the open area at the left heel.  There is pain with palpation to the left lateral heel adjacent to but not on at the level of the ulceration, no pain to right foot ulceration.  Neurovascular unchanged.  Assessment and Plan:  Problem List Items Addressed This Visit      Endocrine   Diabetic polyneuropathy associated with type 2 diabetes mellitus ([place])    Other Visit Diagnoses    Foot ulcer with fat layer exposed, left ([place])    -  Primary   Pain of left heel       Right foot ulcer, limited to breakdown of skin ([place])           -Patient seen and evaluated -Cleansed ulceration with wound cleanser then mechanically debrided left heel ulceration and right sub met 1 ulcer and ulceration to toes 3 and 4 using a sterile tissue nipper down to the level of subcutaneous fat on the left and dermis on right to healthy bleeding granular tissue with no purulence.  The  patient tolerated the debridement procedure well without need of anesthesia and hemostasis was achieved with manual pressure then applied gauze packing to the heel and Silvadene cream to right sub met 1 and toes 3 and 4.  Advised daughter to dress the area using gentamicin solution and packing every other day like before to left heel and to all other areas Silvadene cream -Continue with shoes that are comfortable that helps offload the heel; refrain from walking around home and just socks -Advised patient to go to the ER or return to office if the wound worsens or if constitutional symptoms are present -Return to office in 3-4 weeks for follow-up evaluation or sooner if problems or issues arise.  Landis Martins, DPM

## 2018-06-21 ENCOUNTER — Ambulatory Visit: Payer: Medicare Other | Admitting: Internal Medicine

## 2018-07-07 ENCOUNTER — Other Ambulatory Visit: Payer: Self-pay

## 2018-07-07 ENCOUNTER — Ambulatory Visit: Payer: Medicare Other | Admitting: Sports Medicine

## 2018-07-13 ENCOUNTER — Ambulatory Visit: Payer: Medicare Other | Admitting: Sports Medicine

## 2018-07-14 ENCOUNTER — Encounter: Payer: Self-pay | Admitting: Sports Medicine

## 2018-07-14 ENCOUNTER — Other Ambulatory Visit: Payer: Self-pay

## 2018-07-14 ENCOUNTER — Ambulatory Visit (INDEPENDENT_AMBULATORY_CARE_PROVIDER_SITE_OTHER): Payer: Medicare Other | Admitting: Sports Medicine

## 2018-07-14 VITALS — BP 107/60 | HR 99 | Temp 98.9°F | Resp 16

## 2018-07-14 DIAGNOSIS — L97522 Non-pressure chronic ulcer of other part of left foot with fat layer exposed: Secondary | ICD-10-CM

## 2018-07-14 DIAGNOSIS — L97511 Non-pressure chronic ulcer of other part of right foot limited to breakdown of skin: Secondary | ICD-10-CM

## 2018-07-14 DIAGNOSIS — M79672 Pain in left foot: Secondary | ICD-10-CM

## 2018-07-14 DIAGNOSIS — E1142 Type 2 diabetes mellitus with diabetic polyneuropathy: Secondary | ICD-10-CM

## 2018-07-14 NOTE — Progress Notes (Addendum)
Subjective: Cindy Hahn is a 84 y.o. female patient seen today in office for follow-up evaluation of chronic left heel ulcer and bilateral foot ulcer. Patient is assisted by daughter who lives with wound care of gentamicin to areas. Denies nausea, vomiting, fever, and chills.  No other issues noted.   Patient Active Problem List   Diagnosis Date Noted  . Diabetic foot ulcer ([place]) 05/05/2018  . History of CVA (cerebrovascular accident) 05/05/2018  . Dementia ([place]) 05/05/2018  . Diabetic polyneuropathy associated with type 2 diabetes mellitus ([place]) 06/04/2015  . Pacemaker 03/11/2015  . Bradycardia 02/06/2015  . Chronic atrial fibrillation 11/01/2014  . Essential hypertension 11/01/2014  . Diabetes mellitus ([place]) 11/01/2014    Current Outpatient Medications on File Prior to Visit  Medication Sig Dispense Refill  . ALPRAZolam (XANAX) 1 MG tablet Take 1 mg by mouth.    Marland Kitchen anastrozole (ARIMIDEX) 1 MG tablet take 1 tablet by mouth once daily to [place].  0  . aspirin EC 81 MG tablet Take 81 mg by mouth.    . BD INSULIN SYRINGE U/F 31G X 5/16" 1 ML MISC     . digoxin (LANOXIN) 0.125 MG tablet Take 0.0625 mg by mouth daily.   0  . diltiazem (CARDIZEM) 90 MG tablet Take 1 tablet (90 mg total) by mouth 2 (two) times daily. 60 tablet 6  . fluconazole (DIFLUCAN) 150 MG tablet     . LEVEMIR 100 UNIT/ML injection Inject 70 Units into the skin.     . metoprolol tartrate (LOPRESSOR) 50 MG tablet take 1 and 1/2 tablet by mouth twice a day  0  . neomycin-polymyxin-hydrocortisone (CORTISPORIN) OTIC solution 2-4 drops a day to wound with dressing changes 20 mL 2  . NOVOLOG FLEXPEN 100 UNIT/ML FlexPen inject 10 units SUBCUTANEOUSLY WITH MAIN MEAL  0  . potassium chloride SA (K-DUR,KLOR-CON) 20 MEQ tablet Take 20 mEq by mouth daily.   0  . rosuvastatin (CRESTOR) 20 MG tablet   0  . sertraline (ZOLOFT) 100 MG tablet      Current Facility-Administered Medications on File Prior  to Visit  Medication Dose Route Frequency Provider Last Rate Last Dose  . triamcinolone acetonide (KENALOG) 10 MG/ML injection 10 mg  10 mg Other Once Landis Martins, DPM        Allergies  Allergen Reactions  . Levofloxacin Other (See Comments)    confusion    Objective: There were no vitals filed for this visit.  General: No acute distress, AAOx3  To right foot third and fourth toes and to bilateral sub met 1 partial thickness ulceration measures less than 0.3cm with a granular base with no signs of infection, to left foot there is a full-thickness ulcer present to the left heel that measures 0.3cm in depth x 0.6cm in width circle to the center of the heel that tracks to the 2:00 and 10 o'clock position about 0.3  cm and probes to capsule once the keratotic tissue has been debrided like before. The wound bed is granular, mild soft tissue swelling, no warmth no redness no surrounding signs of infection.  No pus from the open area at the left heel.  There is no pain to left heel, no pain to bilateral foot ulceration.  Neurovascular unchanged.  Assessment and Plan:  Problem List Items Addressed This Visit      Nervous and Auditory   Diabetic polyneuropathy associated with type 2 diabetes mellitus ([place])    Other Visit Diagnoses  Foot ulcer with fat layer exposed, left ([place])    -  Primary   Pain of left heel       Right foot ulcer, limited to breakdown of skin ([place])           -Patient seen and evaluated -Cleansed ulceration with wound cleanser then mechanically debrided left heel ulceration and bilateral, sub met 1 ulcer and ulceration to toes 3 and 4 using a sterile tissue nipper down to the level of subcutaneous fat on the left and dermis on right to healthy bleeding granular tissue with no purulence.  The patient tolerated the debridement procedure well without need of anesthesia and hemostasis was achieved with manual pressure then applied antibiotic cream and guaze and tape to all  areas.  Advised daughter to dress the area using gentamicin solution to all areas every other day -Continue with shoes that are comfortable that helps offload the heel; refrain from walking around home with just socks like before -Advised patient to go to the ER or return to office if the wound worsens or if constitutional symptoms are present -Return to office in 3-4 weeks for follow-up evaluation or sooner if problems or issues arise.  Landis Martins, DPM

## 2018-07-22 ENCOUNTER — Telehealth: Payer: Self-pay

## 2018-07-22 NOTE — Telephone Encounter (Signed)
Patients' daughter phone to inquire about new dosage on her mothers  cardizem prescription. Verified daughter listed on DPR,  Dose was increased from 60 mg twice a day to 90 mg twice a day. Noted medication prescribed by Dr.Caminitz, reviewed [facility] office visit from March, documentation shows dose increased due to continued elevated heart rate. Informed daughter of above, she verbalizes understanding and has no further questions.

## 2018-08-12 ENCOUNTER — Other Ambulatory Visit: Payer: Self-pay | Admitting: Sports Medicine

## 2018-08-12 ENCOUNTER — Encounter: Payer: Self-pay | Admitting: Sports Medicine

## 2018-08-12 ENCOUNTER — Ambulatory Visit (INDEPENDENT_AMBULATORY_CARE_PROVIDER_SITE_OTHER): Payer: Medicare Other | Admitting: Sports Medicine

## 2018-08-12 ENCOUNTER — Other Ambulatory Visit: Payer: Self-pay

## 2018-08-12 VITALS — Temp 97.1°F | Resp 16

## 2018-08-12 DIAGNOSIS — L03119 Cellulitis of unspecified part of limb: Secondary | ICD-10-CM

## 2018-08-12 DIAGNOSIS — L97511 Non-pressure chronic ulcer of other part of right foot limited to breakdown of skin: Secondary | ICD-10-CM | POA: Diagnosis not present

## 2018-08-12 DIAGNOSIS — L97512 Non-pressure chronic ulcer of other part of right foot with fat layer exposed: Secondary | ICD-10-CM

## 2018-08-12 DIAGNOSIS — L84 Corns and callosities: Secondary | ICD-10-CM

## 2018-08-12 DIAGNOSIS — M86179 Other acute osteomyelitis, unspecified ankle and foot: Secondary | ICD-10-CM

## 2018-08-12 DIAGNOSIS — L02619 Cutaneous abscess of unspecified foot: Secondary | ICD-10-CM

## 2018-08-12 DIAGNOSIS — E1142 Type 2 diabetes mellitus with diabetic polyneuropathy: Secondary | ICD-10-CM | POA: Diagnosis not present

## 2018-08-12 DIAGNOSIS — L97522 Non-pressure chronic ulcer of other part of left foot with fat layer exposed: Secondary | ICD-10-CM

## 2018-08-12 MED ORDER — SULFAMETHOXAZOLE-TRIMETHOPRIM 400-80 MG PO TABS: 1.0000 | ORAL_TABLET | Freq: Two times a day (BID) | ORAL | 0 refills | Status: DC

## 2018-08-12 NOTE — Progress Notes (Signed)
Subjective: Cindy Hahn is a 84 y.o. female patient seen today in office for follow-up evaluation of chronic left heel ulcer and bilateral foot ulcers. Patient is assisted by daughter who has been assisting with doing the wound care.  Patient reports that she feels like her wounds are not healing and have been looking worse with more drainage on the right compared to the left with redness daughter has been applying gentamicin to these areas and using a fabric Band-Aid however has been noting more drainage over the last week or 2.  Patient denies nausea, vomiting, fever, and chills but does admit pain on the right greater than left foot especially at the area of ulceration.  No other issues noted.   Patient Active Problem List   Diagnosis Date Noted  . Diabetic foot ulcer ([place]) 05/05/2018  . History of CVA (cerebrovascular accident) 05/05/2018  . Dementia ([place]) 05/05/2018  . Diabetic polyneuropathy associated with type 2 diabetes mellitus ([place]) 06/04/2015  . Pacemaker 03/11/2015  . Bradycardia 02/06/2015  . Chronic atrial fibrillation 11/01/2014  . Essential hypertension 11/01/2014  . Diabetes mellitus ([place]) 11/01/2014    Current Outpatient Medications on File Prior to Visit  Medication Sig Dispense Refill  . ALPRAZolam (XANAX) 1 MG tablet Take 1 mg by mouth.    Marland Kitchen anastrozole (ARIMIDEX) 1 MG tablet take 1 tablet by mouth once daily to [place].  0  . aspirin EC 81 MG tablet Take 81 mg by mouth.    . BD INSULIN SYRINGE U/F 31G X 5/16" 1 ML MISC     . digoxin (LANOXIN) 0.125 MG tablet Take 0.0625 mg by mouth daily.   0  . diltiazem (CARDIZEM) 90 MG tablet Take 1 tablet (90 mg total) by mouth 2 (two) times daily. 60 tablet 6  . fluconazole (DIFLUCAN) 150 MG tablet     . LEVEMIR 100 UNIT/ML injection Inject 70 Units into the skin.     . metoprolol tartrate (LOPRESSOR) 50 MG tablet take 1 and 1/2 tablet by mouth twice a day  0  . neomycin-polymyxin-hydrocortisone  (CORTISPORIN) OTIC solution 2-4 drops a day to wound with dressing changes 20 mL 2  . NOVOLOG FLEXPEN 100 UNIT/ML FlexPen inject 10 units SUBCUTANEOUSLY WITH MAIN MEAL  0  . potassium chloride SA (K-DUR,KLOR-CON) 20 MEQ tablet Take 20 mEq by mouth daily.   0  . rosuvastatin (CRESTOR) 20 MG tablet   0  . sertraline (ZOLOFT) 100 MG tablet      Current Facility-Administered Medications on File Prior to Visit  Medication Dose Route Frequency Provider Last Rate Last Dose  . triamcinolone acetonide (KENALOG) 10 MG/ML injection 10 mg  10 mg Other Once Landis Martins, DPM        Allergies  Allergen Reactions  . Levofloxacin Other (See Comments)    confusion    Objective: There were no vitals filed for this visit.  General: No acute distress, AAOx3  To right foot third and fourth toes there are prematurely healed ulcerations with surrounding redness of the toes.  There are full-thickness ulcerations bilateral sub met 1 that measures 0.8 x 0.6 cm with a granular base with significant surrounding macerated skin, the ulcerations do not probe to the joint, to left foot there is a full-thickness ulcer present to the left heel that measures 0.5 in width by 0.9cm in length by 1 cm probes to bone.  The left heel wound bed is granular with significant periwound maceration, mild soft tissue swelling,  no warmth no redness no surrounding signs of infection.  No pus from the open area at the left heel.  There is no pain to left heel, however there is pain with palpation sub-met 1 on right.  Neurovascular unchanged from prior.  Assessment and Plan:  Problem List Items Addressed This Visit      Nervous and Auditory   Diabetic polyneuropathy associated with type 2 diabetes mellitus ([place])    Other Visit Diagnoses    Acute osteomyelitis of foot ([place])    -  Primary   L heel   Relevant Medications   sulfamethoxazole-trimethoprim (BACTRIM) 400-80 MG tablet   Other Relevant Orders   WOUND CULTURE   Dermatology  pathology   WOUND CULTURE   WOUND CULTURE   Foot ulcer with fat layer exposed, left ([place])       Relevant Orders   WOUND CULTURE   Dermatology pathology   WOUND CULTURE   WOUND CULTURE   Right foot ulcer, with fat layer exposed ([place])       Relevant Orders   WOUND CULTURE   Dermatology pathology   WOUND CULTURE   WOUND CULTURE   Cellulitis and abscess of foot, except toes       Pre-ulcerative calluses       Relevant Orders   WOUND CULTURE   Dermatology pathology   WOUND CULTURE   WOUND CULTURE       -Patient seen and evaluated -Cleansed ulceration with wound cleanser then mechanically debrided left heel ulceration and bilateral, sub met 1 ulcers down to the level of subcutaneous fat to healthy bleeding granular tissue with no purulence.  The patient tolerated the debridement procedure well without need of anesthesia and then at the exposed area of bone took a small tissue nipper and took a small sample through the wound bed of the bone at the left heel and sent the specimen to pathology.  Also took swab cultures from all wound beds and sent those to microbiology to help with guiding antibiotic therapy.  Hemostasis was achieved with manual pressure then applied Betadine and guaze and Coban 2 all areas.  Advised daughter to dress the area using the same every day until the drainage decreases and then may switch to every other day -Prescribed Bactrim for patient to take meanwhile until her culture results are available -Continue with shoes that are comfortable that helps offload the heel; added additional offloading heel padding to her left shoe -Advised patient to go to the ER or return to office if the wound worsens or if constitutional symptoms are present -Return to office in 2 weeks for follow-up evaluation or sooner if problems or issues arise.  We will further discuss with patient other treatment options based on culture results patient may benefit from a debridement and removal of  bone at these areas if wound fails to continue to heal.  Landis Martins, DPM

## 2018-08-15 ENCOUNTER — Telehealth: Payer: Self-pay | Admitting: *Deleted

## 2018-08-15 LAB — WOUND CULTURE

## 2018-08-15 NOTE — Telephone Encounter (Signed)
Called and spoke with [facility] rep explained difficulties with Epic when trying to create the requisition she stated that she would get it fixed and have the results to [place] as soon as possible.

## 2018-08-15 NOTE — Telephone Encounter (Signed)
[place] states a pathology specimen was received, but not on their requisition form, and they are calling to confirm the specimen was meant to be sent to them. Helene Kelp states they are holding the specimen, and would like a call [phone] for Reference: [id number].

## 2018-08-17 LAB — WOUND CULTURE

## 2018-08-17 LAB — PATHOLOGY REPORT

## 2018-08-18 ENCOUNTER — Ambulatory Visit: Payer: Medicare Other | Admitting: Sports Medicine

## 2018-08-19 ENCOUNTER — Telehealth: Payer: Self-pay | Admitting: *Deleted

## 2018-08-19 NOTE — Telephone Encounter (Signed)
-----   Message from Landis Martins, [place] sent at 08/17/2018  5:48 PM EDT ----- Regarding: Culture results Melody will you let patient's daughter know that cultures did come back positive for bacteria on swab and bacteria in bone that is consistent with Acute bone infection/osteomyelitis (which means that is a new infection and has not always been there in the bone or at-least not to the extent to cause any pathologic changes like we see now on the sample that was sent. Her treatment options since the infection is in the heel bone is conservative with antibiotics for about 6 weeks. At this point it is a good idea to get the opinion of Infectious disease to see what they recommend since we have updated cultures for them to go by. The Bactrim that she is currently on covers the bacteria on the swab that grew out but we need to see if infectious disease has any other recommendations for the deeper infection in the bone. Meanwhile the daughter should call for an appointment with Infectious disease and we will fax over the request to have her seen as well as her culture results to: Dr. Michel Bickers, [place] for Infectious Disease [facility] Thanks Dr. Cannon Kettle

## 2018-08-19 NOTE — Telephone Encounter (Signed)
Called and spoke with patients daughter read to her Dr Leeanne Rio message on the culture results.  Gave Her Dr Hale Bogus phone number to schedule an appointment.  Patient requested clarification that she should continue doing as Dr Cannon Kettle instructed at last visit until they see infectious disease.  I told her yes continue current wound care and antibiotic given at last visit.  Patient stated she would call Dr Megan Salon today for an appointment.

## 2018-08-24 ENCOUNTER — Ambulatory Visit (INDEPENDENT_AMBULATORY_CARE_PROVIDER_SITE_OTHER): Payer: Medicare Other | Admitting: Sports Medicine

## 2018-08-24 ENCOUNTER — Other Ambulatory Visit: Payer: Self-pay

## 2018-08-24 ENCOUNTER — Encounter: Payer: Self-pay | Admitting: Sports Medicine

## 2018-08-24 VITALS — BP 110/68 | HR 90 | Temp 97.5°F | Resp 16

## 2018-08-24 DIAGNOSIS — E1142 Type 2 diabetes mellitus with diabetic polyneuropathy: Secondary | ICD-10-CM

## 2018-08-24 DIAGNOSIS — L97512 Non-pressure chronic ulcer of other part of right foot with fat layer exposed: Secondary | ICD-10-CM

## 2018-08-24 DIAGNOSIS — L97522 Non-pressure chronic ulcer of other part of left foot with fat layer exposed: Secondary | ICD-10-CM | POA: Diagnosis not present

## 2018-08-24 DIAGNOSIS — L02619 Cutaneous abscess of unspecified foot: Secondary | ICD-10-CM

## 2018-08-24 DIAGNOSIS — M86179 Other acute osteomyelitis, unspecified ankle and foot: Secondary | ICD-10-CM

## 2018-08-24 DIAGNOSIS — L03119 Cellulitis of unspecified part of limb: Secondary | ICD-10-CM

## 2018-08-24 NOTE — Progress Notes (Signed)
Subjective: Cindy Hahn is a 84 y.o. female patient seen today in office for follow-up evaluation of chronic left heel ulcer and bilateral foot ulcers. Patient is assisted by daughter who has been assisting with doing the wound care who reports that the wounds look about the same in size and form no major changes.  Daughter has been using Betadine and gentamicin to the areas and gauze with Band-Aid.  Patient reports that she feels like her wounds are very sore.  Patient is taking Bactrim antibiotic with no issues.  Patient denies nausea, vomiting, fever, and chills but does admit pain on the right greater than left foot especially at the area of ulceration at the first metatarsophalangeal joint.  No other issues noted.   Fasting blood sugar in the 130s last night  Patient Active Problem List   Diagnosis Date Noted  . Diabetic foot ulcer ([place]) 05/05/2018  . History of CVA (cerebrovascular accident) 05/05/2018  . Dementia ([place]) 05/05/2018  . Diabetic polyneuropathy associated with type 2 diabetes mellitus ([place]) 06/04/2015  . Pacemaker 03/11/2015  . Bradycardia 02/06/2015  . Chronic atrial fibrillation 11/01/2014  . Essential hypertension 11/01/2014  . Diabetes mellitus ([place]) 11/01/2014    Current Outpatient Medications on File Prior to Visit  Medication Sig Dispense Refill  . ALPRAZolam (XANAX) 1 MG tablet Take 1 mg by mouth.    Marland Kitchen anastrozole (ARIMIDEX) 1 MG tablet take 1 tablet by mouth once daily to [place].  0  . aspirin EC 81 MG tablet Take 81 mg by mouth.    . BD INSULIN SYRINGE U/F 31G X 5/16" 1 ML MISC     . digoxin (LANOXIN) 0.125 MG tablet Take 0.0625 mg by mouth daily.   0  . diltiazem (CARDIZEM) 90 MG tablet Take 1 tablet (90 mg total) by mouth 2 (two) times daily. 60 tablet 6  . fluconazole (DIFLUCAN) 150 MG tablet     . LEVEMIR 100 UNIT/ML injection Inject 70 Units into the skin.     . metoprolol tartrate (LOPRESSOR) 50 MG tablet take 1 and 1/2  tablet by mouth twice a day  0  . neomycin-polymyxin-hydrocortisone (CORTISPORIN) OTIC solution 2-4 drops a day to wound with dressing changes 20 mL 2  . NOVOLOG FLEXPEN 100 UNIT/ML FlexPen inject 10 units SUBCUTANEOUSLY WITH MAIN MEAL  0  . potassium chloride SA (K-DUR,KLOR-CON) 20 MEQ tablet Take 20 mEq by mouth daily.   0  . rosuvastatin (CRESTOR) 20 MG tablet   0  . sertraline (ZOLOFT) 100 MG tablet     . sulfamethoxazole-trimethoprim (BACTRIM) 400-80 MG tablet Take 1 tablet by mouth 2 (two) times daily. 28 tablet 0   Current Facility-Administered Medications on File Prior to Visit  Medication Dose Route Frequency Provider Last Rate Last Dose  . triamcinolone acetonide (KENALOG) 10 MG/ML injection 10 mg  10 mg Other Once Landis Martins, DPM        Allergies  Allergen Reactions  . Levofloxacin Other (See Comments)    confusion    Objective: There were no vitals filed for this visit.  General: No acute distress, AAOx3  To right foot third and fourth toes there are prematurely healed ulcerations with surrounding redness of the toes.  There are full-thickness ulcerations bilateral sub met 1 that measures 0.5 x 0.6 cm smaller than last visit with a granular base with significant surrounding macerated skin, the ulcerations do not probe to the joint, to left foot there is a full-thickness ulcer present  to the left heel that measures 0.5 in width by 0.9cm in length by 0.8 cm probes to bone.  The left heel wound bed is granular with significant periwound maceration, mild soft tissue swelling, no warmth no redness no surrounding signs of infection.  No pus from the open area at the left heel.  There is no pain to left heel, however there is pain with palpation sub-met 1 on right like previous.  Neurovascular unchanged from prior.  Assessment and Plan:  Problem List Items Addressed This Visit      Nervous and Auditory   Diabetic polyneuropathy associated with type 2 diabetes mellitus ([place])     Other Visit Diagnoses    Foot ulcer with fat layer exposed, left ([place])    -  Primary   Acute osteomyelitis of foot ([place])       Right foot ulcer, with fat layer exposed ([place])       Cellulitis and abscess of foot, except toes           -Patient seen and evaluated -Cleansed ulceration with wound cleanser then mechanically debrided left heel ulceration and bilateral, sub met 1 ulcers down to the level of subcutaneous fat to healthy bleeding granular tissue with no purulence.  Hemostasis was achieved with manual pressure then applied Betadine and guaze and Coban 2 all areas.  Advised daughter to dress the area using the same every day until the drainage decreases and then may switch to every other day -Awaiting ID appointment which is scheduled for next week -Continue with Bactrim -Continue with shoes that are comfortable that helps offload the wounds -Advised patient to go to the ER or return to office if the wound worsens or if constitutional symptoms are present -Return to office in 2 weeks for follow-up evaluation or sooner if problems or issues arise.    Landis Martins, DPM

## 2018-08-25 ENCOUNTER — Encounter: Payer: Self-pay | Admitting: Sports Medicine

## 2018-08-29 ENCOUNTER — Other Ambulatory Visit: Payer: Self-pay

## 2018-08-29 ENCOUNTER — Encounter: Payer: Medicare Other | Admitting: *Deleted

## 2018-08-30 ENCOUNTER — Ambulatory Visit (INDEPENDENT_AMBULATORY_CARE_PROVIDER_SITE_OTHER): Payer: Medicare Other | Admitting: Internal Medicine

## 2018-08-30 ENCOUNTER — Encounter: Payer: Self-pay | Admitting: Internal Medicine

## 2018-08-30 ENCOUNTER — Ambulatory Visit
Admission: RE | Admit: 2018-08-30 | Discharge: 2018-08-30 | Disposition: A | Discharge status: Home / Self Care | Payer: Medicare Other | Source: Ambulatory Visit | Attending: Internal Medicine | Admitting: Internal Medicine

## 2018-08-30 ENCOUNTER — Other Ambulatory Visit: Payer: Self-pay

## 2018-08-30 DIAGNOSIS — L97403 Non-pressure chronic ulcer of unspecified heel and midfoot with necrosis of muscle: Secondary | ICD-10-CM | POA: Diagnosis not present

## 2018-08-30 DIAGNOSIS — E08621 Diabetes mellitus due to underlying condition with foot ulcer: Secondary | ICD-10-CM | POA: Diagnosis not present

## 2018-08-30 MED ORDER — AMOXICILLIN-POT CLAVULANATE 500-125 MG PO TABS: 1.0000 | ORAL_TABLET | Freq: Two times a day (BID) | ORAL | 1 refills | Status: DC

## 2018-08-30 NOTE — Assessment & Plan Note (Signed)
She has developed acute left calcaneal osteomyelitis complicating her chronic plantar ulcer.  She is not a good candidate for outpatient IV antibiotics for a variety of reasons.  I recommend obtaining an x-ray and lab work today then treating with a 6-week course of oral amoxicillin clavulanate.  They are in agreement with that plan.

## 2018-08-30 NOTE — Progress Notes (Signed)
[facility]  Patient Active Problem List   Diagnosis Date Noted  . Diabetic foot ulcer ([place]) 05/05/2018    Priority: High  . History of CVA (cerebrovascular accident) 05/05/2018  . Dementia ([place]) 05/05/2018  . Diabetic polyneuropathy associated with type 2 diabetes mellitus ([place]) 06/04/2015  . Pacemaker 03/11/2015  . Bradycardia 02/06/2015  . Chronic atrial fibrillation 11/01/2014  . Essential hypertension 11/01/2014  . Diabetes mellitus ([place]) 11/01/2014    Patient's Medications  New Prescriptions   AMOXICILLIN-CLAVULANATE (AUGMENTIN) 500-125 MG TABLET    Take 1 tablet (500 mg total) by mouth 2 (two) times daily.  Previous Medications   ALPRAZOLAM (XANAX) 1 MG TABLET    Take 1 mg by mouth.   ANASTROZOLE (ARIMIDEX) 1 MG TABLET    take 1 tablet by mouth once daily to [place].   ASPIRIN EC 81 MG TABLET    Take 81 mg by mouth.   BD INSULIN SYRINGE U/F 31G X 5/16" 1 ML MISC       DIGOXIN (LANOXIN) 0.125 MG TABLET    Take 0.0625 mg by mouth daily.    DILTIAZEM (CARDIZEM) 90 MG TABLET    Take 1 tablet (90 mg total) by mouth 2 (two) times daily.   FLUCONAZOLE (DIFLUCAN) 150 MG TABLET       LEVEMIR 100 UNIT/ML INJECTION    Inject 70 Units into the skin.    METOPROLOL TARTRATE (LOPRESSOR) 50 MG TABLET    take 1 and 1/2 tablet by mouth twice a day   NEOMYCIN-POLYMYXIN-HYDROCORTISONE (CORTISPORIN) OTIC SOLUTION    2-4 drops a day to wound with dressing changes   NOVOLOG FLEXPEN 100 UNIT/ML FLEXPEN    inject 10 units SUBCUTANEOUSLY WITH MAIN MEAL   POTASSIUM CHLORIDE SA (K-DUR,KLOR-CON) 20 MEQ TABLET    Take 20 mEq by mouth daily.    ROSUVASTATIN (CRESTOR) 20 MG TABLET       SERTRALINE (ZOLOFT) 100 MG TABLET       SULFAMETHOXAZOLE-TRIMETHOPRIM (BACTRIM) 400-80 MG TABLET    Take 1 tablet by mouth 2 (two) times daily.  Modified Medications   No medications on file  Discontinued Medications   No medications on file     Subjective: Cindy Hahn is in with her daughter for a follow-up visit.  She has chronic ulcerations on both feet that have been there for several years.  She has had a particularly stubborn ulcer on the plantar surface of her left foot beneath her calcaneus.  She recently developed increased drainage and underwent a biopsy that showed acute osteomyelitis on pathologic examination.  Cultures grew mixed skin flora with Klebsiella oxytoca susceptible to most antibiotics tested.  He was placed on a 10-day course of oral trimethoprim sulfamethoxazole.  She and her daughter thinks that the drainage may have improved slightly.  Review of Systems: Review of Systems  Constitutional: Negative for chills, diaphoresis and fever.  Gastrointestinal: Negative for abdominal pain, diarrhea, nausea and vomiting.  Musculoskeletal: Positive for joint pain.    Past Medical History:  Diagnosis Date  . A-fib ([place])   . Cancer ([place])   . Dementia ([place])   . Diabetes mellitus without complication ([place])   . Hyperlipidemia   . Hypertension   . Stroke [facility])     Social History   Tobacco Use  . Smoking status: Former Research scientist (life sciences)  . Smokeless tobacco: Never Used  Substance Use Topics  . Alcohol use: No  Frequency: Never  . Drug use: No    Family History  Problem Relation Age of Onset  . Heart disease Father   . Cancer Father   . Cardiomyopathy Mother   . Diabetes Mother   . Tuberculosis Mother     Allergies  Allergen Reactions  . Levofloxacin Other (See Comments)    confusion    Objective: Vitals:   08/30/18 1040  BP: (!) 141/83  Pulse: (!) 106  Temp: 98 F (36.7 C)  Weight: 136 lb (61.7 kg)  Height: 5\' 5"  (1.651 m)   Body mass index is 22.63 kg/m.  Physical Exam Constitutional:      Comments: She is pleasant and in no distress.  She is accompanied by her daughter who frequently must answer questions for her because of her hearing loss and early dementia.  Musculoskeletal:     Comments: She  has about a 10 mm ulceration on the plantar surface of her left foot beneath her heel.  There is a scant amount of yellow-brown drainage gauze dressing.  There is no odor.       Lab Results    Problem List Items Addressed This Visit      High   Diabetic foot ulcer ([place])    She has developed acute left calcaneal osteomyelitis complicating her chronic plantar ulcer.  She is not a good candidate for outpatient IV antibiotics for a variety of reasons.  I recommend obtaining an x-ray and lab work today then treating with a 6-week course of oral amoxicillin clavulanate.  They are in agreement with that plan.      Relevant Medications   amoxicillin-clavulanate (AUGMENTIN) 500-125 MG tablet   Other Relevant Orders   CBC   Basic metabolic panel   C-reactive protein   Sedimentation rate   DG Foot Complete Left       Michel Bickers, MD [facility] [phone] pager   [phone] cell 08/30/2018, 11:36 AM

## 2018-08-31 ENCOUNTER — Telehealth: Payer: Self-pay | Admitting: Cardiology

## 2018-08-31 LAB — CBC
HCT: 44 % (ref 35.0–45.0)
Hemoglobin: 14 g/dL (ref 11.7–15.5)
MCH: 27.5 pg (ref 27.0–33.0)
MCHC: 31.8 g/dL — ABNORMAL LOW (ref 32.0–36.0)
MCV: 86.4 fL (ref 80.0–100.0)
MPV: 12 fL (ref 7.5–12.5)
Platelets: 173 10*3/uL (ref 140–400)
RBC: 5.09 10*6/uL (ref 3.80–5.10)
RDW: 13.7 % (ref 11.0–15.0)
WBC: 8.8 10*3/uL (ref 3.8–10.8)

## 2018-08-31 LAB — BASIC METABOLIC PANEL
BUN/Creatinine Ratio: 19 (calc) (ref 6–22)
BUN: 27 mg/dL — ABNORMAL HIGH (ref 7–25)
CO2: 22 mmol/L (ref 20–32)
Calcium: 9.7 mg/dL (ref 8.6–10.4)
Chloride: 100 mmol/L (ref 98–110)
Creat: 1.41 mg/dL — ABNORMAL HIGH (ref 0.60–0.88)
Glucose, Bld: 277 mg/dL — ABNORMAL HIGH (ref 65–99)
Potassium: 5 mmol/L (ref 3.5–5.3)
Sodium: 136 mmol/L (ref 135–146)

## 2018-08-31 LAB — C-REACTIVE PROTEIN: CRP: 7.2 mg/L (ref <8.0)

## 2018-08-31 LAB — SEDIMENTATION RATE: Sed Rate: 14 mm/h (ref 0–30)

## 2018-08-31 NOTE — Telephone Encounter (Signed)
LMOVM for pt to return call. Does pt have a home monitor? If so we need the serial and monitor number to put in carelink.  If she does not have a home monitor we need to call carelink and have a monitor ordered.

## 2018-09-06 NOTE — Telephone Encounter (Signed)
2nd attempt   LMOVM for pt to return call.

## 2018-09-07 ENCOUNTER — Ambulatory Visit (INDEPENDENT_AMBULATORY_CARE_PROVIDER_SITE_OTHER): Payer: Medicare Other | Admitting: Sports Medicine

## 2018-09-07 ENCOUNTER — Other Ambulatory Visit: Payer: Self-pay

## 2018-09-07 ENCOUNTER — Encounter: Payer: Self-pay | Admitting: Sports Medicine

## 2018-09-07 VITALS — BP 126/74 | HR 98 | Temp 97.0°F | Resp 16

## 2018-09-07 DIAGNOSIS — L97512 Non-pressure chronic ulcer of other part of right foot with fat layer exposed: Secondary | ICD-10-CM

## 2018-09-07 DIAGNOSIS — L02619 Cutaneous abscess of unspecified foot: Secondary | ICD-10-CM

## 2018-09-07 DIAGNOSIS — M86179 Other acute osteomyelitis, unspecified ankle and foot: Secondary | ICD-10-CM

## 2018-09-07 DIAGNOSIS — L97522 Non-pressure chronic ulcer of other part of left foot with fat layer exposed: Secondary | ICD-10-CM

## 2018-09-07 DIAGNOSIS — E1142 Type 2 diabetes mellitus with diabetic polyneuropathy: Secondary | ICD-10-CM

## 2018-09-07 DIAGNOSIS — L03119 Cellulitis of unspecified part of limb: Secondary | ICD-10-CM

## 2018-09-07 NOTE — Progress Notes (Signed)
Subjective: Cindy Hahn is a 84 y.o. female patient seen today in office for follow-up evaluation of chronic left heel ulcer and bilateral foot ulcers. Patient is assisted by daughter who has been assisting with doing the wound care who reports that the wounds look better and has been using betadine to the area. Saw ID last week with decreased pain bilateral. Patient denies nausea, vomiting, fever, and chills.  No other issues noted.   Fasting blood sugar 240 last night  Patient Active Problem List   Diagnosis Date Noted  . Diabetic foot ulcer ([place]) 05/05/2018  . History of CVA (cerebrovascular accident) 05/05/2018  . Dementia ([place]) 05/05/2018  . Diabetic polyneuropathy associated with type 2 diabetes mellitus ([place]) 06/04/2015  . Pacemaker 03/11/2015  . Bradycardia 02/06/2015  . Chronic atrial fibrillation 11/01/2014  . Essential hypertension 11/01/2014  . Diabetes mellitus ([place]) 11/01/2014    Current Outpatient Medications on File Prior to Visit  Medication Sig Dispense Refill  . ALPRAZolam (XANAX) 1 MG tablet Take 1 mg by mouth.    Marland Kitchen amoxicillin-clavulanate (AUGMENTIN) 500-125 MG tablet Take 1 tablet (500 mg total) by mouth 2 (two) times daily. 60 tablet 1  . anastrozole (ARIMIDEX) 1 MG tablet take 1 tablet by mouth once daily to [place].  0  . aspirin EC 81 MG tablet Take 81 mg by mouth.    . BD INSULIN SYRINGE U/F 31G X 5/16" 1 ML MISC     . digoxin (LANOXIN) 0.125 MG tablet Take 0.0625 mg by mouth daily.   0  . diltiazem (CARDIZEM) 90 MG tablet Take 1 tablet (90 mg total) by mouth 2 (two) times daily. 60 tablet 6  . fluconazole (DIFLUCAN) 150 MG tablet     . LEVEMIR 100 UNIT/ML injection Inject 70 Units into the skin.     . metoprolol tartrate (LOPRESSOR) 50 MG tablet take 1 and 1/2 tablet by mouth twice a day  0  . neomycin-polymyxin-hydrocortisone (CORTISPORIN) OTIC solution 2-4 drops a day to wound with dressing changes 20 mL 2  . NOVOLOG FLEXPEN  100 UNIT/ML FlexPen inject 10 units SUBCUTANEOUSLY WITH MAIN MEAL  0  . potassium chloride SA (K-DUR,KLOR-CON) 20 MEQ tablet Take 20 mEq by mouth daily.   0  . rosuvastatin (CRESTOR) 20 MG tablet   0  . sertraline (ZOLOFT) 100 MG tablet     . sulfamethoxazole-trimethoprim (BACTRIM) 400-80 MG tablet Take 1 tablet by mouth 2 (two) times daily. 28 tablet 0   Current Facility-Administered Medications on File Prior to Visit  Medication Dose Route Frequency Provider Last Rate Last Dose  . triamcinolone acetonide (KENALOG) 10 MG/ML injection 10 mg  10 mg Other Once Landis Martins, DPM        Allergies  Allergen Reactions  . Levofloxacin Other (See Comments)    confusion    Objective: There were no vitals filed for this visit.  General: No acute distress, AAOx3  To right foot third and fourth toes there are prematurely healed ulcerations with surrounding redness of the toes.  There are full-thickness ulcerations bilateral sub met 1 that measures 0.3 x 0.4 cm smaller than last visit with a granular base with significant surrounding macerated skin, the ulcerations do not probe to the joint, to left foot there is a full-thickness ulcer present to the left heel that measures 0.5 in width x0.8cm in length x 0.8 cm probes to bone.  The left heel wound bed is granular with mild periwound maceration, mild soft  tissue swelling, no warmth no redness no surrounding signs of infection.  No pus from the open area at the left heel.  There is no pain to left heel, however there is pain with palpation sub-met 1 on right like previous and no pain to heel on left.  Neurovascular unchanged from prior.  Assessment and Plan:  Problem List Items Addressed This Visit      Nervous and Auditory   Diabetic polyneuropathy associated with type 2 diabetes mellitus ([place])    Other Visit Diagnoses    Foot ulcer with fat layer exposed, left ([place])    -  Primary   Acute osteomyelitis of foot ([place])       Right foot ulcer, with  fat layer exposed ([place])       Cellulitis and abscess of foot, except toes          -Patient seen and evaluated -Cleansed ulceration with wound cleanser then mechanically debrided left heel ulceration and bilateral, sub met 1 ulcers down to the level of subcutaneous fat to healthy bleeding granular tissue with no purulence.  Hemostasis was achieved with manual pressure then applied Betadine and guaze and bandaid to all areas; advised daughter to continue to do the same -ID recs reviewed  -Continue with Augmentin (patient will be on 6 weeks course) -Continue with shoes that are comfortable that helps offload the wounds like before -Advised patient to go to the ER or return to office if the wound worsens or if constitutional symptoms are present -Return to office in 3 weeks for follow-up evaluation or sooner if problems or issues arise.    Landis Martins, DPM

## 2018-09-08 NOTE — Telephone Encounter (Signed)
Pt daughter returned [place] phone call. She states the pt do have an home monitor. The pt just moved in with the daughter and they are still moving items in her home. The pt daughter states she will find the home monitor and call back with the serial and model number.

## 2018-09-15 ENCOUNTER — Telehealth: Payer: Self-pay | Admitting: Cardiology

## 2018-09-15 NOTE — Telephone Encounter (Signed)
Virtual Visit Pre-Appointment Phone Call  "(Name), I am calling you today to discuss your upcoming appointment. We are currently trying to limit exposure to the virus that causes COVID-19 by seeing patients at home rather than in the office."  1. "What is the BEST phone number to call the day of the visit?" - include this in appointment notes  2. Do you have or have access to (through a family member/friend) a smartphone with video capability that we can use for your visit?" a. If yes - list this number in appt notes as cell (if different from BEST phone #) and list the appointment type as a VIDEO visit in appointment notes b. If no - list the appointment type as a PHONE visit in appointment notes  3. Confirm consent - "In the setting of the current Covid19 crisis, you are scheduled for a (phone or video) visit with your provider on (date) at (time).  Just as we do with many in-office visits, in order for you to participate in this visit, we must obtain consent.  If you'd like, I can send this to your mychart (if signed up) or email for you to review.  Otherwise, I can obtain your verbal consent now.  All virtual visits are billed to your insurance company just like a normal visit would be.  By agreeing to a virtual visit, we'd like you to understand that the technology does not allow for your provider to perform an examination, and thus may limit your provider's ability to fully assess your condition. If your provider identifies any concerns that need to be evaluated in person, we will make arrangements to do so.  Finally, though the technology is pretty good, we cannot assure that it will always work on either your or our end, and in the setting of a video visit, we may have to convert it to a phone-only visit.  In either situation, we cannot ensure that we have a secure connection.  Are you willing to proceed?" STAFF: Did the patient verbally acknowledge consent to telehealth visit? Document  YES/NO here: YES  4. Advise patient to be prepared - "Two hours prior to your appointment, go ahead and check your blood pressure, pulse, oxygen saturation, and your weight (if you have the equipment to check those) and write them all down. When your visit starts, your provider will ask you for this information. If you have an Apple Watch or Kardia device, please plan to have heart rate information ready on the day of your appointment. Please have a pen and paper handy nearby the day of the visit as well."  5. Give patient instructions for MyChart download to smartphone OR [email or web address] as below if video visit (depending on what platform provider is using)  6. Inform patient they will receive a phone call 15 minutes prior to their appointment time (may be from unknown caller ID) so they should be prepared to answer    TELEPHONE CALL NOTE  Cindy Hahn has been deemed a candidate for a follow-up tele-health visit to limit community exposure during the Covid-19 pandemic. I spoke with the patient via phone to ensure availability of phone/video source, confirm preferred email & phone number, and discuss instructions and expectations.  I reminded Cindy Hahn to be prepared with any vital sign and/or heart rhythm information that could potentially be obtained via home monitoring, at the time of her visit. I reminded Cindy Hahn to expect a phone call prior to  her visit.  Frederic Jericho 09/15/2018 3:20 PM   INSTRUCTIONS FOR DOWNLOADING THE MYCHART APP TO SMARTPHONE  - The patient must first make sure to have activated MyChart and know their login information - If Apple, go to [employer] and type in MyChart in the search bar and download the app. If Android, ask patient to go to [employer] and type in [employer] in the search bar and download the app. The app is free but as with any other app downloads, their phone may require them to verify saved payment information or  Apple/Android password.  - The patient will need to then log into the app with their MyChart username and password, and select [facility] as their healthcare provider to link the account. When it is time for your visit, go to the MyChart app, find appointments, and click Begin Video Visit. Be sure to Select Allow for your device to access the Microphone and Camera for your visit. You will then be connected, and your provider will be with you shortly.  **If they have any issues connecting, or need assistance please contact MyChart service desk [phone])**  **If using a computer, in order to ensure the best quality for their visit they will need to use either of the following Internet Browsers: [employer], or Google Chrome**  IF USING DOXIMITY or DOXY.ME - The patient will receive a link just prior to their visit by text.     FULL LENGTH CONSENT FOR TELE-HEALTH VISIT   I hereby voluntarily request, consent and authorize [facility] and its employed or contracted physicians, physician assistants, nurse practitioners or other licensed health care professionals (the Practitioner), to provide me with telemedicine health care services (the Services") as deemed necessary by the treating Practitioner. I acknowledge and consent to receive the Services by the Practitioner via telemedicine. I understand that the telemedicine visit will involve communicating with the Practitioner through live audiovisual communication technology and the disclosure of certain medical information by electronic transmission. I acknowledge that I have been given the opportunity to request an in-person assessment or other available alternative prior to the telemedicine visit and am voluntarily participating in the telemedicine visit.  I understand that I have the right to withhold or withdraw my consent to the use of telemedicine in the course of my care at any time, without affecting my right to future care  or treatment, and that the Practitioner or I may terminate the telemedicine visit at any time. I understand that I have the right to inspect all information obtained and/or recorded in the course of the telemedicine visit and may receive copies of available information for a reasonable fee.  I understand that some of the potential risks of receiving the Services via telemedicine include:   Delay or interruption in medical evaluation due to technological equipment failure or disruption;  Information transmitted may not be sufficient (e.g. poor resolution of images) to allow for appropriate medical decision making by the Practitioner; and/or   In rare instances, security protocols could fail, causing a breach of personal health information.  Furthermore, I acknowledge that it is my responsibility to provide information about my medical history, conditions and care that is complete and accurate to the best of my ability. I acknowledge that Practitioner's advice, recommendations, and/or decision may be based on factors not within their control, such as incomplete or inaccurate data provided by me or distortions of diagnostic images or specimens that may result from electronic transmissions. I  understand that the practice of medicine is not an exact science and that Practitioner makes no warranties or guarantees regarding treatment outcomes. I acknowledge that I will receive a copy of this consent concurrently upon execution via email to the email address I last provided but may also request a printed copy by calling the office of [facility].    I understand that my insurance will be billed for this visit.   I have read or had this consent read to me.  I understand the contents of this consent, which adequately explains the benefits and risks of the Services being provided via telemedicine.   I have been provided ample opportunity to ask questions regarding this consent and the Services and have had  my questions answered to my satisfaction.  I give my informed consent for the services to be provided through the use of telemedicine in my medical care  By participating in this telemedicine visit I agree to the above.

## 2018-09-17 DIAGNOSIS — I480 Paroxysmal atrial fibrillation: Secondary | ICD-10-CM | POA: Diagnosis not present

## 2018-09-17 DIAGNOSIS — I10 Essential (primary) hypertension: Secondary | ICD-10-CM | POA: Diagnosis not present

## 2018-09-19 NOTE — Telephone Encounter (Signed)
Spoke w/ pt daughter and she provided me w/ monitor and serial number. Input information in carelink. Pt daughter said she would send transmission later today. Pt is still sleeping right now. Denies needing help sending transmission.

## 2018-09-27 ENCOUNTER — Encounter: Payer: Self-pay | Admitting: Cardiology

## 2018-09-27 ENCOUNTER — Other Ambulatory Visit: Payer: Self-pay

## 2018-09-27 ENCOUNTER — Telehealth (INDEPENDENT_AMBULATORY_CARE_PROVIDER_SITE_OTHER): Payer: Medicare Other | Admitting: Cardiology

## 2018-09-27 VITALS — BP 107/77 | HR 133

## 2018-09-27 DIAGNOSIS — I10 Essential (primary) hypertension: Secondary | ICD-10-CM

## 2018-09-27 DIAGNOSIS — Z8673 Personal history of transient ischemic attack (TIA), and cerebral infarction without residual deficits: Secondary | ICD-10-CM

## 2018-09-27 DIAGNOSIS — Z950 Presence of cardiac pacemaker: Secondary | ICD-10-CM

## 2018-09-27 DIAGNOSIS — I482 Chronic atrial fibrillation, unspecified: Secondary | ICD-10-CM

## 2018-09-27 DIAGNOSIS — E08621 Diabetes mellitus due to underlying condition with foot ulcer: Secondary | ICD-10-CM

## 2018-09-27 DIAGNOSIS — L97403 Non-pressure chronic ulcer of unspecified heel and midfoot with necrosis of muscle: Secondary | ICD-10-CM

## 2018-09-27 NOTE — Progress Notes (Signed)
Virtual Visit via Video Note   This visit type was conducted due to national recommendations for restrictions regarding the COVID-19 Pandemic (e.g. social distancing) in an effort to limit this patient's exposure and mitigate transmission in our community.  Due to her co-morbid illnesses, this patient is at least at moderate risk for complications without adequate follow up.  This format is felt to be most appropriate for this patient at this time.  All issues noted in this document were discussed and addressed.  A limited physical exam was performed with this format.  Please refer to the patient's chart for her consent to telehealth for [facility].  Evaluation Performed:  Follow-up visit  This visit type was conducted due to national recommendations for restrictions regarding the COVID-19 Pandemic (e.g. social distancing).  This format is felt to be most appropriate for this patient at this time.  All issues noted in this document were discussed and addressed.  No physical exam was performed (except for noted visual exam findings with Video Visits).  Please refer to the patient's chart (MyChart message for video visits and phone note for telephone visits) for the patient's consent to telehealth for [facility].  Date:  09/27/2018  ID: Cindy Hahn, DOB [birth year], MRN [id number]   Patient Location: [place] [address]   Provider location:   [place] Office  PCP:  Ronita Hipps, MD  Cardiologist:  Jenne Campus, MD     Chief Complaint: Doing fine  History of Present Illness:    Cindy Hahn is a 84 y.o. female  who presents via audio/video conferencing for a telehealth visit today.  With complex past medical history that includes chronic atrial fibrillation, status post VVI pacemaker implantation bedside [employer] device, history of diabetes history of hyperlipidemia hypertension CVA now ongoing problem is a chronic foot infection.   She required long-term oral antibiotic.  Overall she is doing well but complain of being tired weak and exhausted.  Denies have any chest pain tightness squeezing pressure been chest no palpitations.   The patient does not have symptoms concerning for COVID-19 infection (fever, chills, cough, or new SHORTNESS OF BREATH).    Prior CV studies:   The following studies were reviewed today:       Past Medical History:  Diagnosis Date  . A-fib ([place])   . Cancer ([place])   . Dementia ([place])   . Diabetes mellitus without complication ([place])   . Hyperlipidemia   . Hypertension   . Stroke [facility])     Past Surgical History:  Procedure Laterality Date  . ABDOMINAL HYSTERECTOMY    . BLADDER SURGERY    . CATARACT EXTRACTION    . ILEOCECETOMY    . KNEE SURGERY    . OOPHORECTOMY    . SHOULDER SURGERY    . WRIST SURGERY       Current Meds  Medication Sig  . ALPRAZolam (XANAX) 1 MG tablet Take 1 mg by mouth.  Marland Kitchen amoxicillin-clavulanate (AUGMENTIN) 500-125 MG tablet Take 1 tablet (500 mg total) by mouth 2 (two) times daily.  Marland Kitchen anastrozole (ARIMIDEX) 1 MG tablet take 1 tablet by mouth once daily to [place].  Marland Kitchen aspirin EC 81 MG tablet Take 81 mg by mouth.  . BD INSULIN SYRINGE U/F 31G X 5/16" 1 ML MISC   . digoxin (LANOXIN) 0.125 MG tablet Take 0.0625 mg by mouth daily.   Marland Kitchen diltiazem (CARDIZEM) 90 MG tablet Take  1 tablet (90 mg total) by mouth 2 (two) times daily. (Patient taking differently: Take 60 mg by mouth 2 (two) times daily. )  . LEVEMIR 100 UNIT/ML injection Inject 74 Units into the skin.   . metoprolol tartrate (LOPRESSOR) 50 MG tablet Take 25 mg by mouth 2 (two) times daily.   Marland Kitchen NOVOLOG FLEXPEN 100 UNIT/ML FlexPen inject 10 units SUBCUTANEOUSLY WITH MAIN MEAL  . potassium chloride SA (K-DUR,KLOR-CON) 20 MEQ tablet Take 20 mEq by mouth daily.   . rosuvastatin (CRESTOR) 20 MG tablet   . sertraline (ZOLOFT) 100 MG tablet    Current Facility-Administered  Medications for the 09/27/18 encounter (Telemedicine) with Park Liter, MD  Medication  . triamcinolone acetonide (KENALOG) 10 MG/ML injection 10 mg      Family History: The patient's family history includes Cancer in her father; Cardiomyopathy in her mother; Diabetes in her mother; Heart disease in her father; Tuberculosis in her mother.   ROS:   Please see the history of present illness.     All other systems reviewed and are negative.   Labs/Other Tests and Data Reviewed:     Recent Labs: 08/30/2018: BUN 27; Creat 1.41; Hemoglobin 14.0; Platelets 173; Potassium 5.0; Sodium 136  Recent Lipid Panel No results found for: CHOL, TRIG, HDL, CHOLHDL, VLDL, LDLCALC, LDLDIRECT    Exam:    Vital Signs:  BP 107/77   Pulse (!) 133     Wt Readings from Last 3 Encounters:  08/30/18 136 lb (61.7 kg)  05/30/18 138 lb (62.6 kg)  05/24/18 139 lb (63 kg)     Well nourished, well developed in no acute distress. Alert awake oriented x3 happy to be able to talk to me.  I talked to her as well as with her daughter using a video link.  Not in any distress  Diagnosis for this visit:   1. Chronic atrial fibrillation   2. Essential hypertension   3. Diabetic ulcer of midfoot associated with diabetes mellitus due to underlying condition, with necrosis of muscle, unspecified laterality ([place])   4. History of CVA (cerebrovascular accident)   5. Pacemaker      ASSESSMENT & PLAN:    1.  Chronic atrial fibrillation.  Last interrogation of her device showed elevated heart rate her diltiazem has been increased from 60 twice daily to 90 twice a day however today we also discovered that she did not take metoprolol the proper way according to our list she was taking 75 mg twice daily of metoprolol titrate however she was taking only 25 twice daily.  I think the key right now will be doing interrogate her device and see what the histogram look like to make a decision about therapy for AV blocking  agent. 2.  Essential hypertension blood pressure well controlled continue present medications. 3.  Diabetic ulcer followed by internal medicine team. 4.  History of CVA stable no new issues. 5.  Pacemaker present.  I did review latest interrogation.  We will make arrangements to interrogate remotely.  COVID-19 Education: The signs and symptoms of COVID-19 were discussed with the patient and how to seek care for testing (follow up with PCP or arrange E-visit).  The importance of social distancing was discussed today.  Patient Risk:   After full review of this patients clinical status, I feel that they are at least moderate risk at this time.  Time:   Today, I have spent 21 minutes with the patient with telehealth technology discussing pt health  issues.  I spent 5 minutes reviewing her chart before the visit.  Visit was finished at 1:38 PM.    Medication Adjustments/Labs and Tests Ordered: Current medicines are reviewed at length with the patient today.  Concerns regarding medicines are outlined above.  No orders of the defined types were placed in this encounter.  Medication changes: No orders of the defined types were placed in this encounter.    Disposition: Follow-up in 3 months  Signed, Park Liter, MD, [facility] 09/27/2018 1:37 PM    [place]

## 2018-09-27 NOTE — Patient Instructions (Signed)
Medication Instructions:  Your physician recommends that you continue on your current medications as directed. Please refer to the Current Medication list given to you today.  If you need a refill on your cardiac medications before your next appointment, please call your pharmacy.   Lab work: None.  If you have labs (blood work) drawn today and your tests are completely normal, you will receive your results only by: . MyChart Message (if you have MyChart) OR . A paper copy in the mail If you have any lab test that is abnormal or we need to change your treatment, we will call you to review the results.  Testing/Procedures: None.   Follow-Up: At [facility], you and your health needs are our priority.  As part of our continuing mission to provide you with exceptional heart care, we have created designated Provider Care Teams.  These Care Teams include your primary Cardiologist (physician) and Advanced Practice Providers (APPs -  Physician Assistants and Nurse Practitioners) who all work together to provide you with the care you need, when you need it. You will need a follow up appointment in 3 months.  Please call our office 2 months in advance to schedule this appointment.  You may see No primary care provider on file. or another member of our [facility] Provider Team in [place]: Brian Munley, MD . Rajan Revankar, MD  Any Other Special Instructions Will Be Listed Below (If Applicable).     

## 2018-09-28 ENCOUNTER — Telehealth: Payer: Self-pay | Admitting: *Deleted

## 2018-09-28 NOTE — Telephone Encounter (Signed)
LMOVM for Cindy Hahn [facility]) requesting transmission from pt's Carelink monitor. [place] phone number if they need assistance sending.

## 2018-09-29 ENCOUNTER — Ambulatory Visit (INDEPENDENT_AMBULATORY_CARE_PROVIDER_SITE_OTHER): Payer: Medicare Other | Admitting: Sports Medicine

## 2018-09-29 ENCOUNTER — Encounter: Payer: Self-pay | Admitting: Sports Medicine

## 2018-09-29 ENCOUNTER — Other Ambulatory Visit: Payer: Self-pay

## 2018-09-29 VITALS — Temp 97.7°F | Resp 16

## 2018-09-29 DIAGNOSIS — F0150 Vascular dementia without behavioral disturbance: Secondary | ICD-10-CM

## 2018-09-29 DIAGNOSIS — L84 Corns and callosities: Secondary | ICD-10-CM

## 2018-09-29 DIAGNOSIS — E1142 Type 2 diabetes mellitus with diabetic polyneuropathy: Secondary | ICD-10-CM

## 2018-09-29 DIAGNOSIS — L97511 Non-pressure chronic ulcer of other part of right foot limited to breakdown of skin: Secondary | ICD-10-CM

## 2018-09-29 DIAGNOSIS — M86179 Other acute osteomyelitis, unspecified ankle and foot: Secondary | ICD-10-CM | POA: Diagnosis not present

## 2018-09-29 DIAGNOSIS — L97522 Non-pressure chronic ulcer of other part of left foot with fat layer exposed: Secondary | ICD-10-CM

## 2018-09-29 NOTE — Progress Notes (Signed)
Subjective: Cindy Hahn is a 84 y.o. female patient seen today in office for follow-up evaluation of chronic left heel ulcer and bilateral foot ulcers. Patient is assisted by daughter who has been assisting with doing the wound care however this week she has been doing her own wound care. Patient denies pain except one episode of pain at 1st MTPJ.  Daughter reports that she will see ID in 2 weeks via televisit. Patient denies nausea, vomiting, fever, and chills.  No other issues noted.   Fasting blood sugar 76 last night  Patient Active Problem List   Diagnosis Date Noted  . Diabetic foot ulcer ([place]) 05/05/2018  . History of CVA (cerebrovascular accident) 05/05/2018  . Dementia ([place]) 05/05/2018  . Diabetic polyneuropathy associated with type 2 diabetes mellitus ([place]) 06/04/2015  . Pacemaker 03/11/2015  . Bradycardia 02/06/2015  . Chronic atrial fibrillation 11/01/2014  . Essential hypertension 11/01/2014  . Diabetes mellitus ([place]) 11/01/2014    Current Outpatient Medications on File Prior to Visit  Medication Sig Dispense Refill  . ALPRAZolam (XANAX) 1 MG tablet Take 1 mg by mouth.    Marland Kitchen amoxicillin-clavulanate (AUGMENTIN) 500-125 MG tablet Take 1 tablet (500 mg total) by mouth 2 (two) times daily. 60 tablet 1  . anastrozole (ARIMIDEX) 1 MG tablet take 1 tablet by mouth once daily to [place].  0  . aspirin EC 81 MG tablet Take 81 mg by mouth.    . BD INSULIN SYRINGE U/F 31G X 5/16" 1 ML MISC     . digoxin (LANOXIN) 0.125 MG tablet Take 0.0625 mg by mouth daily.   0  . diltiazem (CARDIZEM) 90 MG tablet Take 1 tablet (90 mg total) by mouth 2 (two) times daily. (Patient taking differently: Take 60 mg by mouth 2 (two) times daily. ) 60 tablet 6  . LEVEMIR 100 UNIT/ML injection Inject 74 Units into the skin.     . metoprolol tartrate (LOPRESSOR) 50 MG tablet Take 25 mg by mouth 2 (two) times daily.   0  . NOVOLOG FLEXPEN 100 UNIT/ML FlexPen inject 10 units  SUBCUTANEOUSLY WITH MAIN MEAL  0  . potassium chloride SA (K-DUR,KLOR-CON) 20 MEQ tablet Take 20 mEq by mouth daily.   0  . rosuvastatin (CRESTOR) 20 MG tablet   0  . sertraline (ZOLOFT) 100 MG tablet      Current Facility-Administered Medications on File Prior to Visit  Medication Dose Route Frequency Provider Last Rate Last Dose  . triamcinolone acetonide (KENALOG) 10 MG/ML injection 10 mg  10 mg Other Once Landis Martins, DPM        Allergies  Allergen Reactions  . Levofloxacin Other (See Comments)    confusion    Objective: There were no vitals filed for this visit.  General: No acute distress, AAOx3  Abrasions to right 3-4 toes. Sub met 1 ulcerations are prematurely healed. To left foot there is a full-thickness ulcer present to the left heel that measures 0.6in width x0.8cm in length x 0.8 cm probes to bone like before at the 1 o'clock position of the wound.  The left heel wound bed is granular with mild periwound maceration, mild soft tissue swelling, no warmth no redness no surrounding signs of infection.  No pus from the open area at the left heel.  There is no pain to left heel. Pain occasionally to 1st MTPJ bilateral. Neurovascular unchanged from prior.  Assessment and Plan:  Problem List Items Addressed This Visit  Nervous and Auditory   Diabetic polyneuropathy associated with type 2 diabetes mellitus (HCC)   Dementia ([place])    Other Visit Diagnoses    Acute osteomyelitis of foot ([place])    -  Primary   Foot ulcer with fat layer exposed, left ([place])       Foot ulcer, right, limited to breakdown of skin ([place])       Pre-ulcerative calluses          -Patient seen and evaluated -Cleansed ulceration with wound cleanser then mechanically debrided left heel ulceration down to the level of subcutaneous fat to healthy bleeding granular tissue with no purulence.  Hemostasis was achieved with manual pressure then applied Betadine ointment and guaze and bandaid to all areas;  advised daughter to continue to do the same -Mechanically debrided callus sub met 1 bilateral with no opening areas appear to be prematurely healed -Continue with ID recs, Augmentin  -Continue with shoes that are comfortable that helps offload the wounds like before -Advised patient to go to the ER or return to office if the wound worsens or if constitutional symptoms are present -Return to office in 3-4 weeks for follow-up evaluation or sooner if problems or issues arise.    Landis Martins, DPM

## 2018-09-30 NOTE — Telephone Encounter (Signed)
Spoke with Lori(DPR) and notified pt needs to have remote transmission sent. Cecille Rubin stated she has been trying to send remote but continues to get error messages. Not at home to troubleshoot  Monitor. Will call back when she arrives home to attempt transmission.

## 2018-10-04 DIAGNOSIS — Z794 Long term (current) use of insulin: Secondary | ICD-10-CM | POA: Diagnosis not present

## 2018-10-04 DIAGNOSIS — N183 Chronic kidney disease, stage 3 (moderate): Secondary | ICD-10-CM | POA: Diagnosis not present

## 2018-10-04 DIAGNOSIS — Z79899 Other long term (current) drug therapy: Secondary | ICD-10-CM | POA: Diagnosis not present

## 2018-10-04 DIAGNOSIS — Z0000 Encounter for general adult medical examination without abnormal findings: Secondary | ICD-10-CM | POA: Diagnosis not present

## 2018-10-04 DIAGNOSIS — E0865 Diabetes mellitus due to underlying condition with hyperglycemia: Secondary | ICD-10-CM | POA: Diagnosis not present

## 2018-10-04 DIAGNOSIS — E0822 Diabetes mellitus due to underlying condition with diabetic chronic kidney disease: Secondary | ICD-10-CM | POA: Diagnosis not present

## 2018-10-04 DIAGNOSIS — E785 Hyperlipidemia, unspecified: Secondary | ICD-10-CM | POA: Diagnosis not present

## 2018-10-04 NOTE — Telephone Encounter (Signed)
Pt daughter Cecille Rubin tried to send the transmission while I was on the phone however, she got the error code [id number]. I gave her the number to [place] support to get additional help.

## 2018-10-04 NOTE — Telephone Encounter (Signed)
LMOVM for pt dtr to return call.

## 2018-10-07 NOTE — Telephone Encounter (Signed)
LMOVM for pt daughter to return call. Following up for an update w/ pt home monitor.

## 2018-10-10 NOTE — Telephone Encounter (Signed)
Pt daughter states she know we have been trying to contact her but she is out of town and not with the pt. She has not contacted tech support to trouble shoot the monitor. She wants to know if she should just bring her mom into the office to be checked. I told her when she get in town to call tech support to get help trouble shooting that monitor and lets go from there. The pt daughter verbalized understanding.

## 2018-10-10 NOTE — Telephone Encounter (Signed)
I called to follow up with the daughter about the pt monitor. I left my direct office number on her voicemail to call back.

## 2018-10-17 ENCOUNTER — Encounter: Payer: Self-pay | Admitting: Internal Medicine

## 2018-10-17 ENCOUNTER — Ambulatory Visit (INDEPENDENT_AMBULATORY_CARE_PROVIDER_SITE_OTHER): Payer: Medicare Other | Admitting: Internal Medicine

## 2018-10-17 ENCOUNTER — Other Ambulatory Visit: Payer: Self-pay

## 2018-10-17 DIAGNOSIS — M8618 Other acute osteomyelitis, other site: Secondary | ICD-10-CM | POA: Diagnosis not present

## 2018-10-17 DIAGNOSIS — M869 Osteomyelitis, unspecified: Secondary | ICD-10-CM | POA: Insufficient documentation

## 2018-10-17 NOTE — Progress Notes (Signed)
Virtual Visit via Telephone Note  I connected with Cindy Hahn on 10/17/18 at 10:45 AM EDT by telephone and verified that I am speaking with the correct person using two identifiers.  Location: Patient: Home Provider: [facility]   I discussed the limitations, risks, security and privacy concerns of performing an evaluation and management service by telephone and the availability of in person appointments. I also discussed with the patient that there may be a patient responsible charge related to this service. The patient expressed understanding and agreed to proceed.   History of Present Illness: I called and spoke with Baird Lyons, Ms. Meier's daughter.  Ms. Rynders has chronic ulcerations on both feet that have been there for several years.  She has had a particularly stubborn ulcer on the plantar surface of her left foot beneath her calcaneus.  She recently developed increased drainage and underwent a biopsy that showed acute osteomyelitis on pathologic examination.  Cultures grew mixed skin flora with Klebsiella oxytoca susceptible to most antibiotics tested.  He was placed on a 10-day course of oral trimethoprim sulfamethoxazole.  She and her daughter thinks that the drainage may have improved slightly.  I saw her in mid May and recommended oral amoxicillin clavulanate.  Her daughter thinks she has 1 more week to go.  She is tolerating it well.  She says the ulcer appears the same.  There is some scant blood-tinged drainage on the dressing when it is changed each day.  There is no odor.  She has no new ulcerated areas.   Observations/Objective:   Assessment and Plan: I doubt that her ulcers will ever heal given that she is unable to completely offload pressure.  However, I do hope that the underlying osteomyelitis has been cured.  She will complete the remainder of her amoxicillin clavulanate and follow-up here as needed.  Follow Up Instructions: Complete 1 more week of amoxicillin  clavulanate Follow-up here as needed   I discussed the assessment and treatment plan with the patient. The patient was provided an opportunity to ask questions and all were answered. The patient agreed with the plan and demonstrated an understanding of the instructions.   The patient was advised to call back or seek an in-person evaluation if the symptoms worsen or if the condition fails to improve as anticipated.  I provided 13 minutes of non-face-to-face time during this encounter.   Michel Bickers, MD

## 2018-10-18 NOTE — Telephone Encounter (Signed)
Letter sent 10-17-2018

## 2018-10-20 ENCOUNTER — Other Ambulatory Visit: Payer: Self-pay

## 2018-10-20 ENCOUNTER — Encounter: Payer: Self-pay | Admitting: Sports Medicine

## 2018-10-20 ENCOUNTER — Ambulatory Visit (INDEPENDENT_AMBULATORY_CARE_PROVIDER_SITE_OTHER): Payer: Medicare Other | Admitting: Sports Medicine

## 2018-10-20 VITALS — Temp 97.5°F | Resp 16

## 2018-10-20 DIAGNOSIS — L84 Corns and callosities: Secondary | ICD-10-CM

## 2018-10-20 DIAGNOSIS — L97522 Non-pressure chronic ulcer of other part of left foot with fat layer exposed: Secondary | ICD-10-CM | POA: Diagnosis not present

## 2018-10-20 DIAGNOSIS — M86179 Other acute osteomyelitis, unspecified ankle and foot: Secondary | ICD-10-CM

## 2018-10-20 DIAGNOSIS — L97511 Non-pressure chronic ulcer of other part of right foot limited to breakdown of skin: Secondary | ICD-10-CM

## 2018-10-20 DIAGNOSIS — E1142 Type 2 diabetes mellitus with diabetic polyneuropathy: Secondary | ICD-10-CM

## 2018-10-20 DIAGNOSIS — C50411 Malignant neoplasm of upper-outer quadrant of right female breast: Secondary | ICD-10-CM | POA: Diagnosis not present

## 2018-10-20 DIAGNOSIS — F0150 Vascular dementia without behavioral disturbance: Secondary | ICD-10-CM

## 2018-10-20 DIAGNOSIS — R922 Inconclusive mammogram: Secondary | ICD-10-CM | POA: Diagnosis not present

## 2018-10-20 NOTE — Progress Notes (Signed)
Subjective: Cindy Hahn is a 84 y.o. female patient seen today in office for follow-up evaluation of chronic left heel ulcer and bilateral foot ulcers. Patient is assisted by daughter who has been assisting with doing the wound care however this week she has been doing her own wound care because daughter has been out of town. Patient denies pain except at heel at lateral side. Patient denies nausea, vomiting, fever, and chills.  No other issues noted.   Fasting blood sugar 170 two days ago and A1c 11.9.   Patient Active Problem List   Diagnosis Date Noted  . Osteomyelitis ([place]) 10/17/2018  . Diabetic foot ulcer ([place]) 05/05/2018  . History of CVA (cerebrovascular accident) 05/05/2018  . Dementia ([place]) 05/05/2018  . Diabetic polyneuropathy associated with type 2 diabetes mellitus ([place]) 06/04/2015  . Pacemaker 03/11/2015  . Bradycardia 02/06/2015  . Chronic atrial fibrillation 11/01/2014  . Essential hypertension 11/01/2014  . Diabetes mellitus ([place]) 11/01/2014    Current Outpatient Medications on File Prior to Visit  Medication Sig Dispense Refill  . ALPRAZolam (XANAX) 1 MG tablet Take 1 mg by mouth.    Marland Kitchen amoxicillin-clavulanate (AUGMENTIN) 500-125 MG tablet Take 1 tablet (500 mg total) by mouth 2 (two) times daily. 60 tablet 1  . anastrozole (ARIMIDEX) 1 MG tablet take 1 tablet by mouth once daily to [place].  0  . aspirin EC 81 MG tablet Take 81 mg by mouth.    . BD INSULIN SYRINGE U/F 31G X 5/16" 1 ML MISC     . digoxin (LANOXIN) 0.125 MG tablet Take 0.0625 mg by mouth daily.   0  . diltiazem (CARDIZEM) 90 MG tablet Take 1 tablet (90 mg total) by mouth 2 (two) times daily. (Patient taking differently: Take 60 mg by mouth 2 (two) times daily. ) 60 tablet 6  . LEVEMIR 100 UNIT/ML injection Inject 74 Units into the skin.     . metoprolol tartrate (LOPRESSOR) 50 MG tablet Take 25 mg by mouth 2 (two) times daily.   0  . NOVOLOG FLEXPEN 100 UNIT/ML FlexPen  inject 10 units SUBCUTANEOUSLY WITH MAIN MEAL  0  . potassium chloride SA (K-DUR,KLOR-CON) 20 MEQ tablet Take 20 mEq by mouth daily.   0  . rosuvastatin (CRESTOR) 20 MG tablet   0  . sertraline (ZOLOFT) 100 MG tablet      Current Facility-Administered Medications on File Prior to Visit  Medication Dose Route Frequency Provider Last Rate Last Dose  . triamcinolone acetonide (KENALOG) 10 MG/ML injection 10 mg  10 mg Other Once Landis Martins, DPM        Allergies  Allergen Reactions  . Levofloxacin Other (See Comments)    confusion    Objective: There were no vitals filed for this visit.  General: No acute distress, AAOx3  To the right sub met 1 there is a partial thickness ulcer that measures 2x1cm.  To left foot there is a full-thickness ulcer present to the left heel that measures 1 cm width x1 cm in length x 1 cm probes to bone like before and undermines.  The left heel wound bed is granular with moderate periwound maceration, mild soft tissue swelling, no warmth no redness no surrounding signs of infection.  No pus from the open area at the left heel.  There is + pain to left heel. Pain occasionally to 1st MTPJ bilateral. Neurovascular unchanged from prior.  Assessment and Plan:  Problem List Items Addressed This Visit  Nervous and Auditory   Diabetic polyneuropathy associated with type 2 diabetes mellitus ([place])   Dementia ([place])    Other Visit Diagnoses    Foot ulcer with fat layer exposed, left ([place])    -  Primary   Foot ulcer, right, limited to breakdown of skin ([place])       Acute osteomyelitis of foot ([place])       Pre-ulcerative calluses          -Patient seen and evaluated -Cleansed ulceration with wound cleanser then mechanically debrided right sub met 1 and left heel ulceration down to the level of subcutaneous fat to healthy bleeding granular tissue with no purulence.  Hemostasis was achieved with manual pressure then applied Iodosorb and guaze and bandaid to all  areas; advised daughter and patient to continue to do the same -Mechanically debrided callus sub met 1 on left with no opening areas appear to be prematurely healed -Continue with ID recs, Augmentin has approximately 1 more week left -Continue with shoes that are comfortable that helps offload the wounds like before and to refrain from walking barefoot -Advised patient to go to the ER or return to office if the wound worsens or if constitutional symptoms are present -Return to office in 3-4 weeks for follow-up evaluation or sooner if problems or issues arise.    Landis Martins, DPM

## 2018-10-25 DIAGNOSIS — Z853 Personal history of malignant neoplasm of breast: Secondary | ICD-10-CM | POA: Diagnosis not present

## 2018-10-25 DIAGNOSIS — Z170 Estrogen receptor positive status [ER+]: Secondary | ICD-10-CM | POA: Diagnosis not present

## 2018-10-25 DIAGNOSIS — C50411 Malignant neoplasm of upper-outer quadrant of right female breast: Secondary | ICD-10-CM | POA: Diagnosis not present

## 2018-10-28 NOTE — Telephone Encounter (Signed)
Melissa,  Please make appt with Dr Curt Bears in [place] in August for device recheck.   Thanks Chanetta Marshall, NP 10/28/2018 7:34 AM

## 2018-10-31 ENCOUNTER — Telehealth: Payer: Self-pay | Admitting: Cardiology

## 2018-10-31 NOTE — Telephone Encounter (Signed)
New message    Message was received from Amber to schedule pt for  visit with Dr. Curt Bears. Called and spoke w/pt daughter and informed her September schedule was not out for September for [facility] in [place]. Offered her [place] with Dr. Curt Bears and she said she did not want to drive that far. Recall was entered for September for Dr. Curt Bears in [place].

## 2018-11-10 ENCOUNTER — Encounter: Payer: Self-pay | Admitting: Sports Medicine

## 2018-11-10 ENCOUNTER — Ambulatory Visit (INDEPENDENT_AMBULATORY_CARE_PROVIDER_SITE_OTHER): Payer: Medicare Other | Admitting: Sports Medicine

## 2018-11-10 ENCOUNTER — Other Ambulatory Visit: Payer: Self-pay

## 2018-11-10 DIAGNOSIS — E1142 Type 2 diabetes mellitus with diabetic polyneuropathy: Secondary | ICD-10-CM | POA: Diagnosis not present

## 2018-11-10 DIAGNOSIS — M86179 Other acute osteomyelitis, unspecified ankle and foot: Secondary | ICD-10-CM

## 2018-11-10 DIAGNOSIS — L97511 Non-pressure chronic ulcer of other part of right foot limited to breakdown of skin: Secondary | ICD-10-CM | POA: Diagnosis not present

## 2018-11-10 DIAGNOSIS — L97522 Non-pressure chronic ulcer of other part of left foot with fat layer exposed: Secondary | ICD-10-CM | POA: Diagnosis not present

## 2018-11-10 DIAGNOSIS — L84 Corns and callosities: Secondary | ICD-10-CM

## 2018-11-10 DIAGNOSIS — F0150 Vascular dementia without behavioral disturbance: Secondary | ICD-10-CM

## 2018-11-10 DIAGNOSIS — L97403 Non-pressure chronic ulcer of unspecified heel and midfoot with necrosis of muscle: Secondary | ICD-10-CM

## 2018-11-10 DIAGNOSIS — E08621 Diabetes mellitus due to underlying condition with foot ulcer: Secondary | ICD-10-CM

## 2018-11-10 MED ORDER — AMOXICILLIN-POT CLAVULANATE 500-125 MG PO TABS: 1.0000 | ORAL_TABLET | Freq: Two times a day (BID) | ORAL | 0 refills | Status: DC

## 2018-11-10 NOTE — Progress Notes (Signed)
Subjective: Cindy Hahn is a 84 y.o. female patient seen today in office for follow-up evaluation of chronic left heel ulcer and bilateral foot ulcers. Patient is assisted by daughter who has been assisting with doing the wound care however this week she has been doing her own wound care because daughter has been out of town again and has not been available to help with the wound care. Patient denies pain except at heel and feels like the area is draining more.  Patient denies nausea, vomiting, fever, and chills.  No other issues noted.   Fasting blood sugar 268  and A1c 11.9.   Patient Active Problem List   Diagnosis Date Noted  . Osteomyelitis ([place]) 10/17/2018  . Diabetic foot ulcer ([place]) 05/05/2018  . History of CVA (cerebrovascular accident) 05/05/2018  . Dementia ([place]) 05/05/2018  . Diabetic polyneuropathy associated with type 2 diabetes mellitus ([place]) 06/04/2015  . Pacemaker 03/11/2015  . Bradycardia 02/06/2015  . Chronic atrial fibrillation 11/01/2014  . Essential hypertension 11/01/2014  . Diabetes mellitus ([place]) 11/01/2014    Current Outpatient Medications on File Prior to Visit  Medication Sig Dispense Refill  . ALPRAZolam (XANAX) 1 MG tablet Take 1 mg by mouth.    Marland Kitchen amoxicillin-clavulanate (AUGMENTIN) 500-125 MG tablet Take 1 tablet (500 mg total) by mouth 2 (two) times daily. 60 tablet 1  . anastrozole (ARIMIDEX) 1 MG tablet take 1 tablet by mouth once daily to [place].  0  . aspirin EC 81 MG tablet Take 81 mg by mouth.    . BD INSULIN SYRINGE U/F 31G X 5/16" 1 ML MISC     . digoxin (LANOXIN) 0.125 MG tablet Take 0.0625 mg by mouth daily.   0  . diltiazem (CARDIZEM) 90 MG tablet Take 1 tablet (90 mg total) by mouth 2 (two) times daily. (Patient taking differently: Take 60 mg by mouth 2 (two) times daily. ) 60 tablet 6  . LEVEMIR 100 UNIT/ML injection Inject 74 Units into the skin.     . metoprolol tartrate (LOPRESSOR) 50 MG tablet Take 25 mg by  mouth 2 (two) times daily.   0  . NOVOLOG FLEXPEN 100 UNIT/ML FlexPen inject 10 units SUBCUTANEOUSLY WITH MAIN MEAL  0  . potassium chloride SA (K-DUR,KLOR-CON) 20 MEQ tablet Take 20 mEq by mouth daily.   0  . rosuvastatin (CRESTOR) 20 MG tablet   0  . sertraline (ZOLOFT) 100 MG tablet      Current Facility-Administered Medications on File Prior to Visit  Medication Dose Route Frequency Provider Last Rate Last Dose  . triamcinolone acetonide (KENALOG) 10 MG/ML injection 10 mg  10 mg Other Once Landis Martins, DPM        Allergies  Allergen Reactions  . Levofloxacin Other (See Comments)    confusion    Objective: There were no vitals filed for this visit.  General: No acute distress, AAOx3  To the left sub met 1 there is healed pre-ulcerative callus.  To the right foot there is a full-thickness ulcer present sub-met 1 that measures 1 x 0.5 cm with a granular base.  To the left heel that measures 1 cm width x1 cm in length x 1.5 cm probes to bone like before and undermines.  The left heel wound bed is granular with moderate periwound maceration, mild soft tissue swelling, no warmth no redness no surrounding signs of infection.  No pus from the open area at the left heel.  There is + pain  to left heel. Pain occasionally to 1st MTPJ bilateral. Neurovascular unchanged from prior.  Assessment and Plan:  Problem List Items Addressed This Visit      Nervous and Auditory   Diabetic polyneuropathy associated with type 2 diabetes mellitus ([place])   Dementia ([place])    Other Visit Diagnoses    Foot ulcer with fat layer exposed, left ([place])    -  Primary   Foot ulcer, right, limited to breakdown of skin ([place])       Acute osteomyelitis of foot ([place])       Pre-ulcerative calluses           -Patient seen and evaluated -Cleansed ulceration with wound cleanser then mechanically debrided right sub met 1 and left heel ulceration down to the level of subcutaneous fat to healthy bleeding granular tissue  with no purulence.  Hemostasis was achieved with manual pressure then applied Iodosorb on the right and gauze packing to the left covered with Band-Aid and applied offloading padding sub-met 1 bilateral  -Wound culture was obtained prior to dressing these areas and will call patient if any additional antibiotics are needed however preemptively resume Augmentin since she has been on this before per IDs recommendations -Advised patient again to refrain from walking barefoot -Advised patient to go to the ER or return to office if the wound worsens or if constitutional symptoms are present -Return to office in 3-4 weeks for follow-up evaluation or sooner if problems or issues arise.    Landis Martins, DPM

## 2018-11-13 LAB — WOUND CULTURE

## 2018-11-15 ENCOUNTER — Telehealth: Payer: Self-pay | Admitting: *Deleted

## 2018-11-15 NOTE — Telephone Encounter (Signed)
I informed pt's dtr, Cecille Rubin of Dr. Cannon Kettle 11/14/2018 4:58pm review of results and orders.

## 2018-11-15 NOTE — Telephone Encounter (Signed)
-----   Message from Landis Martins, [place] sent at 11/14/2018  4:58 PM EDT ----- Will you let patient's daughter know that normal skin flora was noted with some gram - bacteria. Continue with Augmentin until completed Thanks Dr. Cannon Kettle

## 2018-11-24 DIAGNOSIS — E113412 Type 2 diabetes mellitus with severe nonproliferative diabetic retinopathy with macular edema, left eye: Secondary | ICD-10-CM | POA: Diagnosis not present

## 2018-12-08 ENCOUNTER — Other Ambulatory Visit: Payer: Self-pay

## 2018-12-08 ENCOUNTER — Ambulatory Visit (INDEPENDENT_AMBULATORY_CARE_PROVIDER_SITE_OTHER): Payer: Medicare Other | Admitting: Sports Medicine

## 2018-12-08 ENCOUNTER — Encounter: Payer: Self-pay | Admitting: Sports Medicine

## 2018-12-08 VITALS — Temp 98.5°F | Resp 16

## 2018-12-08 DIAGNOSIS — L02619 Cutaneous abscess of unspecified foot: Secondary | ICD-10-CM

## 2018-12-08 DIAGNOSIS — L97522 Non-pressure chronic ulcer of other part of left foot with fat layer exposed: Secondary | ICD-10-CM

## 2018-12-08 DIAGNOSIS — L03119 Cellulitis of unspecified part of limb: Secondary | ICD-10-CM

## 2018-12-08 DIAGNOSIS — L97511 Non-pressure chronic ulcer of other part of right foot limited to breakdown of skin: Secondary | ICD-10-CM

## 2018-12-08 DIAGNOSIS — L97403 Non-pressure chronic ulcer of unspecified heel and midfoot with necrosis of muscle: Secondary | ICD-10-CM

## 2018-12-08 DIAGNOSIS — E08621 Diabetes mellitus due to underlying condition with foot ulcer: Secondary | ICD-10-CM

## 2018-12-08 DIAGNOSIS — M1000 Idiopathic gout, unspecified site: Secondary | ICD-10-CM

## 2018-12-08 MED ORDER — AMOXICILLIN-POT CLAVULANATE 500-125 MG PO TABS: 1.0000 | ORAL_TABLET | Freq: Two times a day (BID) | ORAL | 0 refills | Status: DC

## 2018-12-08 MED ORDER — COLCHICINE 0.6 MG PO TABS: 0.6000 mg | ORAL_TABLET | Freq: Every day | ORAL | 0 refills | Status: DC

## 2018-12-08 MED ORDER — FLUCONAZOLE 150 MG PO TABS: 150.0000 mg | ORAL_TABLET | Freq: Once | ORAL | 0 refills | Status: AC

## 2018-12-08 NOTE — Progress Notes (Addendum)
Subjective: Cindy Hahn is a 84 y.o. female patient seen today in office for follow-up evaluation of chronic left heel ulcer and bilateral foot ulcers. Patient is assisted by daughter who has been assisting with doing the wound care however this week she has been doing her own wound care because daughter has been out of town again like before and has not been available to help with the wound care. Patient denies pain except at heel and feels like the area is draining more from both the heel and the right foot as well.  Patient denies nausea, vomiting, fever, and chills.  No other issues noted.   Fasting blood sugar did not check for last A1c 11.9  Patient Active Problem List   Diagnosis Date Noted  . Osteomyelitis ([place]) 10/17/2018  . Diabetic foot ulcer ([place]) 05/05/2018  . History of CVA (cerebrovascular accident) 05/05/2018  . Dementia ([place]) 05/05/2018  . Diabetic polyneuropathy associated with type 2 diabetes mellitus ([place]) 06/04/2015  . Pacemaker 03/11/2015  . Bradycardia 02/06/2015  . Chronic atrial fibrillation 11/01/2014  . Essential hypertension 11/01/2014  . Diabetes mellitus ([place]) 11/01/2014    Current Outpatient Medications on File Prior to Visit  Medication Sig Dispense Refill  . ALPRAZolam (XANAX) 1 MG tablet Take 1 mg by mouth.    Marland Kitchen anastrozole (ARIMIDEX) 1 MG tablet take 1 tablet by mouth once daily to [place].  0  . aspirin EC 81 MG tablet Take 81 mg by mouth.    . BD INSULIN SYRINGE U/F 31G X 5/16" 1 ML MISC     . digoxin (LANOXIN) 0.125 MG tablet Take 0.0625 mg by mouth daily.   0  . diltiazem (CARDIZEM) 90 MG tablet Take 1 tablet (90 mg total) by mouth 2 (two) times daily. (Patient taking differently: Take 60 mg by mouth 2 (two) times daily. ) 60 tablet 6  . LEVEMIR 100 UNIT/ML injection Inject 74 Units into the skin.     . metoprolol tartrate (LOPRESSOR) 50 MG tablet Take 25 mg by mouth 2 (two) times daily.   0  . NOVOLOG FLEXPEN 100  UNIT/ML FlexPen inject 10 units SUBCUTANEOUSLY WITH MAIN MEAL  0  . potassium chloride SA (K-DUR,KLOR-CON) 20 MEQ tablet Take 20 mEq by mouth daily.   0  . rosuvastatin (CRESTOR) 20 MG tablet   0  . sertraline (ZOLOFT) 100 MG tablet      Current Facility-Administered Medications on File Prior to Visit  Medication Dose Route Frequency Provider Last Rate Last Dose  . triamcinolone acetonide (KENALOG) 10 MG/ML injection 10 mg  10 mg Other Once Landis Martins, DPM        Allergies  Allergen Reactions  . Levofloxacin Other (See Comments)    confusion    Objective: There were no vitals filed for this visit.  General: No acute distress, AAOx3  To the left sub met 1 there is healed pre-ulcerative callus.  To the right foot there is a full-thickness ulcer present sub-met 1 that measures 1 x 0.5 cm with a granular base similar to last time but with increasing redness swelling and warmth concerning for infection versus gout.  To the left heel that measures 1 cm width x1 cm in length x 1 cm probes to soft tissue in range area feels like it is filling in with a granular plug.  The left heel wound bed is granular with moderate periwound maceration, mild soft tissue swelling, no warmth no redness no surrounding signs of  infection.  No pus from the open area at the left heel.  There is + pain to left heel. Pain occasionally to 1st MTPJ bilateral. Neurovascular unchanged from prior.  Assessment and Plan:  Problem List Items Addressed This Visit      Endocrine   Diabetic foot ulcer ([place])   Relevant Medications   amoxicillin-clavulanate (AUGMENTIN) 500-125 MG tablet    Other Visit Diagnoses    Foot ulcer with fat layer exposed, left ([place])    -  Primary   Foot ulcer, right, limited to breakdown of skin ([place])       Cellulitis and abscess of foot, except toes       Idiopathic gout, unspecified chronicity, unspecified site           -Patient seen and evaluated -Previous wound culture results  reviewed and are negative -Cleansed ulceration with wound cleanser then mechanically debrided using a sterile tissue nipper right sub met 1 and left heel ulceration down to the level of subcutaneous fat to healthy bleeding granular tissue with no purulence.  Hemostasis was achieved with manual pressure then applied Iodosorb bilateral  -Reordered Augmentin because of the increased redness on the right foot also advised patient that this could possibly be gout so also order colchicine as well -Advised patient again to refrain from walking barefoot and to get rid of shoes that are old and worn -Advised patient to go to the ER or return to office if the wound worsens or if constitutional symptoms are present -Return to office in 3 weeks for follow-up evaluation or sooner if problems or issues arise.    Landis Martins, DPM

## 2018-12-28 ENCOUNTER — Ambulatory Visit: Payer: Medicare Other | Admitting: Cardiology

## 2018-12-28 ENCOUNTER — Other Ambulatory Visit: Payer: Self-pay

## 2018-12-28 ENCOUNTER — Ambulatory Visit (INDEPENDENT_AMBULATORY_CARE_PROVIDER_SITE_OTHER): Payer: Medicare Other | Admitting: Sports Medicine

## 2018-12-28 ENCOUNTER — Encounter: Payer: Self-pay | Admitting: Sports Medicine

## 2018-12-28 DIAGNOSIS — M1000 Idiopathic gout, unspecified site: Secondary | ICD-10-CM

## 2018-12-28 DIAGNOSIS — L97522 Non-pressure chronic ulcer of other part of left foot with fat layer exposed: Secondary | ICD-10-CM

## 2018-12-28 DIAGNOSIS — L97511 Non-pressure chronic ulcer of other part of right foot limited to breakdown of skin: Secondary | ICD-10-CM

## 2018-12-28 NOTE — Progress Notes (Signed)
Subjective: Cindy Hahn is a 84 y.o. female patient seen today in office for follow-up evaluation of chronic left heel ulcer and bilateral foot ulcers. Patient is assisted by daughter who has been assisting with doing the wound care however this week she has been doing her own wound care because has not been available to help with the wound care. Patient admits pain to heel and 1st MTPJ  Patient denies nausea, vomiting, fever, and chills.  No other issues noted.   Fasting blood sugar did not check for last A1c 11.9 like before.  Patient Active Problem List   Diagnosis Date Noted  . Osteomyelitis ([place]) 10/17/2018  . Diabetic foot ulcer ([employer]) 05/05/2018  . History of CVA (cerebrovascular accident) 05/05/2018  . Dementia ([place]) 05/05/2018  . Diabetic polyneuropathy associated with type 2 diabetes mellitus ([place]) 06/04/2015  . Pacemaker 03/11/2015  . Bradycardia 02/06/2015  . Chronic atrial fibrillation 11/01/2014  . Essential hypertension 11/01/2014  . Diabetes mellitus ([place]) 11/01/2014    Current Outpatient Medications on File Prior to Visit  Medication Sig Dispense Refill  . ALPRAZolam (XANAX) 1 MG tablet Take 1 mg by mouth.    Marland Kitchen amoxicillin-clavulanate (AUGMENTIN) 500-125 MG tablet Take 1 tablet (500 mg total) by mouth 2 (two) times daily. 28 tablet 0  . anastrozole (ARIMIDEX) 1 MG tablet take 1 tablet by mouth once daily to [place].  0  . aspirin EC 81 MG tablet Take 81 mg by mouth.    . BD INSULIN SYRINGE U/F 31G X 5/16" 1 ML MISC     . colchicine 0.6 MG tablet Take 1 tablet (0.6 mg total) by mouth daily. 7 tablet 0  . digoxin (LANOXIN) 0.125 MG tablet Take 0.0625 mg by mouth daily.   0  . diltiazem (CARDIZEM) 90 MG tablet Take 1 tablet (90 mg total) by mouth 2 (two) times daily. (Patient taking differently: Take 60 mg by mouth 2 (two) times daily. ) 60 tablet 6  . LEVEMIR 100 UNIT/ML injection Inject 74 Units into the skin.     . metoprolol tartrate  (LOPRESSOR) 50 MG tablet Take 25 mg by mouth 2 (two) times daily.   0  . NOVOLOG FLEXPEN 100 UNIT/ML FlexPen inject 10 units SUBCUTANEOUSLY WITH MAIN MEAL  0  . potassium chloride SA (K-DUR,KLOR-CON) 20 MEQ tablet Take 20 mEq by mouth daily.   0  . rosuvastatin (CRESTOR) 20 MG tablet   0  . sertraline (ZOLOFT) 100 MG tablet      Current Facility-Administered Medications on File Prior to Visit  Medication Dose Route Frequency Provider Last Rate Last Dose  . triamcinolone acetonide (KENALOG) 10 MG/ML injection 10 mg  10 mg Other Once Landis Martins, DPM        Allergies  Allergen Reactions  . Levofloxacin Other (See Comments)    confusion    Objective: There were no vitals filed for this visit.  General: No acute distress, AAOx3  To the left sub met 1 there is healed pre-ulcerative callus.  To the right foot there is a full-thickness ulcer present sub-met 1 that measures 0.8 x 0.5 cm with a granular base smaller than last time but with increasing redness swelling and warmth concerning for gout still.  To the left heel that measures 0.8 cm width x1 cm in length x 1 cm probes to soft tissue in range area feels like it is filling in with a granular plug.  The left heel wound bed is granular with  mild periwound maceration, mild soft tissue swelling, no warmth no redness no surrounding signs of infection.  No pus from the open area at the left heel.  There is + pain to left heel. Pain occasionally to 1st MTPJ bilateral. Neurovascular unchanged from prior.  Assessment and Plan:  Problem List Items Addressed This Visit    None    Visit Diagnoses    Foot ulcer with fat layer exposed, left ([place])    -  Primary   Foot ulcer, right, limited to breakdown of skin ([place])       Idiopathic gout, unspecified chronicity, unspecified site           -Patient seen and evaluated -Previous wound culture results reviewed and are negative -Cleansed ulceration with wound cleanser then mechanically debrided  using a sterile tissue nipper right sub met 1 and left heel ulceration down to the level of subcutaneous fat to healthy bleeding granular tissue with no purulence.  Hemostasis was achieved with manual pressure then applied Iodosorb and dry dressing and advised patient to do the same. -Advised patient to take Colchicine for possible gout pain at 1st MTPJ -Continue with good supportive shoes -Advised patient to go to the ER or return to office if the wound worsens or if constitutional symptoms are present -Return to office in 3-4 weeks for follow-up evaluation or sooner if problems or issues arise.    Landis Martins, DPM

## 2019-01-05 DIAGNOSIS — E113412 Type 2 diabetes mellitus with severe nonproliferative diabetic retinopathy with macular edema, left eye: Secondary | ICD-10-CM | POA: Diagnosis not present

## 2019-01-11 ENCOUNTER — Other Ambulatory Visit: Payer: Self-pay | Admitting: Sports Medicine

## 2019-01-11 ENCOUNTER — Telehealth: Payer: Self-pay | Admitting: Sports Medicine

## 2019-01-11 DIAGNOSIS — E08621 Diabetes mellitus due to underlying condition with foot ulcer: Secondary | ICD-10-CM

## 2019-01-11 DIAGNOSIS — L97403 Non-pressure chronic ulcer of unspecified heel and midfoot with necrosis of muscle: Secondary | ICD-10-CM

## 2019-01-11 MED ORDER — AMOXICILLIN-POT CLAVULANATE 500-125 MG PO TABS: 1.0000 | ORAL_TABLET | Freq: Two times a day (BID) | ORAL | 0 refills | Status: DC

## 2019-01-11 NOTE — Telephone Encounter (Signed)
pts daughter Cecille Rubin called asking if you could call in a rx for an antibiotic for the pt it the opposite foot that is having the issue.They use [facility] pharmacy on [address]

## 2019-01-11 NOTE — Progress Notes (Signed)
Abx refilled

## 2019-01-16 ENCOUNTER — Other Ambulatory Visit: Payer: Self-pay

## 2019-01-16 ENCOUNTER — Encounter: Payer: Self-pay | Admitting: Cardiology

## 2019-01-16 ENCOUNTER — Ambulatory Visit (INDEPENDENT_AMBULATORY_CARE_PROVIDER_SITE_OTHER): Payer: Medicare Other | Admitting: Cardiology

## 2019-01-16 VITALS — BP 126/74 | HR 106 | Ht 65.0 in | Wt 135.0 lb

## 2019-01-16 DIAGNOSIS — I482 Chronic atrial fibrillation, unspecified: Secondary | ICD-10-CM | POA: Diagnosis not present

## 2019-01-16 DIAGNOSIS — Z950 Presence of cardiac pacemaker: Secondary | ICD-10-CM | POA: Diagnosis not present

## 2019-01-16 LAB — CUP PACEART INCLINIC DEVICE CHECK
Battery Impedance: 546 Ohm
Battery Remaining Longevity: 89 mo
Battery Voltage: 2.77 V
Brady Statistic RV Percent Paced: 14 %
Date Time Interrogation Session: 20200928145610
Implantable Lead Implant Date: 20161027
Implantable Lead Location: 753860
Implantable Lead Model: 4092
Implantable Pulse Generator Implant Date: 20161027
Lead Channel Impedance Value: 0 Ohm
Lead Channel Impedance Value: 620 Ohm
Lead Channel Pacing Threshold Amplitude: 0.5 V
Lead Channel Pacing Threshold Pulse Width: 0.4 ms
Lead Channel Sensing Intrinsic Amplitude: 4 mV
Lead Channel Setting Pacing Amplitude: 2 V
Lead Channel Setting Pacing Pulse Width: 0.4 ms
Lead Channel Setting Sensing Sensitivity: 2 mV

## 2019-01-16 MED ORDER — DILTIAZEM HCL 90 MG PO TABS: 90.0000 mg | ORAL_TABLET | Freq: Two times a day (BID) | ORAL | 11 refills | Status: DC

## 2019-01-16 NOTE — Progress Notes (Signed)
Electrophysiology Office Note   Date:  01/16/2019   ID:  Cindy Hahn, DOB [birth year], MRN [id number]  PCP:  Cindy Hipps, MD  Cardiologist:  Cindy Hahn Primary Electrophysiologist:   Cindy Alarid Meredith Leeds, MD    No chief complaint on file.    History of Present Illness: Cindy Hahn is a 84 y.o. female who is being seen today for the evaluation of pacemaker at the request of Cindy Hahn. Presenting today for electrophysiology evaluation.  Has a history of chronic atrial fibrillation, diabetes, hypertension, hyperlipidemia, CVA, and dementia.  She has a Medtronic pacemaker.  Today, denies symptoms of chest pain, shortness of breath, orthopnea, PND, lower extremity edema, claudication, dizziness, presyncope, syncope, bleeding, or neurologic sequela. The patient is tolerating medications without difficulties.  Atrial fibrillation moderately rapid rates.  She does note a few episodic palpitations.   Past Medical History:  Diagnosis Date  . A-fib ([place])   . Cancer ([place])   . Dementia ([place])   . Diabetes mellitus without complication ([place])   . Hyperlipidemia   . Hypertension   . Stroke [facility])    Past Surgical History:  Procedure Laterality Date  . ABDOMINAL HYSTERECTOMY    . BLADDER SURGERY    . CATARACT EXTRACTION    . ILEOCECETOMY    . KNEE SURGERY    . OOPHORECTOMY    . SHOULDER SURGERY    . WRIST SURGERY       Current Outpatient Medications  Medication Sig Dispense Refill  . ALPRAZolam (XANAX) 1 MG tablet Take 1 mg by mouth.    Marland Kitchen amoxicillin-clavulanate (AUGMENTIN) 500-125 MG tablet Take 1 tablet (500 mg total) by mouth 2 (two) times daily. 28 tablet 0  . aspirin EC 81 MG tablet Take 81 mg by mouth.    . BD INSULIN SYRINGE U/F 31G X 5/16" 1 ML MISC     . digoxin (LANOXIN) 0.125 MG tablet Take 0.0625 mg by mouth daily.   0  . LEVEMIR 100 UNIT/ML injection Inject 74 Units into the skin.     . metoprolol tartrate (LOPRESSOR) 50 MG tablet Take 75 mg by mouth  2 (two) times daily.   0  . NOVOLOG FLEXPEN 100 UNIT/ML FlexPen inject 10 units SUBCUTANEOUSLY WITH MAIN MEAL  0  . potassium chloride SA (K-DUR,KLOR-CON) 20 MEQ tablet Take 20 mEq by mouth daily.   0  . rosuvastatin (CRESTOR) 20 MG tablet   0  . sertraline (ZOLOFT) 100 MG tablet     . diltiazem (CARDIZEM) 90 MG tablet Take 1 tablet (90 mg total) by mouth 2 (two) times daily. 60 tablet 11   Current Facility-Administered Medications  Medication Dose Route Frequency Provider Last Rate Last Dose  . triamcinolone acetonide (KENALOG) 10 MG/ML injection 10 mg  10 mg Other Once Cindy Hahn, DPM        Allergies:   Levofloxacin   Social History:  The patient  reports that she has quit smoking. She has never used smokeless tobacco. She reports that she does not drink alcohol or use drugs.   Family History:  The patient's family history includes Cancer in her father; Cardiomyopathy in her mother; Diabetes in her mother; Heart disease in her father; Tuberculosis in her mother.    ROS:  Please see the history of present illness.   Otherwise, review of systems is positive for none.   All other systems are reviewed and negative.   PHYSICAL EXAM: VS:  BP 126/74   Pulse Marland Kitchen)  106   Ht 5\' 5"  (1.651 m)   Wt 135 lb (61.2 kg)   BMI 22.47 kg/m  , BMI Body mass index is 22.47 kg/m. GEN: Well nourished, well developed, in no acute distress  HEENT: normal  Neck: no JVD, carotid bruits, or masses Cardiac: RRR; no murmurs, rubs, or gallops,no edema  Respiratory:  clear to auscultation bilaterally, normal work of breathing GI: soft, nontender, nondistended, + BS MS: no deformity or atrophy  Skin: warm and dry, device site well healed Neuro:  Strength and sensation are intact Psych: euthymic mood, full affect  EKG:  EKG is ordered today. Personal review of the ekg ordered shows AF, PVC  Personal review of the device interrogation today. Results in [place]: 08/30/2018: BUN 27;  Creat 1.41; Hemoglobin 14.0; Platelets 173; Potassium 5.0; Sodium 136    Lipid Panel  No results found for: CHOL, TRIG, HDL, CHOLHDL, VLDL, LDLCALC, LDLDIRECT   Wt Readings from Last 3 Encounters:  01/16/19 135 lb (61.2 kg)  08/30/18 136 lb (61.7 kg)  05/30/18 138 lb (62.6 kg)      Other studies Reviewed: Additional studies/ records that were reviewed today include: TTE 04/07/18  Review of the above records today demonstrates:  - Left ventricle: The cavity size was normal. Systolic function was   normal. The estimated ejection fraction was in the range of 50%   to 55%. Wall motion was normal; there were no regional wall   motion abnormalities. - Aortic valve: Trileaflet; mildly thickened, mildly calcified   leaflets. Transvalvular velocity was within the normal range.   There was no stenosis. - Mitral valve: Mildly thickened and calcified leaflets . There was   mild regurgitation. - Left atrium: The atrium was moderately dilated.   ASSESSMENT AND PLAN:  1.  Chronic atrial fibrillation: Currently not anticoagulated due to frequent falls.  Heart rates are mildly elevated today.  May have also been elevated based on device checks.  We Cindy Hahn thus increase diltiazem.  This patients CHA2DS2-VASc Score and unadjusted Ischemic Stroke Rate (% per year) is equal to 9.7 % stroke rate/year from a score of 6  Above score calculated as 1 point each if present [CHF, HTN, DM, Vascular=MI/PAD/Aortic Plaque, Age if 65-74, or Female] Above score calculated as 2 points each if present [Age > 75, or Stroke/TIA/TE]   2.  Hypertension: Currently well controlled  3.  Hyperlipidemia: Crestor per primary cardiology  4.  Atrial fibrillation: Status post Medtronic dual-chamber pacemaker.  Device functioning appropriately.  No changes.    Current medicines are reviewed at length with the patient today.   The patient does not have concerns regarding her medicines.  The following changes were made  today: Increase diltiazem  Labs/ tests ordered today include:  Orders Placed This Encounter  Procedures  . EKG 12-Lead     Disposition:   FU with Cindy Hahn 12 months  Signed, Cindy Jenning Meredith Leeds, MD  01/16/2019 1:18 PM     [place] [address] [phone] (office) [phone] (fax)

## 2019-01-16 NOTE — Patient Instructions (Addendum)
Medication Instructions:  Your physician has recommended you make the following change in your medication:  1. INCREASE Diltiazem to 90 mg twice a day  *If you need a refill on your cardiac medications before your next appointment, please call your pharmacy*  Labwork: None ordered  Testing/Procedures: None ordered  Follow-Up: Remote monitoring is used to monitor your Pacemaker or ICD from home. This monitoring reduces the number of office visits required to check your device to one time per year. It allows [place] to keep an eye on the functioning of your device to ensure it is working properly. You are scheduled for a device check from home on 04/17/19. You may send your transmission at any time that day. If you have a wireless device, the transmission will be sent automatically. After your physician reviews your transmission, you will receive a postcard with your next transmission date.  Your physician wants you to follow-up in: 1 year with Dr. Curt Bears.  You will receive a reminder letter in the mail two months in advance. If you don't receive a letter, please call our office to schedule the follow-up appointment.  Thank you for choosing [facility]!!   Trinidad Curet, RN [phone]

## 2019-01-19 ENCOUNTER — Ambulatory Visit (INDEPENDENT_AMBULATORY_CARE_PROVIDER_SITE_OTHER): Payer: Medicare Other | Admitting: Sports Medicine

## 2019-01-19 ENCOUNTER — Other Ambulatory Visit: Payer: Self-pay

## 2019-01-19 DIAGNOSIS — L97522 Non-pressure chronic ulcer of other part of left foot with fat layer exposed: Secondary | ICD-10-CM

## 2019-01-19 DIAGNOSIS — L97512 Non-pressure chronic ulcer of other part of right foot with fat layer exposed: Secondary | ICD-10-CM

## 2019-01-19 DIAGNOSIS — L02619 Cutaneous abscess of unspecified foot: Secondary | ICD-10-CM

## 2019-01-19 DIAGNOSIS — E1142 Type 2 diabetes mellitus with diabetic polyneuropathy: Secondary | ICD-10-CM

## 2019-01-19 DIAGNOSIS — L03119 Cellulitis of unspecified part of limb: Secondary | ICD-10-CM

## 2019-01-19 DIAGNOSIS — F0150 Vascular dementia without behavioral disturbance: Secondary | ICD-10-CM

## 2019-01-19 MED ORDER — SILVER NITRATE-POT NITRATE 75-25 % EX MISC: CUTANEOUS | 0 refills | Status: AC

## 2019-01-19 NOTE — Progress Notes (Signed)
Subjective: Cindy Hahn is a 84 y.o. female patient seen today in office for follow-up evaluation of chronic foot ulcers. Patient is assisted by daughter who has been assisting with doing the wound care sometimes. Patient admits pain to heel and 1st MTPJ  With the areas feeling sore. Reports that the colchicine did not help. Patient denies nausea, vomiting, fever, and chills.  No other issues noted.   Fasting blood sugar did not check meter broken  Patient Active Problem List   Diagnosis Date Noted  . Osteomyelitis ([place]) 10/17/2018  . Diabetic foot ulcer ([place]) 05/05/2018  . History of CVA (cerebrovascular accident) 05/05/2018  . Dementia ([place]) 05/05/2018  . Diabetic polyneuropathy associated with type 2 diabetes mellitus ([place]) 06/04/2015  . Pacemaker 03/11/2015  . Bradycardia 02/06/2015  . Chronic atrial fibrillation ([place]) 11/01/2014  . Essential hypertension 11/01/2014  . Diabetes mellitus ([place]) 11/01/2014    Current Outpatient Medications on File Prior to Visit  Medication Sig Dispense Refill  . ALPRAZolam (XANAX) 1 MG tablet Take 1 mg by mouth.    Marland Kitchen amoxicillin-clavulanate (AUGMENTIN) 500-125 MG tablet Take 1 tablet (500 mg total) by mouth 2 (two) times daily. 28 tablet 0  . aspirin EC 81 MG tablet Take 81 mg by mouth.    . BD INSULIN SYRINGE U/F 31G X 5/16" 1 ML MISC     . digoxin (LANOXIN) 0.125 MG tablet Take 0.0625 mg by mouth daily.   0  . diltiazem (CARDIZEM) 90 MG tablet Take 1 tablet (90 mg total) by mouth 2 (two) times daily. 60 tablet 11  . LEVEMIR 100 UNIT/ML injection Inject 74 Units into the skin.     . metoprolol tartrate (LOPRESSOR) 50 MG tablet Take 75 mg by mouth 2 (two) times daily.   0  . NOVOLOG FLEXPEN 100 UNIT/ML FlexPen inject 10 units SUBCUTANEOUSLY WITH MAIN MEAL  0  . potassium chloride SA (K-DUR,KLOR-CON) 20 MEQ tablet Take 20 mEq by mouth daily.   0  . rosuvastatin (CRESTOR) 20 MG tablet   0  . sertraline (ZOLOFT) 100 MG tablet      Current  Facility-Administered Medications on File Prior to Visit  Medication Dose Route Frequency Provider Last Rate Last Dose  . triamcinolone acetonide (KENALOG) 10 MG/ML injection 10 mg  10 mg Other Once Landis Martins, DPM        Allergies  Allergen Reactions  . Levofloxacin Other (See Comments)    confusion    Objective: There were no vitals filed for this visit.  General: No acute distress, AAOx3  To the left sub met 1 there is healed pre-ulcerative callus.  To the right foot there is a full-thickness ulcer present sub-met 1 that measures 0.6 x 0.5x0.5 cm with a granular base smaller than last time but with increasing redness swelling and warmth concerning for gout still.  To the left heel that measures 0.6 cm width x0.8 cm in length x 1 cm probes to soft tissue in range area feels like it is filling in with a granular plug like before.  The left heel wound bed is granular with mild periwound maceration, mild soft tissue swelling, no warmth no redness no surrounding signs of infection.  No pus from the open area at the left heel.  There is + pain to left heel. Pain occasionally to 1st MTPJ bilateral. Neurovascular unchanged from prior.  Assessment and Plan:  Problem List Items Addressed This Visit      Endocrine   Diabetic polyneuropathy associated  with type 2 diabetes mellitus ([place])     Nervous and Auditory   Dementia ([place])    Other Visit Diagnoses    Foot ulcer with fat layer exposed, left ([place])    -  Primary   Foot ulcer, right, with fat layer exposed ([place])       Cellulitis and abscess of foot, except toes           -Patient seen and evaluated -Previous wound culture results reviewed and are negative -Cleansed ulceration with wound cleanser then mechanically debrided using a sterile tissue nipper right sub met 1 and left heel ulceration down to the level of subcutaneous fat to healthy bleeding granular tissue with no purulence.  Hemostasis was achieved with manual pressure then  applied silver nitrate and dry dressing and advised patient to do the same. -Added arch pads bilateral and advised patient to continue with diabetic shoes -Advised patient to go to the ER or return to office if the wound worsens or if constitutional symptoms are present -Return to office in 3-4 weeks for follow-up evaluation or sooner if problems or issues arise.    Landis Martins, DPM

## 2019-01-25 ENCOUNTER — Other Ambulatory Visit: Payer: Self-pay | Admitting: Sports Medicine

## 2019-01-25 DIAGNOSIS — L03119 Cellulitis of unspecified part of limb: Secondary | ICD-10-CM

## 2019-01-25 DIAGNOSIS — L97522 Non-pressure chronic ulcer of other part of left foot with fat layer exposed: Secondary | ICD-10-CM

## 2019-01-25 DIAGNOSIS — L02619 Cutaneous abscess of unspecified foot: Secondary | ICD-10-CM

## 2019-01-25 DIAGNOSIS — L97512 Non-pressure chronic ulcer of other part of right foot with fat layer exposed: Secondary | ICD-10-CM

## 2019-01-25 MED ORDER — CIPROFLOXACIN HCL 500 MG PO TABS: 500.0000 mg | ORAL_TABLET | Freq: Two times a day (BID) | ORAL | 0 refills | Status: DC

## 2019-01-25 NOTE — Progress Notes (Signed)
Sent Cipro for cellulitis of foot and advised daughter to closely monitor if worsens to report to ER. Patient to follow up in office on next Wednesday for follow up evaluation. -Dr. Cannon Kettle

## 2019-01-27 ENCOUNTER — Encounter: Payer: Self-pay | Admitting: Sports Medicine

## 2019-01-27 ENCOUNTER — Other Ambulatory Visit: Payer: Self-pay | Admitting: Sports Medicine

## 2019-01-27 ENCOUNTER — Ambulatory Visit (INDEPENDENT_AMBULATORY_CARE_PROVIDER_SITE_OTHER): Payer: Medicare Other | Admitting: Sports Medicine

## 2019-01-27 ENCOUNTER — Other Ambulatory Visit: Payer: Self-pay

## 2019-01-27 ENCOUNTER — Ambulatory Visit (INDEPENDENT_AMBULATORY_CARE_PROVIDER_SITE_OTHER): Payer: Medicare Other

## 2019-01-27 DIAGNOSIS — Z932 Ileostomy status: Secondary | ICD-10-CM | POA: Diagnosis not present

## 2019-01-27 DIAGNOSIS — Z03818 Encounter for observation for suspected exposure to other biological agents ruled out: Secondary | ICD-10-CM | POA: Diagnosis not present

## 2019-01-27 DIAGNOSIS — N183 Chronic kidney disease, stage 3 unspecified: Secondary | ICD-10-CM | POA: Diagnosis present

## 2019-01-27 DIAGNOSIS — M79672 Pain in left foot: Secondary | ICD-10-CM

## 2019-01-27 DIAGNOSIS — L02619 Cutaneous abscess of unspecified foot: Secondary | ICD-10-CM | POA: Diagnosis not present

## 2019-01-27 DIAGNOSIS — E1142 Type 2 diabetes mellitus with diabetic polyneuropathy: Secondary | ICD-10-CM | POA: Diagnosis not present

## 2019-01-27 DIAGNOSIS — E11649 Type 2 diabetes mellitus with hypoglycemia without coma: Secondary | ICD-10-CM | POA: Diagnosis not present

## 2019-01-27 DIAGNOSIS — E1165 Type 2 diabetes mellitus with hyperglycemia: Secondary | ICD-10-CM | POA: Diagnosis not present

## 2019-01-27 DIAGNOSIS — M7732 Calcaneal spur, left foot: Secondary | ICD-10-CM | POA: Diagnosis not present

## 2019-01-27 DIAGNOSIS — M19072 Primary osteoarthritis, left ankle and foot: Secondary | ICD-10-CM | POA: Diagnosis not present

## 2019-01-27 DIAGNOSIS — L97421 Non-pressure chronic ulcer of left heel and midfoot limited to breakdown of skin: Secondary | ICD-10-CM | POA: Diagnosis not present

## 2019-01-27 DIAGNOSIS — L03116 Cellulitis of left lower limb: Secondary | ICD-10-CM | POA: Diagnosis not present

## 2019-01-27 DIAGNOSIS — G9389 Other specified disorders of brain: Secondary | ICD-10-CM | POA: Diagnosis not present

## 2019-01-27 DIAGNOSIS — R7989 Other specified abnormal findings of blood chemistry: Secondary | ICD-10-CM | POA: Diagnosis not present

## 2019-01-27 DIAGNOSIS — F418 Other specified anxiety disorders: Secondary | ICD-10-CM | POA: Diagnosis present

## 2019-01-27 DIAGNOSIS — F0390 Unspecified dementia without behavioral disturbance: Secondary | ICD-10-CM | POA: Diagnosis present

## 2019-01-27 DIAGNOSIS — L97522 Non-pressure chronic ulcer of other part of left foot with fat layer exposed: Secondary | ICD-10-CM | POA: Diagnosis not present

## 2019-01-27 DIAGNOSIS — Z794 Long term (current) use of insulin: Secondary | ICD-10-CM | POA: Diagnosis not present

## 2019-01-27 DIAGNOSIS — L03119 Cellulitis of unspecified part of limb: Secondary | ICD-10-CM

## 2019-01-27 DIAGNOSIS — M25774 Osteophyte, right foot: Secondary | ICD-10-CM | POA: Diagnosis not present

## 2019-01-27 DIAGNOSIS — M19071 Primary osteoarthritis, right ankle and foot: Secondary | ICD-10-CM | POA: Diagnosis not present

## 2019-01-27 DIAGNOSIS — M86172 Other acute osteomyelitis, left ankle and foot: Secondary | ICD-10-CM | POA: Diagnosis not present

## 2019-01-27 DIAGNOSIS — M25772 Osteophyte, left ankle: Secondary | ICD-10-CM | POA: Diagnosis not present

## 2019-01-27 DIAGNOSIS — Z853 Personal history of malignant neoplasm of breast: Secondary | ICD-10-CM | POA: Diagnosis not present

## 2019-01-27 DIAGNOSIS — E1122 Type 2 diabetes mellitus with diabetic chronic kidney disease: Secondary | ICD-10-CM | POA: Diagnosis present

## 2019-01-27 DIAGNOSIS — L97426 Non-pressure chronic ulcer of left heel and midfoot with bone involvement without evidence of necrosis: Secondary | ICD-10-CM | POA: Diagnosis not present

## 2019-01-27 DIAGNOSIS — Z20828 Contact with and (suspected) exposure to other viral communicable diseases: Secondary | ICD-10-CM | POA: Diagnosis present

## 2019-01-27 DIAGNOSIS — I361 Nonrheumatic tricuspid (valve) insufficiency: Secondary | ICD-10-CM | POA: Diagnosis not present

## 2019-01-27 DIAGNOSIS — L97529 Non-pressure chronic ulcer of other part of left foot with unspecified severity: Secondary | ICD-10-CM | POA: Diagnosis present

## 2019-01-27 DIAGNOSIS — M1990 Unspecified osteoarthritis, unspecified site: Secondary | ICD-10-CM | POA: Diagnosis present

## 2019-01-27 DIAGNOSIS — L97512 Non-pressure chronic ulcer of other part of right foot with fat layer exposed: Secondary | ICD-10-CM

## 2019-01-27 DIAGNOSIS — E11621 Type 2 diabetes mellitus with foot ulcer: Secondary | ICD-10-CM | POA: Diagnosis not present

## 2019-01-27 DIAGNOSIS — F0150 Vascular dementia without behavioral disturbance: Secondary | ICD-10-CM | POA: Diagnosis not present

## 2019-01-27 DIAGNOSIS — Z8673 Personal history of transient ischemic attack (TIA), and cerebral infarction without residual deficits: Secondary | ICD-10-CM | POA: Diagnosis not present

## 2019-01-27 DIAGNOSIS — Z79899 Other long term (current) drug therapy: Secondary | ICD-10-CM | POA: Diagnosis not present

## 2019-01-27 DIAGNOSIS — L03115 Cellulitis of right lower limb: Secondary | ICD-10-CM | POA: Diagnosis not present

## 2019-01-27 DIAGNOSIS — E875 Hyperkalemia: Secondary | ICD-10-CM | POA: Diagnosis not present

## 2019-01-27 DIAGNOSIS — I4891 Unspecified atrial fibrillation: Secondary | ICD-10-CM | POA: Diagnosis not present

## 2019-01-27 DIAGNOSIS — M86171 Other acute osteomyelitis, right ankle and foot: Secondary | ICD-10-CM | POA: Diagnosis present

## 2019-01-27 DIAGNOSIS — R110 Nausea: Secondary | ICD-10-CM

## 2019-01-27 DIAGNOSIS — I340 Nonrheumatic mitral (valve) insufficiency: Secondary | ICD-10-CM | POA: Diagnosis not present

## 2019-01-27 DIAGNOSIS — R739 Hyperglycemia, unspecified: Secondary | ICD-10-CM | POA: Diagnosis not present

## 2019-01-27 DIAGNOSIS — Z9889 Other specified postprocedural states: Secondary | ICD-10-CM | POA: Diagnosis not present

## 2019-01-27 DIAGNOSIS — I129 Hypertensive chronic kidney disease with stage 1 through stage 4 chronic kidney disease, or unspecified chronic kidney disease: Secondary | ICD-10-CM | POA: Diagnosis present

## 2019-01-27 DIAGNOSIS — R0602 Shortness of breath: Secondary | ICD-10-CM | POA: Diagnosis not present

## 2019-01-27 DIAGNOSIS — E119 Type 2 diabetes mellitus without complications: Secondary | ICD-10-CM | POA: Diagnosis not present

## 2019-01-27 DIAGNOSIS — L97429 Non-pressure chronic ulcer of left heel and midfoot with unspecified severity: Secondary | ICD-10-CM | POA: Diagnosis not present

## 2019-01-27 NOTE — Addendum Note (Signed)
Addended by: Allean Found on: 01/27/2019 01:33 PM   Modules accepted: Orders

## 2019-01-27 NOTE — Progress Notes (Signed)
Subjective: Cindy Hahn is a 84 y.o. female patient seen today in office for follow-up evaluation of chronic foot ulcers. Patient is assisted by daughter who reports that the cellulitis is not getting any better has even been taking the Cipro antibiotic without any improvement over the last 48 hours redness on the right has increased and she reports that she saw some pus from the area.  Daughter reports that her mom is also feeling very nauseous and is weak and complaining of pain and he was coming by office today to see if she needs to take her mother to the hospital. No other issues noted.   Fasting blood sugar did not check meter broken  Review of Systems  Constitutional: Positive for malaise/fatigue.       Nausea  All other systems reviewed and are negative.    Patient Active Problem List   Diagnosis Date Noted  . Osteomyelitis ([place]) 10/17/2018  . Diabetic foot ulcer ([place]) 05/05/2018  . History of CVA (cerebrovascular accident) 05/05/2018  . Dementia ([place]) 05/05/2018  . Diabetic polyneuropathy associated with type 2 diabetes mellitus ([place]) 06/04/2015  . Pacemaker 03/11/2015  . Bradycardia 02/06/2015  . Chronic atrial fibrillation ([place]) 11/01/2014  . Essential hypertension 11/01/2014  . Diabetes mellitus ([place]) 11/01/2014    Current Outpatient Medications on File Prior to Visit  Medication Sig Dispense Refill  . ALPRAZolam (XANAX) 1 MG tablet Take 1 mg by mouth.    Marland Kitchen amoxicillin-clavulanate (AUGMENTIN) 500-125 MG tablet Take 1 tablet (500 mg total) by mouth 2 (two) times daily. 28 tablet 0  . aspirin EC 81 MG tablet Take 81 mg by mouth.    . BD INSULIN SYRINGE U/F 31G X 5/16" 1 ML MISC     . ciprofloxacin (CIPRO) 500 MG tablet Take 1 tablet (500 mg total) by mouth 2 (two) times daily. 20 tablet 0  . digoxin (LANOXIN) 0.125 MG tablet Take 0.0625 mg by mouth daily.   0  . diltiazem (CARDIZEM) 90 MG tablet Take 1 tablet (90 mg total) by mouth 2 (two) times daily. 60 tablet 11   . LEVEMIR 100 UNIT/ML injection Inject 74 Units into the skin.     . metoprolol tartrate (LOPRESSOR) 50 MG tablet Take 75 mg by mouth 2 (two) times daily.   0  . NOVOLOG FLEXPEN 100 UNIT/ML FlexPen inject 10 units SUBCUTANEOUSLY WITH MAIN MEAL  0  . potassium chloride SA (K-DUR,KLOR-CON) 20 MEQ tablet Take 20 mEq by mouth daily.   0  . rosuvastatin (CRESTOR) 20 MG tablet   0  . sertraline (ZOLOFT) 100 MG tablet     . silver nitrate applicators 85-92 % applicator Apply to wound every other day then cover with guaze and papertape 100 each 0   Current Facility-Administered Medications on File Prior to Visit  Medication Dose Route Frequency Provider Last Rate Last Dose  . triamcinolone acetonide (KENALOG) 10 MG/ML injection 10 mg  10 mg Other Once Landis Martins, DPM        Allergies  Allergen Reactions  . Levofloxacin Other (See Comments)    confusion    Objective: There were no vitals filed for this visit.  General: No acute distress, AAOx3  To the left sub met 1 there is healed pre-ulcerative callus.  To the right foot there is a full-thickness ulcer present sub-met 1 that measures 0.8 x 0.8 x0.5 cm with a granular base larger than last time but with increasing redness swelling and warmth concerning for cellulitis now  to the level of the midfoot.  To the left heel that measures 0.8cm width x0.8 cm in length x 1 cm probes to soft tissue in range area feels like it is filling in with a granular plug like before.  The left heel wound bed is granular with mild periwound maceration, mild soft tissue swelling, no warmth no redness no surrounding signs of infection.  No pus from the open area at the left heel or plantar sub-met 1 on right.  There is + pain to left heel and sub-met 1 on right.Neurovascular unchanged from prior.  Assessment and Plan:  Problem List Items Addressed This Visit      Endocrine   Diabetic polyneuropathy associated with type 2 diabetes mellitus ([place])     Nervous and  Auditory   Dementia ([place])    Other Visit Diagnoses    Cellulitis and abscess of foot, except toes    -  Primary   Foot ulcer, right, with fat layer exposed ([place])       Foot ulcer with fat layer exposed, left ([place])       Nausea           -Patient seen and evaluated -x-rays reviewed consistent on the right with severe arthritis hard to tell if there is any sesamoid involvement or osteomyelitis due to diffuse arthritis in the area -Wound culture was obtained from plantar sub-met 1 on right -Cleansed ulceration with wound cleanser then mechanically debrided using a sterile tissue nipper right sub met 1 and left heel ulceration down to the level of subcutaneous fat to healthy bleeding granular tissue with no purulence was noted but there was multiple pieces of mushy tissue removed from the area and it clear to me greenish drainage.  Hemostasis was achieved with manual pressure then applied packing and dry dressing and advised patient to go to ER for IV antibiotics; called and discussed case with Dr. Yancey Flemings who is expecting her arrival for them.  IV antibiotics as well as blood work and blood cultures with CT scan if all are negative patient may be discharged home on oral however if anything is positive recommend admission -Return to office after ER evaluation or sooner if problems or issues arise.    Landis Martins, DPM

## 2019-01-28 DIAGNOSIS — M25772 Osteophyte, left ankle: Secondary | ICD-10-CM | POA: Diagnosis not present

## 2019-01-28 DIAGNOSIS — R7989 Other specified abnormal findings of blood chemistry: Secondary | ICD-10-CM | POA: Diagnosis not present

## 2019-01-28 DIAGNOSIS — R739 Hyperglycemia, unspecified: Secondary | ICD-10-CM | POA: Diagnosis not present

## 2019-01-28 DIAGNOSIS — M25774 Osteophyte, right foot: Secondary | ICD-10-CM | POA: Diagnosis not present

## 2019-01-28 DIAGNOSIS — L03115 Cellulitis of right lower limb: Secondary | ICD-10-CM | POA: Diagnosis not present

## 2019-01-28 DIAGNOSIS — M7732 Calcaneal spur, left foot: Secondary | ICD-10-CM | POA: Diagnosis not present

## 2019-01-28 DIAGNOSIS — M86172 Other acute osteomyelitis, left ankle and foot: Secondary | ICD-10-CM | POA: Diagnosis not present

## 2019-01-29 DIAGNOSIS — L03115 Cellulitis of right lower limb: Secondary | ICD-10-CM | POA: Diagnosis not present

## 2019-01-29 DIAGNOSIS — R739 Hyperglycemia, unspecified: Secondary | ICD-10-CM | POA: Diagnosis not present

## 2019-01-29 DIAGNOSIS — I4891 Unspecified atrial fibrillation: Secondary | ICD-10-CM

## 2019-01-29 DIAGNOSIS — M86172 Other acute osteomyelitis, left ankle and foot: Secondary | ICD-10-CM | POA: Diagnosis not present

## 2019-01-29 DIAGNOSIS — R7989 Other specified abnormal findings of blood chemistry: Secondary | ICD-10-CM | POA: Diagnosis not present

## 2019-01-30 ENCOUNTER — Other Ambulatory Visit: Payer: Self-pay | Admitting: Sports Medicine

## 2019-01-30 DIAGNOSIS — M86172 Other acute osteomyelitis, left ankle and foot: Secondary | ICD-10-CM | POA: Diagnosis not present

## 2019-01-30 DIAGNOSIS — R7989 Other specified abnormal findings of blood chemistry: Secondary | ICD-10-CM | POA: Diagnosis not present

## 2019-01-30 DIAGNOSIS — R739 Hyperglycemia, unspecified: Secondary | ICD-10-CM | POA: Diagnosis not present

## 2019-01-30 DIAGNOSIS — L03115 Cellulitis of right lower limb: Secondary | ICD-10-CM | POA: Diagnosis not present

## 2019-01-30 DIAGNOSIS — L03119 Cellulitis of unspecified part of limb: Secondary | ICD-10-CM

## 2019-01-30 DIAGNOSIS — L02619 Cutaneous abscess of unspecified foot: Secondary | ICD-10-CM

## 2019-01-30 LAB — WOUND CULTURE: Organism ID, Bacteria: NONE SEEN

## 2019-01-31 DIAGNOSIS — R739 Hyperglycemia, unspecified: Secondary | ICD-10-CM | POA: Diagnosis not present

## 2019-01-31 DIAGNOSIS — R7989 Other specified abnormal findings of blood chemistry: Secondary | ICD-10-CM | POA: Diagnosis not present

## 2019-01-31 DIAGNOSIS — M86172 Other acute osteomyelitis, left ankle and foot: Secondary | ICD-10-CM | POA: Diagnosis not present

## 2019-01-31 DIAGNOSIS — L03115 Cellulitis of right lower limb: Secondary | ICD-10-CM | POA: Diagnosis not present

## 2019-02-01 ENCOUNTER — Ambulatory Visit: Payer: Medicare Other | Admitting: Sports Medicine

## 2019-02-01 DIAGNOSIS — L03115 Cellulitis of right lower limb: Secondary | ICD-10-CM | POA: Diagnosis not present

## 2019-02-01 DIAGNOSIS — R7989 Other specified abnormal findings of blood chemistry: Secondary | ICD-10-CM | POA: Diagnosis not present

## 2019-02-01 DIAGNOSIS — R739 Hyperglycemia, unspecified: Secondary | ICD-10-CM | POA: Diagnosis not present

## 2019-02-01 DIAGNOSIS — M86172 Other acute osteomyelitis, left ankle and foot: Secondary | ICD-10-CM | POA: Diagnosis not present

## 2019-02-03 DIAGNOSIS — S91301D Unspecified open wound, right foot, subsequent encounter: Secondary | ICD-10-CM | POA: Diagnosis not present

## 2019-02-03 DIAGNOSIS — R0602 Shortness of breath: Secondary | ICD-10-CM | POA: Diagnosis not present

## 2019-02-03 DIAGNOSIS — E11649 Type 2 diabetes mellitus with hypoglycemia without coma: Secondary | ICD-10-CM | POA: Diagnosis not present

## 2019-02-03 DIAGNOSIS — Z794 Long term (current) use of insulin: Secondary | ICD-10-CM | POA: Diagnosis not present

## 2019-02-03 DIAGNOSIS — N183 Chronic kidney disease, stage 3 unspecified: Secondary | ICD-10-CM | POA: Diagnosis not present

## 2019-02-03 DIAGNOSIS — Z20828 Contact with and (suspected) exposure to other viral communicable diseases: Secondary | ICD-10-CM | POA: Diagnosis not present

## 2019-02-03 DIAGNOSIS — Z853 Personal history of malignant neoplasm of breast: Secondary | ICD-10-CM | POA: Diagnosis not present

## 2019-02-03 DIAGNOSIS — I482 Chronic atrial fibrillation, unspecified: Secondary | ICD-10-CM | POA: Diagnosis not present

## 2019-02-03 DIAGNOSIS — F0390 Unspecified dementia without behavioral disturbance: Secondary | ICD-10-CM | POA: Diagnosis not present

## 2019-02-03 DIAGNOSIS — L03115 Cellulitis of right lower limb: Secondary | ICD-10-CM | POA: Diagnosis not present

## 2019-02-03 DIAGNOSIS — F418 Other specified anxiety disorders: Secondary | ICD-10-CM | POA: Diagnosis not present

## 2019-02-03 DIAGNOSIS — E1121 Type 2 diabetes mellitus with diabetic nephropathy: Secondary | ICD-10-CM | POA: Diagnosis not present

## 2019-02-03 DIAGNOSIS — S91105D Unspecified open wound of left lesser toe(s) without damage to nail, subsequent encounter: Secondary | ICD-10-CM | POA: Diagnosis not present

## 2019-02-03 DIAGNOSIS — M86172 Other acute osteomyelitis, left ankle and foot: Secondary | ICD-10-CM | POA: Diagnosis not present

## 2019-02-03 DIAGNOSIS — Z932 Ileostomy status: Secondary | ICD-10-CM | POA: Diagnosis not present

## 2019-02-03 DIAGNOSIS — I129 Hypertensive chronic kidney disease with stage 1 through stage 4 chronic kidney disease, or unspecified chronic kidney disease: Secondary | ICD-10-CM | POA: Diagnosis not present

## 2019-02-03 DIAGNOSIS — L03116 Cellulitis of left lower limb: Secondary | ICD-10-CM | POA: Diagnosis not present

## 2019-02-03 DIAGNOSIS — L97519 Non-pressure chronic ulcer of other part of right foot with unspecified severity: Secondary | ICD-10-CM | POA: Diagnosis not present

## 2019-02-03 DIAGNOSIS — E875 Hyperkalemia: Secondary | ICD-10-CM | POA: Diagnosis not present

## 2019-02-03 DIAGNOSIS — Z8673 Personal history of transient ischemic attack (TIA), and cerebral infarction without residual deficits: Secondary | ICD-10-CM | POA: Diagnosis not present

## 2019-02-03 DIAGNOSIS — E1165 Type 2 diabetes mellitus with hyperglycemia: Secondary | ICD-10-CM | POA: Diagnosis not present

## 2019-02-03 DIAGNOSIS — E11621 Type 2 diabetes mellitus with foot ulcer: Secondary | ICD-10-CM | POA: Diagnosis not present

## 2019-02-03 DIAGNOSIS — M86171 Other acute osteomyelitis, right ankle and foot: Secondary | ICD-10-CM | POA: Diagnosis not present

## 2019-02-03 DIAGNOSIS — M1990 Unspecified osteoarthritis, unspecified site: Secondary | ICD-10-CM | POA: Diagnosis not present

## 2019-02-03 DIAGNOSIS — L97426 Non-pressure chronic ulcer of left heel and midfoot with bone involvement without evidence of necrosis: Secondary | ICD-10-CM | POA: Diagnosis not present

## 2019-02-05 DIAGNOSIS — I482 Chronic atrial fibrillation, unspecified: Secondary | ICD-10-CM | POA: Diagnosis not present

## 2019-02-05 DIAGNOSIS — E11621 Type 2 diabetes mellitus with foot ulcer: Secondary | ICD-10-CM | POA: Diagnosis not present

## 2019-02-05 DIAGNOSIS — L97426 Non-pressure chronic ulcer of left heel and midfoot with bone involvement without evidence of necrosis: Secondary | ICD-10-CM | POA: Diagnosis not present

## 2019-02-05 DIAGNOSIS — I129 Hypertensive chronic kidney disease with stage 1 through stage 4 chronic kidney disease, or unspecified chronic kidney disease: Secondary | ICD-10-CM | POA: Diagnosis not present

## 2019-02-05 DIAGNOSIS — N183 Chronic kidney disease, stage 3 unspecified: Secondary | ICD-10-CM | POA: Diagnosis not present

## 2019-02-05 DIAGNOSIS — F0390 Unspecified dementia without behavioral disturbance: Secondary | ICD-10-CM | POA: Diagnosis not present

## 2019-02-06 ENCOUNTER — Telehealth: Payer: Self-pay | Admitting: *Deleted

## 2019-02-06 NOTE — Telephone Encounter (Signed)
Those were antibiotic beads that came out (white round pellets) she has them right sub met 1 and at the left heel -Dr. Chauncey Cruel

## 2019-02-06 NOTE — Telephone Encounter (Signed)
Cindy Hahn states she would like orders to perform wound care 2 x week for 8 weeks and she got a piece of bone from the plantar right foot.

## 2019-02-06 NOTE — Telephone Encounter (Signed)
I spoke with [facility] - Tiffany and okayed the 8 weeks 2 x week wound care and asked the location of the bone removal. Tiffany states the right plantar foot proximal and lateral, hard bone pieces came out when gently probed with a Q-tip, and placed in a bag for pt to show Dr. Cannon Kettle.

## 2019-02-07 DIAGNOSIS — I482 Chronic atrial fibrillation, unspecified: Secondary | ICD-10-CM | POA: Diagnosis not present

## 2019-02-07 DIAGNOSIS — L97426 Non-pressure chronic ulcer of left heel and midfoot with bone involvement without evidence of necrosis: Secondary | ICD-10-CM | POA: Diagnosis not present

## 2019-02-07 DIAGNOSIS — I129 Hypertensive chronic kidney disease with stage 1 through stage 4 chronic kidney disease, or unspecified chronic kidney disease: Secondary | ICD-10-CM | POA: Diagnosis not present

## 2019-02-07 DIAGNOSIS — N183 Chronic kidney disease, stage 3 unspecified: Secondary | ICD-10-CM | POA: Diagnosis not present

## 2019-02-07 DIAGNOSIS — F0390 Unspecified dementia without behavioral disturbance: Secondary | ICD-10-CM | POA: Diagnosis not present

## 2019-02-07 DIAGNOSIS — E11621 Type 2 diabetes mellitus with foot ulcer: Secondary | ICD-10-CM | POA: Diagnosis not present

## 2019-02-07 NOTE — Telephone Encounter (Signed)
They are variable in size, some large and some small -Dr. Cannon Kettle

## 2019-02-08 ENCOUNTER — Ambulatory Visit (INDEPENDENT_AMBULATORY_CARE_PROVIDER_SITE_OTHER): Payer: Self-pay | Admitting: Sports Medicine

## 2019-02-08 ENCOUNTER — Other Ambulatory Visit: Payer: Self-pay | Admitting: Sports Medicine

## 2019-02-08 ENCOUNTER — Ambulatory Visit (INDEPENDENT_AMBULATORY_CARE_PROVIDER_SITE_OTHER): Payer: Medicare Other

## 2019-02-08 ENCOUNTER — Other Ambulatory Visit: Payer: Self-pay

## 2019-02-08 DIAGNOSIS — E875 Hyperkalemia: Secondary | ICD-10-CM | POA: Diagnosis not present

## 2019-02-08 DIAGNOSIS — Z23 Encounter for immunization: Secondary | ICD-10-CM | POA: Diagnosis not present

## 2019-02-08 DIAGNOSIS — L97522 Non-pressure chronic ulcer of other part of left foot with fat layer exposed: Secondary | ICD-10-CM

## 2019-02-08 DIAGNOSIS — Z9889 Other specified postprocedural states: Secondary | ICD-10-CM | POA: Diagnosis not present

## 2019-02-08 DIAGNOSIS — L97512 Non-pressure chronic ulcer of other part of right foot with fat layer exposed: Secondary | ICD-10-CM

## 2019-02-08 DIAGNOSIS — Z6820 Body mass index (BMI) 20.0-20.9, adult: Secondary | ICD-10-CM | POA: Diagnosis not present

## 2019-02-08 DIAGNOSIS — Z760 Encounter for issue of repeat prescription: Secondary | ICD-10-CM | POA: Diagnosis not present

## 2019-02-08 DIAGNOSIS — F0150 Vascular dementia without behavioral disturbance: Secondary | ICD-10-CM

## 2019-02-08 DIAGNOSIS — L02619 Cutaneous abscess of unspecified foot: Secondary | ICD-10-CM

## 2019-02-08 DIAGNOSIS — L03119 Cellulitis of unspecified part of limb: Secondary | ICD-10-CM

## 2019-02-08 DIAGNOSIS — E1142 Type 2 diabetes mellitus with diabetic polyneuropathy: Secondary | ICD-10-CM

## 2019-02-08 MED ORDER — FLUCONAZOLE 150 MG PO TABS: 150.0000 mg | ORAL_TABLET | Freq: Once | ORAL | 0 refills | Status: AC

## 2019-02-08 NOTE — Progress Notes (Signed)
Subjective: Cindy Hahn is a 84 y.o. female patient seen today in office for follow-up evaluation of bilateral foot ulcers. S/p bilateral I&D with removal of bone and placement of antibiotic beads preformed at [facility] on 01-27-19.  Patient reports that her right foot is hurting her today but otherwise she is doing okay.  Patient has home nurse helping with dressing changes applying packing to the area covered with dry dressing and Adaptic to the dorsal incisions.  Patient reports that she can walk better in her current postoperative shoes and has been using her walker.  Patient is assisted by daughter this visit who reports that her personality has returned back to baseline and she is no longer hysterical.patient denies nausea vomiting fever chills or any other constitutional symptoms at this time.  No other issues noted.   Fasting blood sugar 168.  Patient will see PCP Dr. Helene Kelp on today.  Patient Active Problem List   Diagnosis Date Noted  . Osteomyelitis ([place]) 10/17/2018  . Diabetic foot ulcer ([place]) 05/05/2018  . History of CVA (cerebrovascular accident) 05/05/2018  . Dementia ([place]) 05/05/2018  . Diabetic polyneuropathy associated with type 2 diabetes mellitus ([place]) 06/04/2015  . Pacemaker 03/11/2015  . Bradycardia 02/06/2015  . Chronic atrial fibrillation ([place]) 11/01/2014  . Essential hypertension 11/01/2014  . Diabetes mellitus ([place]) 11/01/2014    Current Outpatient Medications on File Prior to Visit  Medication Sig Dispense Refill  . ALPRAZolam (XANAX) 1 MG tablet Take 1 mg by mouth.    Marland Kitchen amoxicillin-clavulanate (AUGMENTIN) 500-125 MG tablet Take 1 tablet (500 mg total) by mouth 2 (two) times daily. 28 tablet 0  . aspirin EC 81 MG tablet Take 81 mg by mouth.    . BD INSULIN SYRINGE U/F 31G X 5/16" 1 ML MISC     . ciprofloxacin (CIPRO) 500 MG tablet Take 1 tablet (500 mg total) by mouth 2 (two) times daily. 20 tablet 0  . digoxin (LANOXIN) 0.125 MG tablet Take 0.0625 mg  by mouth daily.   0  . diltiazem (CARDIZEM) 90 MG tablet Take 1 tablet (90 mg total) by mouth 2 (two) times daily. 60 tablet 11  . LEVEMIR 100 UNIT/ML injection Inject 74 Units into the skin.     . metoprolol tartrate (LOPRESSOR) 50 MG tablet Take 75 mg by mouth 2 (two) times daily.   0  . NOVOLOG FLEXPEN 100 UNIT/ML FlexPen inject 10 units SUBCUTANEOUSLY WITH MAIN MEAL  0  . potassium chloride SA (K-DUR,KLOR-CON) 20 MEQ tablet Take 20 mEq by mouth daily.   0  . rosuvastatin (CRESTOR) 20 MG tablet   0  . sertraline (ZOLOFT) 100 MG tablet     . silver nitrate applicators 01-09 % applicator Apply to wound every other day then cover with guaze and papertape 100 each 0   Current Facility-Administered Medications on File Prior to Visit  Medication Dose Route Frequency Provider Last Rate Last Dose  . triamcinolone acetonide (KENALOG) 10 MG/ML injection 10 mg  10 mg Other Once Landis Martins, DPM        Allergies  Allergen Reactions  . Levofloxacin Other (See Comments)    confusion    Objective: There were no vitals filed for this visit.  General: No acute distress, AAOx3  To the left sub met 1 there is healed pre-ulcerative callus.  To the right foot there is a full-thickness ulcer present sub-met 1 that measures 1 x1 x0.5 cm with a granular base with mild swelling erythema and  edema dorsal sutures intact.  To the left heel that measures 1.5cm width x1.5 cm in length x 0.5cm probes to soft tissue in range area feels like it is filling in with a granular tissue and no exposed antibiotic beads the left heel wound bed is granular with mild periwound maceration, mild soft tissue swelling, no warmth no redness no surrounding signs of infection.  No pus from the open area at the left heel or plantar sub-met 1 on right.  There is + pain to left heel and sub-met 1 on right.Neurovascular unchanged from prior.  Xray-consistent with postoperative changes with antibiotic beads in place  Assessment and  Plan:  Problem List Items Addressed This Visit      Endocrine   Diabetic polyneuropathy associated with type 2 diabetes mellitus ([place])     Nervous and Auditory   Dementia ([place])    Other Visit Diagnoses    Foot ulcer, right, with fat layer exposed ([place])    -  Primary   Relevant Orders   DG Foot Complete Right   Cellulitis and abscess of foot, except toes       Foot ulcer with fat layer exposed, left ([place])          -Patient seen and evaluated -x-rays reviewed  -Applied dry sterile dressing and plain packing to surgical site bilateral -Nursing to continue with the same twice weekly -Continue with postoperative shoes and walker to assist with ambulation -Continue with ciprofloxacin estimated for 4 weeks -Prescribe Diflucan for patient to take to prevent attacks yeast infection while on antibiotic -Encouraged to continue with rest and elevation to assist with wound healing -Return to office after in 1 week for possible suture removal or sooner if problems or issues arise.    Landis Martins, DPM

## 2019-02-09 ENCOUNTER — Other Ambulatory Visit: Payer: Self-pay | Admitting: Sports Medicine

## 2019-02-09 DIAGNOSIS — L97512 Non-pressure chronic ulcer of other part of right foot with fat layer exposed: Secondary | ICD-10-CM

## 2019-02-09 DIAGNOSIS — Z9889 Other specified postprocedural states: Secondary | ICD-10-CM

## 2019-02-09 DIAGNOSIS — L97522 Non-pressure chronic ulcer of other part of left foot with fat layer exposed: Secondary | ICD-10-CM

## 2019-02-10 ENCOUNTER — Ambulatory Visit: Payer: Medicare Other | Admitting: Sports Medicine

## 2019-02-10 DIAGNOSIS — E11621 Type 2 diabetes mellitus with foot ulcer: Secondary | ICD-10-CM | POA: Diagnosis not present

## 2019-02-10 DIAGNOSIS — L97426 Non-pressure chronic ulcer of left heel and midfoot with bone involvement without evidence of necrosis: Secondary | ICD-10-CM | POA: Diagnosis not present

## 2019-02-10 DIAGNOSIS — N183 Chronic kidney disease, stage 3 unspecified: Secondary | ICD-10-CM | POA: Diagnosis not present

## 2019-02-10 DIAGNOSIS — I129 Hypertensive chronic kidney disease with stage 1 through stage 4 chronic kidney disease, or unspecified chronic kidney disease: Secondary | ICD-10-CM | POA: Diagnosis not present

## 2019-02-10 DIAGNOSIS — I482 Chronic atrial fibrillation, unspecified: Secondary | ICD-10-CM | POA: Diagnosis not present

## 2019-02-10 DIAGNOSIS — F0390 Unspecified dementia without behavioral disturbance: Secondary | ICD-10-CM | POA: Diagnosis not present

## 2019-02-13 DIAGNOSIS — I129 Hypertensive chronic kidney disease with stage 1 through stage 4 chronic kidney disease, or unspecified chronic kidney disease: Secondary | ICD-10-CM | POA: Diagnosis not present

## 2019-02-13 DIAGNOSIS — F0390 Unspecified dementia without behavioral disturbance: Secondary | ICD-10-CM | POA: Diagnosis not present

## 2019-02-13 DIAGNOSIS — E11621 Type 2 diabetes mellitus with foot ulcer: Secondary | ICD-10-CM | POA: Diagnosis not present

## 2019-02-13 DIAGNOSIS — N183 Chronic kidney disease, stage 3 unspecified: Secondary | ICD-10-CM | POA: Diagnosis not present

## 2019-02-13 DIAGNOSIS — I482 Chronic atrial fibrillation, unspecified: Secondary | ICD-10-CM | POA: Diagnosis not present

## 2019-02-13 DIAGNOSIS — L97426 Non-pressure chronic ulcer of left heel and midfoot with bone involvement without evidence of necrosis: Secondary | ICD-10-CM | POA: Diagnosis not present

## 2019-02-14 ENCOUNTER — Telehealth: Payer: Self-pay | Admitting: Urology

## 2019-02-14 DIAGNOSIS — N183 Chronic kidney disease, stage 3 unspecified: Secondary | ICD-10-CM | POA: Diagnosis not present

## 2019-02-14 DIAGNOSIS — F0390 Unspecified dementia without behavioral disturbance: Secondary | ICD-10-CM | POA: Diagnosis not present

## 2019-02-14 DIAGNOSIS — I129 Hypertensive chronic kidney disease with stage 1 through stage 4 chronic kidney disease, or unspecified chronic kidney disease: Secondary | ICD-10-CM | POA: Diagnosis not present

## 2019-02-14 DIAGNOSIS — L97426 Non-pressure chronic ulcer of left heel and midfoot with bone involvement without evidence of necrosis: Secondary | ICD-10-CM | POA: Diagnosis not present

## 2019-02-14 DIAGNOSIS — E11621 Type 2 diabetes mellitus with foot ulcer: Secondary | ICD-10-CM | POA: Diagnosis not present

## 2019-02-14 DIAGNOSIS — I482 Chronic atrial fibrillation, unspecified: Secondary | ICD-10-CM | POA: Diagnosis not present

## 2019-02-14 NOTE — Telephone Encounter (Signed)
Ok thanks for letting me know -Dr. Cannon Kettle

## 2019-02-14 NOTE — Telephone Encounter (Signed)
PT FELL 02/13/2019. NO INJURIES.

## 2019-02-15 ENCOUNTER — Ambulatory Visit (INDEPENDENT_AMBULATORY_CARE_PROVIDER_SITE_OTHER): Payer: Medicare Other | Admitting: Sports Medicine

## 2019-02-15 ENCOUNTER — Telehealth: Payer: Self-pay | Admitting: *Deleted

## 2019-02-15 ENCOUNTER — Other Ambulatory Visit: Payer: Self-pay

## 2019-02-15 DIAGNOSIS — F0390 Unspecified dementia without behavioral disturbance: Secondary | ICD-10-CM | POA: Diagnosis not present

## 2019-02-15 DIAGNOSIS — L02619 Cutaneous abscess of unspecified foot: Secondary | ICD-10-CM | POA: Diagnosis not present

## 2019-02-15 DIAGNOSIS — L97522 Non-pressure chronic ulcer of other part of left foot with fat layer exposed: Secondary | ICD-10-CM

## 2019-02-15 DIAGNOSIS — N183 Chronic kidney disease, stage 3 unspecified: Secondary | ICD-10-CM | POA: Diagnosis not present

## 2019-02-15 DIAGNOSIS — I129 Hypertensive chronic kidney disease with stage 1 through stage 4 chronic kidney disease, or unspecified chronic kidney disease: Secondary | ICD-10-CM | POA: Diagnosis not present

## 2019-02-15 DIAGNOSIS — L97512 Non-pressure chronic ulcer of other part of right foot with fat layer exposed: Secondary | ICD-10-CM

## 2019-02-15 DIAGNOSIS — E1142 Type 2 diabetes mellitus with diabetic polyneuropathy: Secondary | ICD-10-CM | POA: Diagnosis not present

## 2019-02-15 DIAGNOSIS — L97426 Non-pressure chronic ulcer of left heel and midfoot with bone involvement without evidence of necrosis: Secondary | ICD-10-CM | POA: Diagnosis not present

## 2019-02-15 DIAGNOSIS — E11621 Type 2 diabetes mellitus with foot ulcer: Secondary | ICD-10-CM | POA: Diagnosis not present

## 2019-02-15 DIAGNOSIS — F0150 Vascular dementia without behavioral disturbance: Secondary | ICD-10-CM | POA: Diagnosis not present

## 2019-02-15 DIAGNOSIS — L03119 Cellulitis of unspecified part of limb: Secondary | ICD-10-CM

## 2019-02-15 DIAGNOSIS — I482 Chronic atrial fibrillation, unspecified: Secondary | ICD-10-CM | POA: Diagnosis not present

## 2019-02-15 NOTE — Progress Notes (Signed)
Subjective: Cindy Hahn is a 84 y.o. female patient seen today in office for follow-up evaluation of bilateral foot ulcers. S/p bilateral I&D with removal of bone and placement of antibiotic beads preformed at [facility] on 01-27-19.  Patient that there is some pain on the top of her right foot with still some redness and swelling.  Patient has been taking her Cipro antibiotics has approximately 2-1/2 to 3 weeks left and has been wearing surgical shoes but does have a new area of rubbing.  No other issues noted.   Fasting blood sugar 236  Patient will see PCP Dr. Helene Kelp on today.  Patient Active Problem List   Diagnosis Date Noted  . Osteomyelitis ([place]) 10/17/2018  . Diabetic foot ulcer ([place]) 05/05/2018  . History of CVA (cerebrovascular accident) 05/05/2018  . Dementia ([place]) 05/05/2018  . Diabetic polyneuropathy associated with type 2 diabetes mellitus ([place]) 06/04/2015  . Pacemaker 03/11/2015  . Bradycardia 02/06/2015  . Chronic atrial fibrillation ([place]) 11/01/2014  . Essential hypertension 11/01/2014  . Diabetes mellitus ([place]) 11/01/2014    Current Outpatient Medications on File Prior to Visit  Medication Sig Dispense Refill  . ALPRAZolam (XANAX) 1 MG tablet Take 1 mg by mouth.    Marland Kitchen amoxicillin-clavulanate (AUGMENTIN) 500-125 MG tablet Take 1 tablet (500 mg total) by mouth 2 (two) times daily. 28 tablet 0  . aspirin EC 81 MG tablet Take 81 mg by mouth.    . BD INSULIN SYRINGE U/F 31G X 5/16" 1 ML MISC     . ciprofloxacin (CIPRO) 500 MG tablet Take 1 tablet (500 mg total) by mouth 2 (two) times daily. 20 tablet 0  . digoxin (LANOXIN) 0.125 MG tablet Take 0.0625 mg by mouth daily.   0  . diltiazem (CARDIZEM) 90 MG tablet Take 1 tablet (90 mg total) by mouth 2 (two) times daily. 60 tablet 11  . LEVEMIR 100 UNIT/ML injection Inject 74 Units into the skin.     . metoprolol tartrate (LOPRESSOR) 50 MG tablet Take 75 mg by mouth 2 (two) times daily.   0  . NOVOLOG FLEXPEN 100  UNIT/ML FlexPen inject 10 units SUBCUTANEOUSLY WITH MAIN MEAL  0  . potassium chloride SA (K-DUR,KLOR-CON) 20 MEQ tablet Take 20 mEq by mouth daily.   0  . rosuvastatin (CRESTOR) 20 MG tablet   0  . sertraline (ZOLOFT) 100 MG tablet     . silver nitrate applicators 27-03 % applicator Apply to wound every other day then cover with guaze and papertape 100 each 0   Current Facility-Administered Medications on File Prior to Visit  Medication Dose Route Frequency Provider Last Rate Last Dose  . triamcinolone acetonide (KENALOG) 10 MG/ML injection 10 mg  10 mg Other Once Landis Martins, DPM        Allergies  Allergen Reactions  . Levofloxacin Other (See Comments)    confusion    Objective: There were no vitals filed for this visit.  General: No acute distress, AAOx3  To the left sub met 1 there is healed pre-ulcerative callus.  To the right foot there is a full-thickness ulcer present sub-met 1 that measures 1 x1 x1 cm with a granular base with mild swelling erythema and edema dorsal sutures intact.  To the left heel that measures 1.5cm width x2 cm in length x 2cm probes to soft tissue in range area feels like it is filling in with a granular tissue and no exposed antibiotic beads the left heel wound bed is granular  with mild periwound maceration, mild soft tissue swelling, no warmth no redness no surrounding signs of infection.  No pus from the open area at the left heel or plantar sub-met 1 on right.  There is + pain to left heel and sub-met 1 on right with swelling of the forefoot.Neurovascular unchanged from prior.   Assessment and Plan:  Problem List Items Addressed This Visit      Endocrine   Diabetic polyneuropathy associated with type 2 diabetes mellitus ([place])     Nervous and Auditory   Dementia ([place])    Other Visit Diagnoses    Cellulitis and abscess of foot, except toes    -  Primary   Foot ulcer, right, with fat layer exposed ([place])       Foot ulcer with fat layer exposed,  left ([place])          -Patient seen and evaluated -A few sutures removed -Applied Unna boot to right with dry sterile dressing and plain packing to surgical sites open ulcers bilateral -Nursing to continue with the same twice weekly with instructions to recheck the Unna boot on Friday -Continue with postoperative shoes and walker to assist with ambulation -Continue with ciprofloxacin estimated for 3 weeks -Encouraged to continue with rest and elevation to assist with wound healing -Return to office after in 1 week for finishing suture removal or sooner if problems or issues arise.    Landis Martins, DPM

## 2019-02-15 NOTE — Telephone Encounter (Signed)
-----   Message from Landis Martins, [place] sent at 02/15/2019 12:05 PM EDT ----- Regarding: Home nursing orders Unna boot was placed today in office on the right foot.  Please see patient again on Friday to check the [employer].  If there is significant drainage replace Unna boot and apply packing to the plantar wound on the right foot.   Continue with packing and dry dressing on the left foot.

## 2019-02-15 NOTE — Telephone Encounter (Signed)
Faxed copy of DR. Stover's 02/15/2019 12:05pm orders to [facility].

## 2019-02-17 ENCOUNTER — Telehealth: Payer: Self-pay | Admitting: Urology

## 2019-02-17 DIAGNOSIS — L97426 Non-pressure chronic ulcer of left heel and midfoot with bone involvement without evidence of necrosis: Secondary | ICD-10-CM | POA: Diagnosis not present

## 2019-02-17 DIAGNOSIS — N183 Chronic kidney disease, stage 3 unspecified: Secondary | ICD-10-CM | POA: Diagnosis not present

## 2019-02-17 DIAGNOSIS — F0390 Unspecified dementia without behavioral disturbance: Secondary | ICD-10-CM | POA: Diagnosis not present

## 2019-02-17 DIAGNOSIS — I482 Chronic atrial fibrillation, unspecified: Secondary | ICD-10-CM | POA: Diagnosis not present

## 2019-02-17 DIAGNOSIS — I129 Hypertensive chronic kidney disease with stage 1 through stage 4 chronic kidney disease, or unspecified chronic kidney disease: Secondary | ICD-10-CM | POA: Diagnosis not present

## 2019-02-17 DIAGNOSIS — E11621 Type 2 diabetes mellitus with foot ulcer: Secondary | ICD-10-CM | POA: Diagnosis not present

## 2019-02-17 NOTE — Telephone Encounter (Signed)
Home Health Nurse saw her today and wanted to report Pt's HR is 104. THanks

## 2019-02-20 ENCOUNTER — Telehealth: Payer: Self-pay | Admitting: Urology

## 2019-02-20 DIAGNOSIS — E11621 Type 2 diabetes mellitus with foot ulcer: Secondary | ICD-10-CM | POA: Diagnosis not present

## 2019-02-20 DIAGNOSIS — N183 Chronic kidney disease, stage 3 unspecified: Secondary | ICD-10-CM | POA: Diagnosis not present

## 2019-02-20 DIAGNOSIS — L97426 Non-pressure chronic ulcer of left heel and midfoot with bone involvement without evidence of necrosis: Secondary | ICD-10-CM | POA: Diagnosis not present

## 2019-02-20 DIAGNOSIS — I482 Chronic atrial fibrillation, unspecified: Secondary | ICD-10-CM | POA: Diagnosis not present

## 2019-02-20 DIAGNOSIS — I129 Hypertensive chronic kidney disease with stage 1 through stage 4 chronic kidney disease, or unspecified chronic kidney disease: Secondary | ICD-10-CM | POA: Diagnosis not present

## 2019-02-20 DIAGNOSIS — F0390 Unspecified dementia without behavioral disturbance: Secondary | ICD-10-CM | POA: Diagnosis not present

## 2019-02-20 NOTE — Telephone Encounter (Signed)
Cindy Hahn from [facility] called and wanted to let you know that her HR was 104-110 today. She didn't know if this is baseline for her and if so  they can change the parameters  So they arent calling us weekly about it.   Also pt fell on 02/19/2019. No injuries.  Also pt is complaining that her una boot on her right foot is too tight and hurting. Wanted to make you aware that way if she brings it up on 11/4.

## 2019-02-20 NOTE — Telephone Encounter (Signed)
[place] nurse can change the parameters and have her follow up with PCP. Also home nurse can loosen or adjust the unna boot if she continues to complain. I will see her in office this week.

## 2019-02-22 ENCOUNTER — Encounter: Payer: Self-pay | Admitting: Sports Medicine

## 2019-02-22 ENCOUNTER — Telehealth: Payer: Self-pay | Admitting: *Deleted

## 2019-02-22 ENCOUNTER — Ambulatory Visit (INDEPENDENT_AMBULATORY_CARE_PROVIDER_SITE_OTHER): Payer: Self-pay | Admitting: Sports Medicine

## 2019-02-22 ENCOUNTER — Other Ambulatory Visit: Payer: Self-pay

## 2019-02-22 DIAGNOSIS — L97522 Non-pressure chronic ulcer of other part of left foot with fat layer exposed: Secondary | ICD-10-CM

## 2019-02-22 DIAGNOSIS — L03119 Cellulitis of unspecified part of limb: Secondary | ICD-10-CM

## 2019-02-22 DIAGNOSIS — I482 Chronic atrial fibrillation, unspecified: Secondary | ICD-10-CM | POA: Diagnosis not present

## 2019-02-22 DIAGNOSIS — F0390 Unspecified dementia without behavioral disturbance: Secondary | ICD-10-CM | POA: Diagnosis not present

## 2019-02-22 DIAGNOSIS — L97426 Non-pressure chronic ulcer of left heel and midfoot with bone involvement without evidence of necrosis: Secondary | ICD-10-CM | POA: Diagnosis not present

## 2019-02-22 DIAGNOSIS — L02619 Cutaneous abscess of unspecified foot: Secondary | ICD-10-CM

## 2019-02-22 DIAGNOSIS — N183 Chronic kidney disease, stage 3 unspecified: Secondary | ICD-10-CM | POA: Diagnosis not present

## 2019-02-22 DIAGNOSIS — E1142 Type 2 diabetes mellitus with diabetic polyneuropathy: Secondary | ICD-10-CM

## 2019-02-22 DIAGNOSIS — I129 Hypertensive chronic kidney disease with stage 1 through stage 4 chronic kidney disease, or unspecified chronic kidney disease: Secondary | ICD-10-CM | POA: Diagnosis not present

## 2019-02-22 DIAGNOSIS — F0150 Vascular dementia without behavioral disturbance: Secondary | ICD-10-CM

## 2019-02-22 DIAGNOSIS — E11621 Type 2 diabetes mellitus with foot ulcer: Secondary | ICD-10-CM | POA: Diagnosis not present

## 2019-02-22 DIAGNOSIS — L97512 Non-pressure chronic ulcer of other part of right foot with fat layer exposed: Secondary | ICD-10-CM

## 2019-02-22 NOTE — Telephone Encounter (Signed)
-----   Message from Landis Martins, [place] sent at 02/22/2019 12:25 PM EST ----- Regarding: Home nursing orders Wound care Packing and dry dressing to bilateral foot ulcers twice weekly

## 2019-02-22 NOTE — Telephone Encounter (Signed)
Faxed Dr. Leeanne Rio 02/22/2019 12:25pm orders to [facility].

## 2019-02-22 NOTE — Progress Notes (Signed)
Subjective: ENES WEGENER is a 84 y.o. female patient seen today in office for follow-up evaluation of bilateral foot ulcers. S/p bilateral I&D with removal of bone and placement of antibiotic beads preformed at [facility] on 01-27-19.  Patient that there is some pain on the right foot at the big toe joint.  Patient has been taking her Cipro antibiotics has approximately 1-1/2-2 weeks left and has been wearing surgical shoes but does have trouble walking.  No other issues noted.   Fasting blood sugar 168  PCP visit last week  Patient Active Problem List   Diagnosis Date Noted  . Osteomyelitis ([place]) 10/17/2018  . Diabetic foot ulcer ([place]) 05/05/2018  . History of CVA (cerebrovascular accident) 05/05/2018  . Dementia ([place]) 05/05/2018  . Diabetic polyneuropathy associated with type 2 diabetes mellitus ([place]) 06/04/2015  . Pacemaker 03/11/2015  . Bradycardia 02/06/2015  . Chronic atrial fibrillation ([place]) 11/01/2014  . Essential hypertension 11/01/2014  . Diabetes mellitus ([place]) 11/01/2014    Current Outpatient Medications on File Prior to Visit  Medication Sig Dispense Refill  . ALPRAZolam (XANAX) 1 MG tablet Take 1 mg by mouth.    Marland Kitchen amoxicillin-clavulanate (AUGMENTIN) 500-125 MG tablet Take 1 tablet (500 mg total) by mouth 2 (two) times daily. 28 tablet 0  . aspirin EC 81 MG tablet Take 81 mg by mouth.    . BD INSULIN SYRINGE U/F 31G X 5/16" 1 ML MISC     . ciprofloxacin (CIPRO) 500 MG tablet Take 1 tablet (500 mg total) by mouth 2 (two) times daily. 20 tablet 0  . digoxin (LANOXIN) 0.125 MG tablet Take 0.0625 mg by mouth daily.   0  . diltiazem (CARDIZEM) 90 MG tablet Take 1 tablet (90 mg total) by mouth 2 (two) times daily. 60 tablet 11  . LEVEMIR 100 UNIT/ML injection Inject 74 Units into the skin.     . metoprolol tartrate (LOPRESSOR) 50 MG tablet Take 75 mg by mouth 2 (two) times daily.   0  . NOVOLOG FLEXPEN 100 UNIT/ML FlexPen inject 10 units SUBCUTANEOUSLY WITH MAIN  MEAL  0  . potassium chloride SA (K-DUR,KLOR-CON) 20 MEQ tablet Take 20 mEq by mouth daily.   0  . rosuvastatin (CRESTOR) 20 MG tablet   0  . sertraline (ZOLOFT) 100 MG tablet     . silver nitrate applicators 70-26 % applicator Apply to wound every other day then cover with guaze and papertape 100 each 0   Current Facility-Administered Medications on File Prior to Visit  Medication Dose Route Frequency Provider Last Rate Last Dose  . triamcinolone acetonide (KENALOG) 10 MG/ML injection 10 mg  10 mg Other Once Landis Martins, DPM        Allergies  Allergen Reactions  . Levofloxacin Other (See Comments)    confusion    Objective: There were no vitals filed for this visit.  General: No acute distress, AAOx3  To the left sub met 1 there is healed pre-ulcerative callus.  To the right foot there is a full-thickness ulcer present sub-met 1 that measures 1 x1 x0.8 cm with a granular base with mild swelling erythema and edema dorsal sutures intact.  To the left heel that measures 1.5cm width x1.5 cm in length x 1.8cm probes to soft tissue in range area feels like it is filling in with a granular tissue and no exposed antibiotic beads the left heel wound bed is granular with mild periwound maceration, mild soft tissue swelling, no warmth no redness no  surrounding signs of infection.  No pus from the open area at the left heel or plantar sub-met 1 on right.  There is + pain to left heel and sub-met 1 on right with swelling of the forefoot.Neurovascular unchanged from prior.   Assessment and Plan:  Problem List Items Addressed This Visit      Endocrine   Diabetic polyneuropathy associated with type 2 diabetes mellitus ([place])     Nervous and Auditory   Dementia ([place])    Other Visit Diagnoses    Foot ulcer, right, with fat layer exposed ([place])    -  Primary   Foot ulcer with fat layer exposed, left ([place])       Cellulitis and abscess of foot, except toes          -Patient seen and  evaluated -Remaining sutures removed -Applied packing and dry dressing to surgical sites bilateral. -Nursing to continue with the same twice weekly -Continue with postoperative shoes and walker to assist with ambulation -Continue with ciprofloxacin estimated for 1/2 to 2 weeks -Encouraged to continue with rest and elevation to assist with wound healing and advised Aleve if tolerated for pain and inflammation on right -Return to office after in 1 week for wound care and x-rays or sooner if problems or issues arise.    Landis Martins, DPM

## 2019-02-24 DIAGNOSIS — N183 Chronic kidney disease, stage 3 unspecified: Secondary | ICD-10-CM | POA: Diagnosis not present

## 2019-02-24 DIAGNOSIS — I129 Hypertensive chronic kidney disease with stage 1 through stage 4 chronic kidney disease, or unspecified chronic kidney disease: Secondary | ICD-10-CM | POA: Diagnosis not present

## 2019-02-24 DIAGNOSIS — L97426 Non-pressure chronic ulcer of left heel and midfoot with bone involvement without evidence of necrosis: Secondary | ICD-10-CM | POA: Diagnosis not present

## 2019-02-24 DIAGNOSIS — I482 Chronic atrial fibrillation, unspecified: Secondary | ICD-10-CM | POA: Diagnosis not present

## 2019-02-24 DIAGNOSIS — E11621 Type 2 diabetes mellitus with foot ulcer: Secondary | ICD-10-CM | POA: Diagnosis not present

## 2019-02-24 DIAGNOSIS — F0390 Unspecified dementia without behavioral disturbance: Secondary | ICD-10-CM | POA: Diagnosis not present

## 2019-02-27 DIAGNOSIS — I482 Chronic atrial fibrillation, unspecified: Secondary | ICD-10-CM | POA: Diagnosis not present

## 2019-02-27 DIAGNOSIS — I129 Hypertensive chronic kidney disease with stage 1 through stage 4 chronic kidney disease, or unspecified chronic kidney disease: Secondary | ICD-10-CM | POA: Diagnosis not present

## 2019-02-27 DIAGNOSIS — F0390 Unspecified dementia without behavioral disturbance: Secondary | ICD-10-CM | POA: Diagnosis not present

## 2019-02-27 DIAGNOSIS — N183 Chronic kidney disease, stage 3 unspecified: Secondary | ICD-10-CM | POA: Diagnosis not present

## 2019-02-27 DIAGNOSIS — L97426 Non-pressure chronic ulcer of left heel and midfoot with bone involvement without evidence of necrosis: Secondary | ICD-10-CM | POA: Diagnosis not present

## 2019-02-27 DIAGNOSIS — E11621 Type 2 diabetes mellitus with foot ulcer: Secondary | ICD-10-CM | POA: Diagnosis not present

## 2019-02-28 DIAGNOSIS — E11621 Type 2 diabetes mellitus with foot ulcer: Secondary | ICD-10-CM | POA: Diagnosis not present

## 2019-02-28 DIAGNOSIS — F0390 Unspecified dementia without behavioral disturbance: Secondary | ICD-10-CM | POA: Diagnosis not present

## 2019-02-28 DIAGNOSIS — L97426 Non-pressure chronic ulcer of left heel and midfoot with bone involvement without evidence of necrosis: Secondary | ICD-10-CM | POA: Diagnosis not present

## 2019-02-28 DIAGNOSIS — I129 Hypertensive chronic kidney disease with stage 1 through stage 4 chronic kidney disease, or unspecified chronic kidney disease: Secondary | ICD-10-CM | POA: Diagnosis not present

## 2019-02-28 DIAGNOSIS — N183 Chronic kidney disease, stage 3 unspecified: Secondary | ICD-10-CM | POA: Diagnosis not present

## 2019-02-28 DIAGNOSIS — I482 Chronic atrial fibrillation, unspecified: Secondary | ICD-10-CM | POA: Diagnosis not present

## 2019-03-01 ENCOUNTER — Other Ambulatory Visit: Payer: Self-pay

## 2019-03-01 ENCOUNTER — Ambulatory Visit (INDEPENDENT_AMBULATORY_CARE_PROVIDER_SITE_OTHER): Payer: Medicare Other | Admitting: Sports Medicine

## 2019-03-01 ENCOUNTER — Ambulatory Visit (INDEPENDENT_AMBULATORY_CARE_PROVIDER_SITE_OTHER): Payer: Medicare Other

## 2019-03-01 ENCOUNTER — Other Ambulatory Visit: Payer: Self-pay | Admitting: Sports Medicine

## 2019-03-01 ENCOUNTER — Telehealth: Payer: Self-pay

## 2019-03-01 ENCOUNTER — Encounter: Payer: Self-pay | Admitting: Sports Medicine

## 2019-03-01 DIAGNOSIS — L03119 Cellulitis of unspecified part of limb: Secondary | ICD-10-CM | POA: Diagnosis not present

## 2019-03-01 DIAGNOSIS — E1142 Type 2 diabetes mellitus with diabetic polyneuropathy: Secondary | ICD-10-CM | POA: Diagnosis not present

## 2019-03-01 DIAGNOSIS — L02619 Cutaneous abscess of unspecified foot: Secondary | ICD-10-CM

## 2019-03-01 DIAGNOSIS — F0150 Vascular dementia without behavioral disturbance: Secondary | ICD-10-CM

## 2019-03-01 DIAGNOSIS — L97522 Non-pressure chronic ulcer of other part of left foot with fat layer exposed: Secondary | ICD-10-CM | POA: Diagnosis not present

## 2019-03-01 DIAGNOSIS — L97512 Non-pressure chronic ulcer of other part of right foot with fat layer exposed: Secondary | ICD-10-CM

## 2019-03-01 MED ORDER — SULFAMETHOXAZOLE-TRIMETHOPRIM 400-80 MG PO TABS: 1.0000 | ORAL_TABLET | Freq: Two times a day (BID) | ORAL | 0 refills | Status: DC

## 2019-03-01 MED ORDER — FLUCONAZOLE 150 MG PO TABS: 150.0000 mg | ORAL_TABLET | Freq: Once | ORAL | 0 refills | Status: AC

## 2019-03-01 NOTE — Telephone Encounter (Signed)
Spoke with the Pt about the Dr's instructions for foot pain. Pt stated understanding

## 2019-03-01 NOTE — Progress Notes (Signed)
Subjective: Cindy Hahn is a 84 y.o. female patient seen today in office for follow-up evaluation of bilateral foot ulcers. S/p bilateral I&D with removal of bone and placement of antibiotic beads preformed at [facility] on 01-27-19.  Patient that there is some pain on the right foot at the big toe joint.  Patient has been taking her Cipro antibiotics has approximately 1 week left and has been wearing surgical shoes but does have trouble walking.  Reports has fallen again.  No other issues noted.   Fasting blood sugar 134  PCP visit 2 weeks ago.  Patient Active Problem List   Diagnosis Date Noted  . Osteomyelitis ([place]) 10/17/2018  . Diabetic foot ulcer ([place]) 05/05/2018  . History of CVA (cerebrovascular accident) 05/05/2018  . Dementia ([place]) 05/05/2018  . Diabetic polyneuropathy associated with type 2 diabetes mellitus ([place]) 06/04/2015  . Pacemaker 03/11/2015  . Bradycardia 02/06/2015  . Chronic atrial fibrillation ([place]) 11/01/2014  . Essential hypertension 11/01/2014  . Diabetes mellitus ([place]) 11/01/2014    Current Outpatient Medications on File Prior to Visit  Medication Sig Dispense Refill  . ALPRAZolam (XANAX) 1 MG tablet Take 1 mg by mouth.    Marland Kitchen amoxicillin-clavulanate (AUGMENTIN) 500-125 MG tablet Take 1 tablet (500 mg total) by mouth 2 (two) times daily. 28 tablet 0  . aspirin EC 81 MG tablet Take 81 mg by mouth.    . BD INSULIN SYRINGE U/F 31G X 5/16" 1 ML MISC     . ciprofloxacin (CIPRO) 500 MG tablet Take 1 tablet (500 mg total) by mouth 2 (two) times daily. 20 tablet 0  . digoxin (LANOXIN) 0.125 MG tablet Take 0.0625 mg by mouth daily.   0  . diltiazem (CARDIZEM) 90 MG tablet Take 1 tablet (90 mg total) by mouth 2 (two) times daily. 60 tablet 11  . LEVEMIR 100 UNIT/ML injection Inject 74 Units into the skin.     . metoprolol tartrate (LOPRESSOR) 50 MG tablet Take 75 mg by mouth 2 (two) times daily.   0  . NOVOLOG FLEXPEN 100 UNIT/ML FlexPen inject 10 units  SUBCUTANEOUSLY WITH MAIN MEAL  0  . potassium chloride SA (K-DUR,KLOR-CON) 20 MEQ tablet Take 20 mEq by mouth daily.   0  . rosuvastatin (CRESTOR) 20 MG tablet   0  . sertraline (ZOLOFT) 100 MG tablet     . silver nitrate applicators 40-98 % applicator Apply to wound every other day then cover with guaze and papertape 100 each 0   Current Facility-Administered Medications on File Prior to Visit  Medication Dose Route Frequency Provider Last Rate Last Dose  . triamcinolone acetonide (KENALOG) 10 MG/ML injection 10 mg  10 mg Other Once Landis Martins, DPM        Allergies  Allergen Reactions  . Levofloxacin Other (See Comments)    confusion    Objective: There were no vitals filed for this visit.  General: No acute distress, AAOx3  To the left sub met 1 there is healed pre-ulcerative callus.  To the right foot there is a full-thickness ulcer present sub-met 1 that measures 1 x1 x0.4 cm with a granular base with mild swelling erythema and edema dorsal sutures intact.  To the left heel that measures 1.5cm width x1.5 cm in length x 0.4cm probes to soft tissue in range area feels like it is filling in with a granular tissue and no exposed antibiotic beads the left heel wound bed is granular with mild periwound maceration, to the right  foot there is a fluctuant blister noted at the fifth metatarsophalangeal joint, mild soft tissue swelling, no warmth no redness no surrounding signs of infection.  No pus from the open area at the left heel or plantar sub-met 1 on right.  There is + pain to left heel and sub-met 1 on right with swelling of the forefoot.Neurovascular unchanged from prior.   Assessment and Plan:  Problem List Items Addressed This Visit      Endocrine   Diabetic polyneuropathy associated with type 2 diabetes mellitus ([place])     Nervous and Auditory   Dementia ([place])    Other Visit Diagnoses    Foot ulcer, right, with fat layer exposed ([place])    -  Primary   Relevant Orders    WOUND CULTURE   Foot ulcer with fat layer exposed, left ([place])       Cellulitis and abscess of foot, except toes          -Patient seen and evaluated -X-rays reviewed -Lanced blister at right fifth metatarsophalangeal joint wound culture obtained applied Betadine followed by packing and dry dressing to surgical sites bilateral. -Nursing to continue with the same twice weekly -Continue with postoperative shoes or other shoes as tolerated and walker to assist with ambulation -Continue with ciprofloxacin estimated for 1 more week also added on Bactrim for patient to take as instructed -Encouraged to continue with rest and elevation to assist with wound healing and advised Aleve if tolerated for pain and inflammation on right -Return to office after in 1 week for wound care or sooner if problems or issues arise.    Landis Martins, DPM

## 2019-03-02 ENCOUNTER — Telehealth: Payer: Self-pay | Admitting: *Deleted

## 2019-03-02 DIAGNOSIS — I129 Hypertensive chronic kidney disease with stage 1 through stage 4 chronic kidney disease, or unspecified chronic kidney disease: Secondary | ICD-10-CM | POA: Diagnosis not present

## 2019-03-02 DIAGNOSIS — N183 Chronic kidney disease, stage 3 unspecified: Secondary | ICD-10-CM | POA: Diagnosis not present

## 2019-03-02 DIAGNOSIS — I482 Chronic atrial fibrillation, unspecified: Secondary | ICD-10-CM | POA: Diagnosis not present

## 2019-03-02 DIAGNOSIS — E11621 Type 2 diabetes mellitus with foot ulcer: Secondary | ICD-10-CM | POA: Diagnosis not present

## 2019-03-02 DIAGNOSIS — F0390 Unspecified dementia without behavioral disturbance: Secondary | ICD-10-CM | POA: Diagnosis not present

## 2019-03-02 DIAGNOSIS — L97426 Non-pressure chronic ulcer of left heel and midfoot with bone involvement without evidence of necrosis: Secondary | ICD-10-CM | POA: Diagnosis not present

## 2019-03-02 NOTE — Telephone Encounter (Signed)
-----   Message from [place], [place] sent at 03/01/2019 10:09 PM EST ----- Regarding: Wound care orders Betadine right fifth metatarsophalangeal joint and packing to plantar ulcer sites bilateral twice weekly

## 2019-03-02 NOTE — Telephone Encounter (Signed)
Faxed copy of Dr. Leeanne Rio 03/01/2019 10:09pm orders to [facility].

## 2019-03-03 DIAGNOSIS — I129 Hypertensive chronic kidney disease with stage 1 through stage 4 chronic kidney disease, or unspecified chronic kidney disease: Secondary | ICD-10-CM | POA: Diagnosis not present

## 2019-03-03 DIAGNOSIS — N183 Chronic kidney disease, stage 3 unspecified: Secondary | ICD-10-CM | POA: Diagnosis not present

## 2019-03-03 DIAGNOSIS — L97426 Non-pressure chronic ulcer of left heel and midfoot with bone involvement without evidence of necrosis: Secondary | ICD-10-CM | POA: Diagnosis not present

## 2019-03-03 DIAGNOSIS — I482 Chronic atrial fibrillation, unspecified: Secondary | ICD-10-CM | POA: Diagnosis not present

## 2019-03-03 DIAGNOSIS — E11621 Type 2 diabetes mellitus with foot ulcer: Secondary | ICD-10-CM | POA: Diagnosis not present

## 2019-03-03 DIAGNOSIS — F0390 Unspecified dementia without behavioral disturbance: Secondary | ICD-10-CM | POA: Diagnosis not present

## 2019-03-03 LAB — WOUND CULTURE

## 2019-03-05 DIAGNOSIS — E875 Hyperkalemia: Secondary | ICD-10-CM | POA: Diagnosis not present

## 2019-03-05 DIAGNOSIS — I482 Chronic atrial fibrillation, unspecified: Secondary | ICD-10-CM | POA: Diagnosis not present

## 2019-03-05 DIAGNOSIS — L03115 Cellulitis of right lower limb: Secondary | ICD-10-CM | POA: Diagnosis not present

## 2019-03-05 DIAGNOSIS — L97519 Non-pressure chronic ulcer of other part of right foot with unspecified severity: Secondary | ICD-10-CM | POA: Diagnosis not present

## 2019-03-05 DIAGNOSIS — E11649 Type 2 diabetes mellitus with hypoglycemia without coma: Secondary | ICD-10-CM | POA: Diagnosis not present

## 2019-03-05 DIAGNOSIS — Z853 Personal history of malignant neoplasm of breast: Secondary | ICD-10-CM | POA: Diagnosis not present

## 2019-03-05 DIAGNOSIS — E1121 Type 2 diabetes mellitus with diabetic nephropathy: Secondary | ICD-10-CM | POA: Diagnosis not present

## 2019-03-05 DIAGNOSIS — Z794 Long term (current) use of insulin: Secondary | ICD-10-CM | POA: Diagnosis not present

## 2019-03-05 DIAGNOSIS — L03116 Cellulitis of left lower limb: Secondary | ICD-10-CM | POA: Diagnosis not present

## 2019-03-05 DIAGNOSIS — E1165 Type 2 diabetes mellitus with hyperglycemia: Secondary | ICD-10-CM | POA: Diagnosis not present

## 2019-03-05 DIAGNOSIS — S91105D Unspecified open wound of left lesser toe(s) without damage to nail, subsequent encounter: Secondary | ICD-10-CM | POA: Diagnosis not present

## 2019-03-05 DIAGNOSIS — I129 Hypertensive chronic kidney disease with stage 1 through stage 4 chronic kidney disease, or unspecified chronic kidney disease: Secondary | ICD-10-CM | POA: Diagnosis not present

## 2019-03-05 DIAGNOSIS — Z932 Ileostomy status: Secondary | ICD-10-CM | POA: Diagnosis not present

## 2019-03-05 DIAGNOSIS — S91301D Unspecified open wound, right foot, subsequent encounter: Secondary | ICD-10-CM | POA: Diagnosis not present

## 2019-03-05 DIAGNOSIS — F0390 Unspecified dementia without behavioral disturbance: Secondary | ICD-10-CM | POA: Diagnosis not present

## 2019-03-05 DIAGNOSIS — L97426 Non-pressure chronic ulcer of left heel and midfoot with bone involvement without evidence of necrosis: Secondary | ICD-10-CM | POA: Diagnosis not present

## 2019-03-05 DIAGNOSIS — F418 Other specified anxiety disorders: Secondary | ICD-10-CM | POA: Diagnosis not present

## 2019-03-05 DIAGNOSIS — M86171 Other acute osteomyelitis, right ankle and foot: Secondary | ICD-10-CM | POA: Diagnosis not present

## 2019-03-05 DIAGNOSIS — Z8673 Personal history of transient ischemic attack (TIA), and cerebral infarction without residual deficits: Secondary | ICD-10-CM | POA: Diagnosis not present

## 2019-03-05 DIAGNOSIS — E11621 Type 2 diabetes mellitus with foot ulcer: Secondary | ICD-10-CM | POA: Diagnosis not present

## 2019-03-05 DIAGNOSIS — M1990 Unspecified osteoarthritis, unspecified site: Secondary | ICD-10-CM | POA: Diagnosis not present

## 2019-03-05 DIAGNOSIS — R0602 Shortness of breath: Secondary | ICD-10-CM | POA: Diagnosis not present

## 2019-03-05 DIAGNOSIS — N183 Chronic kidney disease, stage 3 unspecified: Secondary | ICD-10-CM | POA: Diagnosis not present

## 2019-03-05 DIAGNOSIS — M86172 Other acute osteomyelitis, left ankle and foot: Secondary | ICD-10-CM | POA: Diagnosis not present

## 2019-03-05 DIAGNOSIS — Z20828 Contact with and (suspected) exposure to other viral communicable diseases: Secondary | ICD-10-CM | POA: Diagnosis not present

## 2019-03-06 DIAGNOSIS — I482 Chronic atrial fibrillation, unspecified: Secondary | ICD-10-CM | POA: Diagnosis not present

## 2019-03-06 DIAGNOSIS — N183 Chronic kidney disease, stage 3 unspecified: Secondary | ICD-10-CM | POA: Diagnosis not present

## 2019-03-06 DIAGNOSIS — I129 Hypertensive chronic kidney disease with stage 1 through stage 4 chronic kidney disease, or unspecified chronic kidney disease: Secondary | ICD-10-CM | POA: Diagnosis not present

## 2019-03-06 DIAGNOSIS — F0390 Unspecified dementia without behavioral disturbance: Secondary | ICD-10-CM | POA: Diagnosis not present

## 2019-03-06 DIAGNOSIS — E11621 Type 2 diabetes mellitus with foot ulcer: Secondary | ICD-10-CM | POA: Diagnosis not present

## 2019-03-06 DIAGNOSIS — L97426 Non-pressure chronic ulcer of left heel and midfoot with bone involvement without evidence of necrosis: Secondary | ICD-10-CM | POA: Diagnosis not present

## 2019-03-08 ENCOUNTER — Other Ambulatory Visit: Payer: Self-pay

## 2019-03-08 ENCOUNTER — Ambulatory Visit (INDEPENDENT_AMBULATORY_CARE_PROVIDER_SITE_OTHER): Payer: Medicare Other | Admitting: Sports Medicine

## 2019-03-08 ENCOUNTER — Encounter: Payer: Self-pay | Admitting: Sports Medicine

## 2019-03-08 DIAGNOSIS — F0150 Vascular dementia without behavioral disturbance: Secondary | ICD-10-CM

## 2019-03-08 DIAGNOSIS — E1142 Type 2 diabetes mellitus with diabetic polyneuropathy: Secondary | ICD-10-CM

## 2019-03-08 DIAGNOSIS — L97512 Non-pressure chronic ulcer of other part of right foot with fat layer exposed: Secondary | ICD-10-CM

## 2019-03-08 DIAGNOSIS — L02619 Cutaneous abscess of unspecified foot: Secondary | ICD-10-CM

## 2019-03-08 DIAGNOSIS — L03119 Cellulitis of unspecified part of limb: Secondary | ICD-10-CM

## 2019-03-08 DIAGNOSIS — L97522 Non-pressure chronic ulcer of other part of left foot with fat layer exposed: Secondary | ICD-10-CM

## 2019-03-08 NOTE — Progress Notes (Signed)
Subjective: Cindy Hahn is a 84 y.o. female patient seen today in office for follow-up evaluation of bilateral foot ulcers. S/p bilateral I&D with removal of bone and placement of antibiotic beads preformed at [facility] on 01-27-19.  Patient that there is some pain on the right foot at the big toe joint which is the worse.  Patient admits to an additional history of fall and reports that she does not feel like the therapist is doing much with her because she is still having trouble walking and admits that when she walks sometimes her body freezes up and is worried about other conditions like Parkinson's.  Patient currently on Bactrim with no other issues.    Patient is assisted by daughter this visit.  Fasting blood sugar 280  PCP visit 3 weeks ago.  Patient Active Problem List   Diagnosis Date Noted  . Osteomyelitis ([place]) 10/17/2018  . Diabetic foot ulcer ([place]) 05/05/2018  . History of CVA (cerebrovascular accident) 05/05/2018  . Dementia ([place]) 05/05/2018  . Diabetic polyneuropathy associated with type 2 diabetes mellitus ([place]) 06/04/2015  . Pacemaker 03/11/2015  . Bradycardia 02/06/2015  . Chronic atrial fibrillation ([place]) 11/01/2014  . Essential hypertension 11/01/2014  . Diabetes mellitus ([place]) 11/01/2014    Current Outpatient Medications on File Prior to Visit  Medication Sig Dispense Refill  . ALPRAZolam (XANAX) 1 MG tablet Take 1 mg by mouth.    Marland Kitchen amoxicillin-clavulanate (AUGMENTIN) 500-125 MG tablet Take 1 tablet (500 mg total) by mouth 2 (two) times daily. 28 tablet 0  . aspirin EC 81 MG tablet Take 81 mg by mouth.    . BD INSULIN SYRINGE U/F 31G X 5/16" 1 ML MISC     . ciprofloxacin (CIPRO) 500 MG tablet Take 1 tablet (500 mg total) by mouth 2 (two) times daily. 20 tablet 0  . digoxin (LANOXIN) 0.125 MG tablet Take 0.0625 mg by mouth daily.   0  . diltiazem (CARDIZEM) 90 MG tablet Take 1 tablet (90 mg total) by mouth 2 (two) times daily. 60 tablet 11  . LEVEMIR  100 UNIT/ML injection Inject 74 Units into the skin.     . metoprolol tartrate (LOPRESSOR) 50 MG tablet Take 75 mg by mouth 2 (two) times daily.   0  . NOVOLOG FLEXPEN 100 UNIT/ML FlexPen inject 10 units SUBCUTANEOUSLY WITH MAIN MEAL  0  . potassium chloride SA (K-DUR,KLOR-CON) 20 MEQ tablet Take 20 mEq by mouth daily.   0  . rosuvastatin (CRESTOR) 20 MG tablet   0  . sertraline (ZOLOFT) 100 MG tablet     . silver nitrate applicators 91-50 % applicator Apply to wound every other day then cover with guaze and papertape 100 each 0  . sulfamethoxazole-trimethoprim (BACTRIM) 400-80 MG tablet Take 1 tablet by mouth 2 (two) times daily. 28 tablet 0   Current Facility-Administered Medications on File Prior to Visit  Medication Dose Route Frequency Provider Last Rate Last Dose  . triamcinolone acetonide (KENALOG) 10 MG/ML injection 10 mg  10 mg Other Once Landis Martins, DPM        Allergies  Allergen Reactions  . Levofloxacin Other (See Comments)    confusion    Objective: There were no vitals filed for this visit.  General: No acute distress, AAOx3  To the left sub met 1 there is healed pre-ulcerative callus.  To the right foot there is a full-thickness ulcer present sub-met 1 that measures 1 x1 x0.4 cm with a granular base with mild swelling  erythema and edema dorsal sutures intact.  To the left heel that measures 1.0cm width x1.5 cm in length x 0.4cm probes to soft tissue in range area feels like it is filling in with a granular tissue and no exposed antibiotic beads the left heel wound bed is granular with mild periwound maceration, to the right foot blister at the fifth metatarsophalangeal joint appears to be well-healed.  There is a new ulceration noted to the right third toe partial thickness measuring less than 0.5 cm with a granular base with no surrounding signs of infection however there is mild soft tissue swelling to the toe.  No purulence no redness no warmth.  There is + pain to left  heel and sub-met 1 on right with swelling of the forefoot.Neurovascular unchanged from prior.  No reproduction in clonic activity however patient does admits that when she is walking or grabbing for something her emotions abruptly stopped.   Assessment and Plan:  Problem List Items Addressed This Visit      Endocrine   Diabetic polyneuropathy associated with type 2 diabetes mellitus ([place])     Nervous and Auditory   Dementia ([place])    Other Visit Diagnoses    Foot ulcer, right, with fat layer exposed ([place])    -  Primary   Foot ulcer with fat layer exposed, left ([place])       Cellulitis and abscess of foot, except toes          -Patient seen and evaluated -Applied Medihoney to right third toe and packing to plantar ulcerations all covered with dry dressing.  Nursing to continue with the same twice weekly. -Dementia continue with physical therapy and progress therapy as patient can tolerate it patient also needs to focus on strengthening and stability when walking to assist with preventing against falls -Recommend neurology consult for further work-up to rule out any type of neuromuscular condition that could be contributing to falls since patient describes some clonic activity however this activity is not reproduced on exam -Continue with postoperative shoes or other shoes as tolerated and walker to assist with ambulation -Continue with Bactrim -Encouraged to continue with rest and elevation to assist with wound healing and advised Aleve if tolerated for pain and inflammation on right -Return to office after in 1 week for wound care or sooner if problems or issues arise.    Landis Martins, DPM

## 2019-03-09 ENCOUNTER — Telehealth: Payer: Self-pay | Admitting: *Deleted

## 2019-03-09 DIAGNOSIS — F0150 Vascular dementia without behavioral disturbance: Secondary | ICD-10-CM

## 2019-03-09 DIAGNOSIS — R296 Repeated falls: Secondary | ICD-10-CM

## 2019-03-09 DIAGNOSIS — E1142 Type 2 diabetes mellitus with diabetic polyneuropathy: Secondary | ICD-10-CM

## 2019-03-09 DIAGNOSIS — L97522 Non-pressure chronic ulcer of other part of left foot with fat layer exposed: Secondary | ICD-10-CM

## 2019-03-09 DIAGNOSIS — L97512 Non-pressure chronic ulcer of other part of right foot with fat layer exposed: Secondary | ICD-10-CM

## 2019-03-09 NOTE — Telephone Encounter (Signed)
Faxed clinicals for 6 months, with referral and demographics to [facility] Neurology.

## 2019-03-09 NOTE — Telephone Encounter (Signed)
-----   Message from Landis Martins, [place] sent at 03/08/2019  9:39 PM EST ----- Regarding: Neuro Consult neurology consult for further work-up to rule out any type of neuromuscular condition that could be contributing to falls since patient describes some clonic activity however this activity is not reproduced on exam

## 2019-03-15 ENCOUNTER — Ambulatory Visit (INDEPENDENT_AMBULATORY_CARE_PROVIDER_SITE_OTHER): Payer: Medicare Other | Admitting: Sports Medicine

## 2019-03-15 ENCOUNTER — Other Ambulatory Visit: Payer: Self-pay

## 2019-03-15 ENCOUNTER — Encounter: Payer: Self-pay | Admitting: Sports Medicine

## 2019-03-15 DIAGNOSIS — E1142 Type 2 diabetes mellitus with diabetic polyneuropathy: Secondary | ICD-10-CM | POA: Diagnosis not present

## 2019-03-15 DIAGNOSIS — F0150 Vascular dementia without behavioral disturbance: Secondary | ICD-10-CM | POA: Diagnosis not present

## 2019-03-15 DIAGNOSIS — R296 Repeated falls: Secondary | ICD-10-CM

## 2019-03-15 DIAGNOSIS — L97512 Non-pressure chronic ulcer of other part of right foot with fat layer exposed: Secondary | ICD-10-CM

## 2019-03-15 DIAGNOSIS — L97522 Non-pressure chronic ulcer of other part of left foot with fat layer exposed: Secondary | ICD-10-CM

## 2019-03-15 MED ORDER — FLUCONAZOLE 150 MG PO TABS: 150.0000 mg | ORAL_TABLET | Freq: Once | ORAL | 0 refills | Status: AC

## 2019-03-15 MED ORDER — GABAPENTIN 100 MG PO CAPS: 100.0000 mg | ORAL_CAPSULE | Freq: Every day | ORAL | 3 refills | Status: AC

## 2019-03-15 NOTE — Progress Notes (Signed)
Subjective: NATISHA TRZCINSKI is a 84 y.o. female patient seen today in office for follow-up evaluation of bilateral foot ulcers. S/p bilateral I&D with removal of bone and placement of antibiotic beads preformed at [facility] on 01-27-19.  Patient that there is some pain on the right foot at the arch which is new sharp shooting and seems like it is getting worse.  Patient admits to an additional history of fall and reports a fall that just happened on Monday.  Patient currently on Bactrim with no other issues.    Patient is assisted by daughter this visit.  Fasting blood sugar 303  PCP visit for weeks ago.  Patient Active Problem List   Diagnosis Date Noted  . Osteomyelitis ([place]) 10/17/2018  . Diabetic foot ulcer ([place]) 05/05/2018  . History of CVA (cerebrovascular accident) 05/05/2018  . Dementia ([place]) 05/05/2018  . Diabetic polyneuropathy associated with type 2 diabetes mellitus ([place]) 06/04/2015  . Pacemaker 03/11/2015  . Bradycardia 02/06/2015  . Chronic atrial fibrillation ([place]) 11/01/2014  . Essential hypertension 11/01/2014  . Diabetes mellitus ([place]) 11/01/2014    Current Outpatient Medications on File Prior to Visit  Medication Sig Dispense Refill  . ALPRAZolam (XANAX) 1 MG tablet Take 1 mg by mouth.    Marland Kitchen amoxicillin-clavulanate (AUGMENTIN) 500-125 MG tablet Take 1 tablet (500 mg total) by mouth 2 (two) times daily. 28 tablet 0  . aspirin EC 81 MG tablet Take 81 mg by mouth.    . BD INSULIN SYRINGE U/F 31G X 5/16" 1 ML MISC     . ciprofloxacin (CIPRO) 500 MG tablet Take 1 tablet (500 mg total) by mouth 2 (two) times daily. 20 tablet 0  . digoxin (LANOXIN) 0.125 MG tablet Take 0.0625 mg by mouth daily.   0  . diltiazem (CARDIZEM) 90 MG tablet Take 1 tablet (90 mg total) by mouth 2 (two) times daily. 60 tablet 11  . LEVEMIR 100 UNIT/ML injection Inject 74 Units into the skin.     . metoprolol tartrate (LOPRESSOR) 50 MG tablet Take 75 mg by mouth 2 (two) times daily.   0   . NOVOLOG FLEXPEN 100 UNIT/ML FlexPen inject 10 units SUBCUTANEOUSLY WITH MAIN MEAL  0  . potassium chloride SA (K-DUR,KLOR-CON) 20 MEQ tablet Take 20 mEq by mouth daily.   0  . rosuvastatin (CRESTOR) 20 MG tablet   0  . sertraline (ZOLOFT) 100 MG tablet     . silver nitrate applicators 52-77 % applicator Apply to wound every other day then cover with guaze and papertape 100 each 0  . sulfamethoxazole-trimethoprim (BACTRIM) 400-80 MG tablet Take 1 tablet by mouth 2 (two) times daily. 28 tablet 0   Current Facility-Administered Medications on File Prior to Visit  Medication Dose Route Frequency Provider Last Rate Last Dose  . triamcinolone acetonide (KENALOG) 10 MG/ML injection 10 mg  10 mg Other Once Landis Martins, DPM        Allergies  Allergen Reactions  . Levofloxacin Other (See Comments)    confusion    Objective: There were no vitals filed for this visit.  General: No acute distress, AAOx3  To the left sub met 1 there is healed pre-ulcerative callus.  To the right foot there is a full-thickness ulcer present sub-met 1 that measures 0.8 x1 x0.4 cm with a granular base with mild swelling erythema and edema dorsal sutures intact.  To the left heel that measures 1.0cm width x1.2 cm in length x 0.4cm probes to soft tissue in  range area feels like it is filling in with a granular tissue and no exposed antibiotic beads the left heel wound bed is granular with mild periwound maceration, to the right foot blister at the fifth metatarsophalangeal joint appears to be well-healed.  There is a new ulceration noted to the right third toe partial thickness measuring less than 0.4cm with a granular base with no surrounding signs of infection however there is mild soft tissue swelling to the toe.  No purulence no redness no warmth.  There is also mild dehiscence noted at the right great metatarsophalangeal joint incision site with a pinpoint area of fibrotic tissue noted.  There is + pain to left heel  and sub-met 1 on right with swelling of the forefoot.Neurovascular unchanged from prior. There is new pain extending into the right arch.  History of fall with gait instability last fall was on Monday.  Assessment and Plan:  Problem List Items Addressed This Visit      Endocrine   Diabetic polyneuropathy associated with type 2 diabetes mellitus ([place])   Relevant Medications   gabapentin (NEURONTIN) 100 MG capsule     Nervous and Auditory   Dementia ([facility])   Relevant Medications   gabapentin (NEURONTIN) 100 MG capsule    Other Visit Diagnoses    Foot ulcer, right, with fat layer exposed ([place])    -  Primary   Foot ulcer with fat layer exposed, left ([place])       Frequent falls          -Patient seen and evaluated -Applied Prisma to dorsal right first metatarsophalangeal joint incision site and Iodosorb to right third toe and packing to plantar ulcerations all covered with dry dressing.  Nursing to continue with the same twice weekly. -Continue with postoperative shoes or other shoes as tolerated and walker to assist with ambulation -Continue with Bactrim until completed refill Diflucan -Prescribed low-dose gabapentin for sharp pain in right arch -Encouraged to continue with rest and elevation to assist with wound healing weekly -Return to office after in 1 week for wound care or sooner if problems or issues arise.    Landis Martins, DPM

## 2019-03-22 ENCOUNTER — Encounter: Payer: Medicare Other | Admitting: Sports Medicine

## 2019-03-23 ENCOUNTER — Encounter: Payer: Medicare Other | Admitting: Sports Medicine

## 2019-03-30 ENCOUNTER — Encounter: Payer: Self-pay | Admitting: Sports Medicine

## 2019-03-30 ENCOUNTER — Other Ambulatory Visit: Payer: Self-pay

## 2019-03-30 ENCOUNTER — Telehealth: Payer: Self-pay | Admitting: *Deleted

## 2019-03-30 ENCOUNTER — Ambulatory Visit (INDEPENDENT_AMBULATORY_CARE_PROVIDER_SITE_OTHER): Payer: Self-pay | Admitting: Sports Medicine

## 2019-03-30 DIAGNOSIS — L97512 Non-pressure chronic ulcer of other part of right foot with fat layer exposed: Secondary | ICD-10-CM

## 2019-03-30 DIAGNOSIS — E113412 Type 2 diabetes mellitus with severe nonproliferative diabetic retinopathy with macular edema, left eye: Secondary | ICD-10-CM | POA: Diagnosis not present

## 2019-03-30 DIAGNOSIS — E1142 Type 2 diabetes mellitus with diabetic polyneuropathy: Secondary | ICD-10-CM

## 2019-03-30 DIAGNOSIS — L97522 Non-pressure chronic ulcer of other part of left foot with fat layer exposed: Secondary | ICD-10-CM

## 2019-03-30 NOTE — Telephone Encounter (Signed)
Faxed copy of Dr. Leeanne Rio 03/30/2019 2:43pm orders to [facility].

## 2019-03-30 NOTE — Progress Notes (Signed)
Subjective: Cindy Hahn is a 84 y.o. female patient seen today in office for follow-up evaluation of bilateral foot ulcers. S/p bilateral I&D with removal of bone and placement of antibiotic beads preformed at [facility] on 01-27-19.  Patient reports that she is having back pain at the tops of both feet and difficulty with bending toes reports that her right foot was very swollen and reports that she has a new ulcer within the ulcer on the left heel. Patient is assisted by daughter this visit who reports that her mom was unable to tolerate gabapentin longer than a week for the sharp pains in her arch so she has stopped taking it.  Fasting blood sugar 151  PCP visit 1 month ago and will go see the eye doctor today.  Patient Active Problem List   Diagnosis Date Noted  . Osteomyelitis ([place]) 10/17/2018  . Diabetic foot ulcer ([place]) 05/05/2018  . History of CVA (cerebrovascular accident) 05/05/2018  . Dementia ([place]) 05/05/2018  . Diabetic polyneuropathy associated with type 2 diabetes mellitus ([place]) 06/04/2015  . Pacemaker 03/11/2015  . Bradycardia 02/06/2015  . Chronic atrial fibrillation ([place]) 11/01/2014  . Essential hypertension 11/01/2014  . Diabetes mellitus ([place]) 11/01/2014    Current Outpatient Medications on File Prior to Visit  Medication Sig Dispense Refill  . ALPRAZolam (XANAX) 1 MG tablet Take 1 mg by mouth.    Marland Kitchen amoxicillin-clavulanate (AUGMENTIN) 500-125 MG tablet Take 1 tablet (500 mg total) by mouth 2 (two) times daily. 28 tablet 0  . aspirin EC 81 MG tablet Take 81 mg by mouth.    . BD INSULIN SYRINGE U/F 31G X 5/16" 1 ML MISC     . ciprofloxacin (CIPRO) 500 MG tablet Take 1 tablet (500 mg total) by mouth 2 (two) times daily. 20 tablet 0  . digoxin (LANOXIN) 0.125 MG tablet Take 0.0625 mg by mouth daily.   0  . diltiazem (CARDIZEM) 90 MG tablet Take 1 tablet (90 mg total) by mouth 2 (two) times daily. 60 tablet 11  . gabapentin (NEURONTIN) 100 MG capsule Take 1  capsule (100 mg total) by mouth at bedtime. 30 capsule 3  . LEVEMIR 100 UNIT/ML injection Inject 74 Units into the skin.     . metoprolol tartrate (LOPRESSOR) 50 MG tablet Take 75 mg by mouth 2 (two) times daily.   0  . NOVOLOG FLEXPEN 100 UNIT/ML FlexPen inject 10 units SUBCUTANEOUSLY WITH MAIN MEAL  0  . potassium chloride SA (K-DUR,KLOR-CON) 20 MEQ tablet Take 20 mEq by mouth daily.   0  . rosuvastatin (CRESTOR) 20 MG tablet   0  . sertraline (ZOLOFT) 100 MG tablet     . silver nitrate applicators 44-03 % applicator Apply to wound every other day then cover with guaze and papertape 100 each 0  . sulfamethoxazole-trimethoprim (BACTRIM) 400-80 MG tablet Take 1 tablet by mouth 2 (two) times daily. 28 tablet 0   Current Facility-Administered Medications on File Prior to Visit  Medication Dose Route Frequency Provider Last Rate Last Admin  . triamcinolone acetonide (KENALOG) 10 MG/ML injection 10 mg  10 mg Other Once Landis Martins, DPM        Allergies  Allergen Reactions  . Levofloxacin Other (See Comments)    confusion    Objective: There were no vitals filed for this visit.  General: No acute distress, AAOx3  To the left sub met 1 there is healed pre-ulcerative callus.  To the right foot there is a partial-thickness ulcer  present sub-met 1 that measures 0.5 x 0.7 x0.1 cm with a granular base with mild swelling erythema and edema dorsal sutures intact.  To the left heel that measures 2cm width x1.8 cm in length x 2cm probes to soft tissue in range area probes to capsule granular in nature there are multiple scrapes bilateral and dry scab at the right third toe and dorsal first MPJs bilateral right greater than left with no signs of infection.  There is diffuse swelling forefoot bilateral right greater than left.  There is + pain to left heel and with range of motion at right great toe joint.  Neurovascular unchanged from prior.  Assessment and Plan:  Problem List Items Addressed This  Visit      Endocrine   Diabetic polyneuropathy associated with type 2 diabetes mellitus ([place])    Other Visit Diagnoses    Foot ulcer, right, with fat layer exposed ([place])    -  Primary   Foot ulcer with fat layer exposed, left ([place])          -Patient seen and evaluated -Applied gauze packing to left heel and Iodosorb to all other ulcerated and scabbed over lesion areas.  Orders were sent for home nurse to apply Acticoat rope to the left plantar heel and Iodosorb to all other ulcerations at minimum twice weekly. -Continue with postoperative shoes or other shoes as tolerated and walker to assist with ambulation; added heel lift to shoes at today's visit -Encouraged to continue with rest and elevation to assist with wound healing -Advised patient to follow-up with cardiology regarding swelling and abnormal lab work -Return to office after in 2 weeks for wound care or sooner if problems or issues arise.    Landis Martins, DPM

## 2019-03-30 NOTE — Telephone Encounter (Signed)
-----   Message from [place], [place] sent at 03/30/2019  2:43 PM EST ----- Regarding: Wound care orders [place] rope to left heel and iodosorb to all other wound areas bilateral. Cover with dry dressing. Dressings should be changed at least 2x per week Thanks Dr. Cannon Kettle

## 2019-03-31 ENCOUNTER — Encounter: Payer: Medicare Other | Admitting: Sports Medicine

## 2019-04-04 DIAGNOSIS — M86171 Other acute osteomyelitis, right ankle and foot: Secondary | ICD-10-CM | POA: Diagnosis not present

## 2019-04-04 DIAGNOSIS — L97519 Non-pressure chronic ulcer of other part of right foot with unspecified severity: Secondary | ICD-10-CM | POA: Diagnosis not present

## 2019-04-04 DIAGNOSIS — I129 Hypertensive chronic kidney disease with stage 1 through stage 4 chronic kidney disease, or unspecified chronic kidney disease: Secondary | ICD-10-CM | POA: Diagnosis not present

## 2019-04-04 DIAGNOSIS — E11649 Type 2 diabetes mellitus with hypoglycemia without coma: Secondary | ICD-10-CM | POA: Diagnosis not present

## 2019-04-04 DIAGNOSIS — L97426 Non-pressure chronic ulcer of left heel and midfoot with bone involvement without evidence of necrosis: Secondary | ICD-10-CM | POA: Diagnosis not present

## 2019-04-04 DIAGNOSIS — S91105D Unspecified open wound of left lesser toe(s) without damage to nail, subsequent encounter: Secondary | ICD-10-CM | POA: Diagnosis not present

## 2019-04-04 DIAGNOSIS — F418 Other specified anxiety disorders: Secondary | ICD-10-CM | POA: Diagnosis not present

## 2019-04-04 DIAGNOSIS — F0390 Unspecified dementia without behavioral disturbance: Secondary | ICD-10-CM | POA: Diagnosis not present

## 2019-04-04 DIAGNOSIS — Z20828 Contact with and (suspected) exposure to other viral communicable diseases: Secondary | ICD-10-CM | POA: Diagnosis not present

## 2019-04-04 DIAGNOSIS — L03115 Cellulitis of right lower limb: Secondary | ICD-10-CM | POA: Diagnosis not present

## 2019-04-04 DIAGNOSIS — S91301D Unspecified open wound, right foot, subsequent encounter: Secondary | ICD-10-CM | POA: Diagnosis not present

## 2019-04-04 DIAGNOSIS — E1165 Type 2 diabetes mellitus with hyperglycemia: Secondary | ICD-10-CM | POA: Diagnosis not present

## 2019-04-04 DIAGNOSIS — Z932 Ileostomy status: Secondary | ICD-10-CM | POA: Diagnosis not present

## 2019-04-04 DIAGNOSIS — L03116 Cellulitis of left lower limb: Secondary | ICD-10-CM | POA: Diagnosis not present

## 2019-04-04 DIAGNOSIS — E1121 Type 2 diabetes mellitus with diabetic nephropathy: Secondary | ICD-10-CM | POA: Diagnosis not present

## 2019-04-04 DIAGNOSIS — E875 Hyperkalemia: Secondary | ICD-10-CM | POA: Diagnosis not present

## 2019-04-04 DIAGNOSIS — I482 Chronic atrial fibrillation, unspecified: Secondary | ICD-10-CM | POA: Diagnosis not present

## 2019-04-04 DIAGNOSIS — Z794 Long term (current) use of insulin: Secondary | ICD-10-CM | POA: Diagnosis not present

## 2019-04-04 DIAGNOSIS — Z8673 Personal history of transient ischemic attack (TIA), and cerebral infarction without residual deficits: Secondary | ICD-10-CM | POA: Diagnosis not present

## 2019-04-04 DIAGNOSIS — Z853 Personal history of malignant neoplasm of breast: Secondary | ICD-10-CM | POA: Diagnosis not present

## 2019-04-04 DIAGNOSIS — E11621 Type 2 diabetes mellitus with foot ulcer: Secondary | ICD-10-CM | POA: Diagnosis not present

## 2019-04-04 DIAGNOSIS — N183 Chronic kidney disease, stage 3 unspecified: Secondary | ICD-10-CM | POA: Diagnosis not present

## 2019-04-04 DIAGNOSIS — R0602 Shortness of breath: Secondary | ICD-10-CM | POA: Diagnosis not present

## 2019-04-04 DIAGNOSIS — M86172 Other acute osteomyelitis, left ankle and foot: Secondary | ICD-10-CM | POA: Diagnosis not present

## 2019-04-04 DIAGNOSIS — M1990 Unspecified osteoarthritis, unspecified site: Secondary | ICD-10-CM | POA: Diagnosis not present

## 2019-04-07 DIAGNOSIS — L97426 Non-pressure chronic ulcer of left heel and midfoot with bone involvement without evidence of necrosis: Secondary | ICD-10-CM | POA: Diagnosis not present

## 2019-04-07 DIAGNOSIS — I129 Hypertensive chronic kidney disease with stage 1 through stage 4 chronic kidney disease, or unspecified chronic kidney disease: Secondary | ICD-10-CM | POA: Diagnosis not present

## 2019-04-07 DIAGNOSIS — F0390 Unspecified dementia without behavioral disturbance: Secondary | ICD-10-CM | POA: Diagnosis not present

## 2019-04-07 DIAGNOSIS — N183 Chronic kidney disease, stage 3 unspecified: Secondary | ICD-10-CM | POA: Diagnosis not present

## 2019-04-07 DIAGNOSIS — E11621 Type 2 diabetes mellitus with foot ulcer: Secondary | ICD-10-CM | POA: Diagnosis not present

## 2019-04-07 DIAGNOSIS — I482 Chronic atrial fibrillation, unspecified: Secondary | ICD-10-CM | POA: Diagnosis not present

## 2019-04-10 DIAGNOSIS — F0390 Unspecified dementia without behavioral disturbance: Secondary | ICD-10-CM | POA: Diagnosis not present

## 2019-04-10 DIAGNOSIS — E11621 Type 2 diabetes mellitus with foot ulcer: Secondary | ICD-10-CM | POA: Diagnosis not present

## 2019-04-10 DIAGNOSIS — I129 Hypertensive chronic kidney disease with stage 1 through stage 4 chronic kidney disease, or unspecified chronic kidney disease: Secondary | ICD-10-CM | POA: Diagnosis not present

## 2019-04-10 DIAGNOSIS — I482 Chronic atrial fibrillation, unspecified: Secondary | ICD-10-CM | POA: Diagnosis not present

## 2019-04-10 DIAGNOSIS — N183 Chronic kidney disease, stage 3 unspecified: Secondary | ICD-10-CM | POA: Diagnosis not present

## 2019-04-10 DIAGNOSIS — L97426 Non-pressure chronic ulcer of left heel and midfoot with bone involvement without evidence of necrosis: Secondary | ICD-10-CM | POA: Diagnosis not present

## 2019-04-12 ENCOUNTER — Ambulatory Visit (INDEPENDENT_AMBULATORY_CARE_PROVIDER_SITE_OTHER): Payer: Medicare Other | Admitting: Sports Medicine

## 2019-04-12 ENCOUNTER — Encounter: Payer: Self-pay | Admitting: Sports Medicine

## 2019-04-12 ENCOUNTER — Other Ambulatory Visit: Payer: Self-pay

## 2019-04-12 DIAGNOSIS — F0150 Vascular dementia without behavioral disturbance: Secondary | ICD-10-CM

## 2019-04-12 DIAGNOSIS — E1142 Type 2 diabetes mellitus with diabetic polyneuropathy: Secondary | ICD-10-CM

## 2019-04-12 DIAGNOSIS — L97522 Non-pressure chronic ulcer of other part of left foot with fat layer exposed: Secondary | ICD-10-CM

## 2019-04-12 DIAGNOSIS — L97511 Non-pressure chronic ulcer of other part of right foot limited to breakdown of skin: Secondary | ICD-10-CM

## 2019-04-12 NOTE — Progress Notes (Signed)
Subjective: Cindy Hahn is a 84 y.o. female patient seen today in office for follow-up evaluation of bilateral foot ulcers. S/p bilateral I&D with removal of bone and placement of antibiotic beads preformed at [facility] on 01-27-19.  Patient reports that she is doing better and that the swelling is better. Daughter admits some slimy discharge from the heel once after the nurses used the calcium alginate dressing since they did not have the rope dressing.   Fasting blood sugar 150  Patient Active Problem List   Diagnosis Date Noted  . Osteomyelitis ([place]) 10/17/2018  . Diabetic foot ulcer ([place]) 05/05/2018  . History of CVA (cerebrovascular accident) 05/05/2018  . Dementia ([place]) 05/05/2018  . Diabetic polyneuropathy associated with type 2 diabetes mellitus ([place]) 06/04/2015  . Pacemaker 03/11/2015  . Bradycardia 02/06/2015  . Chronic atrial fibrillation ([place]) 11/01/2014  . Essential hypertension 11/01/2014  . Diabetes mellitus ([place]) 11/01/2014    Current Outpatient Medications on File Prior to Visit  Medication Sig Dispense Refill  . ALPRAZolam (XANAX) 1 MG tablet Take 1 mg by mouth.    Marland Kitchen amoxicillin-clavulanate (AUGMENTIN) 500-125 MG tablet Take 1 tablet (500 mg total) by mouth 2 (two) times daily. 28 tablet 0  . aspirin EC 81 MG tablet Take 81 mg by mouth.    . BD INSULIN SYRINGE U/F 31G X 5/16" 1 ML MISC     . ciprofloxacin (CIPRO) 500 MG tablet Take 1 tablet (500 mg total) by mouth 2 (two) times daily. 20 tablet 0  . digoxin (LANOXIN) 0.125 MG tablet Take 0.0625 mg by mouth daily.   0  . diltiazem (CARDIZEM) 90 MG tablet Take 1 tablet (90 mg total) by mouth 2 (two) times daily. 60 tablet 11  . gabapentin (NEURONTIN) 100 MG capsule Take 1 capsule (100 mg total) by mouth at bedtime. 30 capsule 3  . LEVEMIR 100 UNIT/ML injection Inject 74 Units into the skin.     . metoprolol tartrate (LOPRESSOR) 50 MG tablet Take 75 mg by mouth 2 (two) times daily.   0  . NOVOLOG FLEXPEN 100  UNIT/ML FlexPen inject 10 units SUBCUTANEOUSLY WITH MAIN MEAL  0  . potassium chloride SA (K-DUR,KLOR-CON) 20 MEQ tablet Take 20 mEq by mouth daily.   0  . rosuvastatin (CRESTOR) 20 MG tablet   0  . sertraline (ZOLOFT) 100 MG tablet     . silver nitrate applicators 16-10 % applicator Apply to wound every other day then cover with guaze and papertape 100 each 0  . sulfamethoxazole-trimethoprim (BACTRIM) 400-80 MG tablet Take 1 tablet by mouth 2 (two) times daily. 28 tablet 0   Current Facility-Administered Medications on File Prior to Visit  Medication Dose Route Frequency Provider Last Rate Last Admin  . triamcinolone acetonide (KENALOG) 10 MG/ML injection 10 mg  10 mg Other Once Landis Martins, DPM        Allergies  Allergen Reactions  . Levofloxacin Other (See Comments)    confusion    Objective: There were no vitals filed for this visit.  General: No acute distress, AAOx3  To the left sub met 1 there is healed pre-ulcerative callus.  To the right foot there is a partial-thickness ulcer present sub-met 1 that measures 0.1 x 0.1 x0.1 cm with a granular base with mild swelling that is much improved, resolved erythema.  To the left heel that measures 1.5cm width x1.5 cm in length x 1.5cm probes to soft tissue in range area probes to capsule granular in nature  there are multiple scrapes bilateral and dry scab at the right third toe and dorsal first MPJs bilateral right greater than left with no signs of infection.  There is no reproducible pain except at left heel ulcer. Neurovascular unchanged from prior.  Assessment and Plan:  Problem List Items Addressed This Visit      Endocrine   Diabetic polyneuropathy associated with type 2 diabetes mellitus ([place])     Nervous and Auditory   Dementia ([place])    Other Visit Diagnoses    Right foot ulcer, limited to breakdown of skin ([employer])    -  Primary   Foot ulcer with fat layer exposed, left ([place])          -Patient seen and  evaluated -Wound check performed -Recommend glycemic control to assist with wound healing and multivitamins -Applied gauze packing to left heel and betadine to all other ulcerated and scabbed over lesion areas.  Orders were sent for home nurse to apply Acticoat rope or similar alginate dressing to the left plantar heel and betadine or iodorsorb to all other ulcerations at minimum twice weekly. -Encouraged to continue with rest and elevation to assist with wound healing and good supportive shoes like before -Patient is awaiting neurology and cardiology follow up  -Return to office after in 2 weeks for wound care or sooner if problems or issues arise.    Landis Martins, DPM

## 2019-04-17 ENCOUNTER — Ambulatory Visit (INDEPENDENT_AMBULATORY_CARE_PROVIDER_SITE_OTHER): Payer: Medicare Other | Admitting: *Deleted

## 2019-04-17 DIAGNOSIS — N183 Chronic kidney disease, stage 3 unspecified: Secondary | ICD-10-CM | POA: Diagnosis not present

## 2019-04-17 DIAGNOSIS — I482 Chronic atrial fibrillation, unspecified: Secondary | ICD-10-CM | POA: Diagnosis not present

## 2019-04-17 DIAGNOSIS — E11621 Type 2 diabetes mellitus with foot ulcer: Secondary | ICD-10-CM | POA: Diagnosis not present

## 2019-04-17 DIAGNOSIS — L97426 Non-pressure chronic ulcer of left heel and midfoot with bone involvement without evidence of necrosis: Secondary | ICD-10-CM | POA: Diagnosis not present

## 2019-04-17 DIAGNOSIS — I129 Hypertensive chronic kidney disease with stage 1 through stage 4 chronic kidney disease, or unspecified chronic kidney disease: Secondary | ICD-10-CM | POA: Diagnosis not present

## 2019-04-17 DIAGNOSIS — F0390 Unspecified dementia without behavioral disturbance: Secondary | ICD-10-CM | POA: Diagnosis not present

## 2019-04-18 LAB — CUP PACEART REMOTE DEVICE CHECK
Battery Impedance: 723 Ohm
Battery Remaining Longevity: 79 mo
Battery Voltage: 2.77 V
Brady Statistic RV Percent Paced: 13 %
Date Time Interrogation Session: 20201228200320
Implantable Lead Implant Date: 20161027
Implantable Lead Location: 753860
Implantable Lead Model: 4092
Implantable Pulse Generator Implant Date: 20161027
Lead Channel Impedance Value: 0 Ohm
Lead Channel Impedance Value: 585 Ohm
Lead Channel Pacing Threshold Amplitude: 0.5 V
Lead Channel Pacing Threshold Pulse Width: 0.4 ms
Lead Channel Setting Pacing Amplitude: 2 V
Lead Channel Setting Pacing Pulse Width: 0.4 ms
Lead Channel Setting Sensing Sensitivity: 2 mV

## 2019-04-20 DIAGNOSIS — L97426 Non-pressure chronic ulcer of left heel and midfoot with bone involvement without evidence of necrosis: Secondary | ICD-10-CM | POA: Diagnosis not present

## 2019-04-20 DIAGNOSIS — I129 Hypertensive chronic kidney disease with stage 1 through stage 4 chronic kidney disease, or unspecified chronic kidney disease: Secondary | ICD-10-CM | POA: Diagnosis not present

## 2019-04-20 DIAGNOSIS — N183 Chronic kidney disease, stage 3 unspecified: Secondary | ICD-10-CM | POA: Diagnosis not present

## 2019-04-20 DIAGNOSIS — F0390 Unspecified dementia without behavioral disturbance: Secondary | ICD-10-CM | POA: Diagnosis not present

## 2019-04-20 DIAGNOSIS — I482 Chronic atrial fibrillation, unspecified: Secondary | ICD-10-CM | POA: Diagnosis not present

## 2019-04-20 DIAGNOSIS — E11621 Type 2 diabetes mellitus with foot ulcer: Secondary | ICD-10-CM | POA: Diagnosis not present

## 2019-04-23 DIAGNOSIS — Z03818 Encounter for observation for suspected exposure to other biological agents ruled out: Secondary | ICD-10-CM | POA: Diagnosis not present

## 2019-04-25 ENCOUNTER — Telehealth: Payer: Self-pay | Admitting: Urology

## 2019-04-25 NOTE — Telephone Encounter (Signed)
If her send out is not back then she will have to reschedule. Patient has home nurse and daughter who can continue to help with wound care. Get her rescheduled for the time that her daughter can bring her, if her test results are not back prior to her appt tomorrow

## 2019-04-25 NOTE — Telephone Encounter (Signed)
Pt had a rapid covid test on Sunday and it came back negative but they are still waiting on the send out result. So the pt's daughter called wanting to know if she should still come to her appt tomrrow I told her I would talk to you and lets see if her send out test would come back today. Daughter says she doesn't have  Day off next week to bring her. Please Advise! Thanks!

## 2019-04-26 ENCOUNTER — Other Ambulatory Visit: Payer: Self-pay

## 2019-04-26 ENCOUNTER — Ambulatory Visit (INDEPENDENT_AMBULATORY_CARE_PROVIDER_SITE_OTHER): Payer: Medicare Other | Admitting: Sports Medicine

## 2019-04-26 DIAGNOSIS — L97522 Non-pressure chronic ulcer of other part of left foot with fat layer exposed: Secondary | ICD-10-CM

## 2019-04-26 DIAGNOSIS — E1142 Type 2 diabetes mellitus with diabetic polyneuropathy: Secondary | ICD-10-CM

## 2019-04-26 DIAGNOSIS — F0150 Vascular dementia without behavioral disturbance: Secondary | ICD-10-CM

## 2019-04-26 DIAGNOSIS — L97511 Non-pressure chronic ulcer of other part of right foot limited to breakdown of skin: Secondary | ICD-10-CM

## 2019-04-26 NOTE — Progress Notes (Signed)
Subjective: Cindy Hahn is a 85 y.o. female patient seen today in office for follow-up evaluation of bilateral foot ulcers. S/p bilateral I&D with removal of bone and placement of antibiotic beads preformed at [facility] on 01-27-19.  Patient reports that she is doing better and that the swelling is better and has not had any slimy drainage from the heel. Daughter reports that her Covid test was negative.  Denies nausea vomiting fever chills or any other constitutional symptoms at this time.  Fasting blood sugar 130  Patient Active Problem List   Diagnosis Date Noted  . Osteomyelitis ([place]) 10/17/2018  . Diabetic foot ulcer ([place]) 05/05/2018  . History of CVA (cerebrovascular accident) 05/05/2018  . Dementia ([place]) 05/05/2018  . Diabetic polyneuropathy associated with type 2 diabetes mellitus ([place]) 06/04/2015  . Pacemaker 03/11/2015  . Bradycardia 02/06/2015  . Chronic atrial fibrillation ([place]) 11/01/2014  . Essential hypertension 11/01/2014  . Diabetes mellitus ([place]) 11/01/2014    Current Outpatient Medications on File Prior to Visit  Medication Sig Dispense Refill  . ALPRAZolam (XANAX) 1 MG tablet Take 1 mg by mouth.    Marland Kitchen amoxicillin-clavulanate (AUGMENTIN) 500-125 MG tablet Take 1 tablet (500 mg total) by mouth 2 (two) times daily. 28 tablet 0  . aspirin EC 81 MG tablet Take 81 mg by mouth.    . BD INSULIN SYRINGE U/F 31G X 5/16" 1 ML MISC     . ciprofloxacin (CIPRO) 500 MG tablet Take 1 tablet (500 mg total) by mouth 2 (two) times daily. 20 tablet 0  . digoxin (LANOXIN) 0.125 MG tablet Take 0.0625 mg by mouth daily.   0  . diltiazem (CARDIZEM) 90 MG tablet Take 1 tablet (90 mg total) by mouth 2 (two) times daily. 60 tablet 11  . gabapentin (NEURONTIN) 100 MG capsule Take 1 capsule (100 mg total) by mouth at bedtime. 30 capsule 3  . LEVEMIR 100 UNIT/ML injection Inject 74 Units into the skin.     . metoprolol tartrate (LOPRESSOR) 50 MG tablet Take 75 mg by mouth 2 (two)  times daily.   0  . NOVOLOG FLEXPEN 100 UNIT/ML FlexPen inject 10 units SUBCUTANEOUSLY WITH MAIN MEAL  0  . potassium chloride SA (K-DUR,KLOR-CON) 20 MEQ tablet Take 20 mEq by mouth daily.   0  . rosuvastatin (CRESTOR) 20 MG tablet   0  . sertraline (ZOLOFT) 100 MG tablet     . silver nitrate applicators 09-62 % applicator Apply to wound every other day then cover with guaze and papertape 100 each 0  . sulfamethoxazole-trimethoprim (BACTRIM) 400-80 MG tablet Take 1 tablet by mouth 2 (two) times daily. 28 tablet 0   Current Facility-Administered Medications on File Prior to Visit  Medication Dose Route Frequency Provider Last Rate Last Admin  . triamcinolone acetonide (KENALOG) 10 MG/ML injection 10 mg  10 mg Other Once Landis Martins, DPM        Allergies  Allergen Reactions  . Levofloxacin Other (See Comments)    confusion    Objective: There were no vitals filed for this visit.  General: No acute distress, AAOx3  To the left sub met 1 there is healed pre-ulcerative callus.  To the right foot there is a partial-thickness ulcer present sub-met 1 that measures 0.1 x 0.1 x0.1 cm with a granular base with mild swelling that is much improved, resolved erythema.  To the left heel that measures 1.2cm width x1.2 cm in length x 1.0cm probes to soft tissue in range area  probes to capsule granular in nature there are multiple scrapes bilateral and dry scab at the right third toe and dorsal first MPJs bilateral right greater than left with no signs of infection.  There is no reproducible pain bilateral. Neurovascular unchanged from prior.  Assessment and Plan:  Problem List Items Addressed This Visit      Endocrine   Diabetic polyneuropathy associated with type 2 diabetes mellitus ([place])     Nervous and Auditory   Dementia ([place])    Other Visit Diagnoses    Right foot ulcer, limited to breakdown of skin ([place])    -  Primary   Foot ulcer with fat layer exposed, left ([place])          -Patient  seen and evaluated -Wound check performed -Recommend glycemic control to assist with wound healing and multivitamins -Applied gauze packing to left heel and Band-Aid dressing to right -Continue with AlgiSite dressing to left heel with home nursing -Encouraged to continue with rest and elevation to assist with wound healing and good supportive shoes like before -Return to office after in 2 weeks for wound care or sooner if problems or issues arise.    Landis Martins, DPM

## 2019-04-27 DIAGNOSIS — F0390 Unspecified dementia without behavioral disturbance: Secondary | ICD-10-CM | POA: Diagnosis not present

## 2019-04-27 DIAGNOSIS — N183 Chronic kidney disease, stage 3 unspecified: Secondary | ICD-10-CM | POA: Diagnosis not present

## 2019-04-27 DIAGNOSIS — I129 Hypertensive chronic kidney disease with stage 1 through stage 4 chronic kidney disease, or unspecified chronic kidney disease: Secondary | ICD-10-CM | POA: Diagnosis not present

## 2019-04-27 DIAGNOSIS — E11621 Type 2 diabetes mellitus with foot ulcer: Secondary | ICD-10-CM | POA: Diagnosis not present

## 2019-04-27 DIAGNOSIS — L97426 Non-pressure chronic ulcer of left heel and midfoot with bone involvement without evidence of necrosis: Secondary | ICD-10-CM | POA: Diagnosis not present

## 2019-04-27 DIAGNOSIS — I482 Chronic atrial fibrillation, unspecified: Secondary | ICD-10-CM | POA: Diagnosis not present

## 2019-05-01 ENCOUNTER — Telehealth: Payer: Self-pay

## 2019-05-01 DIAGNOSIS — N183 Chronic kidney disease, stage 3 unspecified: Secondary | ICD-10-CM | POA: Diagnosis not present

## 2019-05-01 DIAGNOSIS — L97426 Non-pressure chronic ulcer of left heel and midfoot with bone involvement without evidence of necrosis: Secondary | ICD-10-CM | POA: Diagnosis not present

## 2019-05-01 DIAGNOSIS — F0390 Unspecified dementia without behavioral disturbance: Secondary | ICD-10-CM | POA: Diagnosis not present

## 2019-05-01 DIAGNOSIS — E11621 Type 2 diabetes mellitus with foot ulcer: Secondary | ICD-10-CM | POA: Diagnosis not present

## 2019-05-01 DIAGNOSIS — I129 Hypertensive chronic kidney disease with stage 1 through stage 4 chronic kidney disease, or unspecified chronic kidney disease: Secondary | ICD-10-CM | POA: Diagnosis not present

## 2019-05-01 DIAGNOSIS — I482 Chronic atrial fibrillation, unspecified: Secondary | ICD-10-CM | POA: Diagnosis not present

## 2019-05-01 NOTE — Telephone Encounter (Signed)
Yes may re-draw BMP Thanks

## 2019-05-01 NOTE — Telephone Encounter (Signed)
Tifanny from [facility] called stating/requesting to re-draw Pt's lab work form [facility]. Please advice

## 2019-05-02 DIAGNOSIS — I120 Hypertensive chronic kidney disease with stage 5 chronic kidney disease or end stage renal disease: Secondary | ICD-10-CM | POA: Diagnosis not present

## 2019-05-02 DIAGNOSIS — I5040 Unspecified combined systolic (congestive) and diastolic (congestive) heart failure: Secondary | ICD-10-CM | POA: Diagnosis not present

## 2019-05-02 NOTE — Telephone Encounter (Signed)
Tiffany from [facility] was notified to redraw blood work per Dr. Cannon Kettle

## 2019-05-09 ENCOUNTER — Telehealth (INDEPENDENT_AMBULATORY_CARE_PROVIDER_SITE_OTHER): Payer: Medicare Other | Admitting: Cardiology

## 2019-05-09 ENCOUNTER — Other Ambulatory Visit: Payer: Self-pay

## 2019-05-09 ENCOUNTER — Encounter: Payer: Self-pay | Admitting: Cardiology

## 2019-05-09 ENCOUNTER — Telehealth: Payer: Self-pay | Admitting: Emergency Medicine

## 2019-05-09 VITALS — BP 104/59 | HR 99 | Ht 65.0 in | Wt 136.0 lb

## 2019-05-09 DIAGNOSIS — R0609 Other forms of dyspnea: Secondary | ICD-10-CM

## 2019-05-09 DIAGNOSIS — Z950 Presence of cardiac pacemaker: Secondary | ICD-10-CM

## 2019-05-09 DIAGNOSIS — I482 Chronic atrial fibrillation, unspecified: Secondary | ICD-10-CM

## 2019-05-09 DIAGNOSIS — I10 Essential (primary) hypertension: Secondary | ICD-10-CM

## 2019-05-09 DIAGNOSIS — E1369 Other specified diabetes mellitus with other specified complication: Secondary | ICD-10-CM

## 2019-05-09 DIAGNOSIS — Z8673 Personal history of transient ischemic attack (TIA), and cerebral infarction without residual deficits: Secondary | ICD-10-CM

## 2019-05-09 NOTE — Addendum Note (Signed)
Addended by: Ashok Norris on: 05/09/2019 09:41 AM   Modules accepted: Orders

## 2019-05-09 NOTE — Telephone Encounter (Signed)
Called patient to go over discharge instructions from patient's visit with Dr. Agustin Cree. She needs a 2 month follow up and to return on Thursday for labs. Left message for her to return call so she can be informed of this and scheduled.

## 2019-05-09 NOTE — Progress Notes (Signed)
Virtual Visit via Video Note   This visit type was conducted due to national recommendations for restrictions regarding the COVID-19 Pandemic (e.g. social distancing) in an effort to limit this patient's exposure and mitigate transmission in our community.  Due to her co-morbid illnesses, this patient is at least at moderate risk for complications without adequate follow up.  This format is felt to be most appropriate for this patient at this time.  All issues noted in this document were discussed and addressed.  A limited physical exam was performed with this format.  Please refer to the patient's chart for her consent to telehealth for [facility]  Evaluation Performed:  Follow-up visit  This visit type was conducted due to national recommendations for restrictions regarding the COVID-19 Pandemic (e.g. social distancing).  This format is felt to be most appropriate for this patient at this time.  All issues noted in this document were discussed and addressed.  No physical exam was performed (except for noted visual exam findings with Video Visits).  Please refer to the patient's chart (MyChart message for video visits and phone note for telephone visits) for the patient's consent to telehealth for [facility].  Date:  05/09/2019  ID: Cindy Hahn, DOB 18-Feb-1935, MRN [id number]   Patient Location: [place] [address]   Provider location:   [place] Office  PCP:  Ronita Hipps, MD  Cardiologist:  Jenne Campus, MD     Chief Complaint: Feeling dizzy  History of Present Illness:    SPRUHA WEIGHT is a 85 y.o. female  who presents via audio/video conferencing for a telehealth visit today.  With past medical history significant for diastolic congestive heart failure, history of CVA, permanent atrial fibrillation, hypertension.  He does have a televisit with me today.  We are able to establish video link and is a quite pleasant  conversation.  Biggest complaint she has is dizziness.  She said when she turns her head from side to side things will spin around.  Also when she gets up very quickly similar situation what happened..  Her daughter check her orthostatic changes and there are no significant.  She reports having some falls but she usually falls when she walks she falls backwards.  She did broke her tailbone before because of this.  Denies have any chest pain tightness squeezing pressure burning chest not much shortness of breath.  She is scheduled to have coronavirus vaccine this coming Thursday.   The patient does not have symptoms concerning for COVID-19 infection (fever, chills, cough, or new SHORTNESS OF BREATH).    Prior CV studies:   The following studies were reviewed today:       Past Medical History:  Diagnosis Date  . A-fib ([place])   . Cancer ([place])   . Dementia ([place])   . Diabetes mellitus without complication ([place])   . Hyperlipidemia   . Hypertension   . Stroke [facility])     Past Surgical History:  Procedure Laterality Date  . ABDOMINAL HYSTERECTOMY    . BLADDER SURGERY    . CATARACT EXTRACTION    . ILEOCECETOMY    . KNEE SURGERY    . OOPHORECTOMY    . SHOULDER SURGERY    . WRIST SURGERY       Current Meds  Medication Sig  . ALPRAZolam (XANAX) 1 MG tablet Take 1 mg by mouth.  Marland Kitchen aspirin EC 81 MG tablet Take 81 mg by mouth.  Marland Kitchen  BD INSULIN SYRINGE U/F 31G X 5/16" 1 ML MISC   . digoxin (LANOXIN) 0.125 MG tablet Take 0.0625 mg by mouth daily.   Marland Kitchen diltiazem (CARDIZEM) 90 MG tablet Take 1 tablet (90 mg total) by mouth 2 (two) times daily.  Marland Kitchen gabapentin (NEURONTIN) 100 MG capsule Take 1 capsule (100 mg total) by mouth at bedtime.  Marland Kitchen LEVEMIR 100 UNIT/ML injection Inject 74 Units into the skin.   . metoprolol tartrate (LOPRESSOR) 50 MG tablet Take 75 mg by mouth 2 (two) times daily.   . naproxen sodium (ALEVE) 220 MG tablet Take 220 mg by mouth daily.  Marland Kitchen NOVOLOG FLEXPEN 100 UNIT/ML FlexPen  inject 10 units SUBCUTANEOUSLY WITH MAIN MEAL  . potassium chloride SA (K-DUR,KLOR-CON) 20 MEQ tablet Take 20 mEq by mouth daily.   . rosuvastatin (CRESTOR) 20 MG tablet   . sertraline (ZOLOFT) 100 MG tablet   . silver nitrate applicators 03-47 % applicator Apply to wound every other day then cover with guaze and papertape  . [DISCONTINUED] amoxicillin-clavulanate (AUGMENTIN) 500-125 MG tablet Take 1 tablet (500 mg total) by mouth 2 (two) times daily.  . [DISCONTINUED] ciprofloxacin (CIPRO) 500 MG tablet Take 1 tablet (500 mg total) by mouth 2 (two) times daily.  . [DISCONTINUED] sulfamethoxazole-trimethoprim (BACTRIM) 400-80 MG tablet Take 1 tablet by mouth 2 (two) times daily.      Family History: The patient's family history includes Cancer in her father; Cardiomyopathy in her mother; Diabetes in her mother; Heart disease in her father; Tuberculosis in her mother.   ROS:   Please see the history of present illness.     All other systems reviewed and are negative.   Labs/Other Tests and Data Reviewed:     Recent Labs: 08/30/2018: BUN 27; Creat 1.41; Hemoglobin 14.0; Platelets 173; Potassium 5.0; Sodium 136  Recent Lipid Panel No results found for: CHOL, TRIG, HDL, CHOLHDL, VLDL, LDLCALC, LDLDIRECT    Exam:    Vital Signs:  BP (!) 104/59 (Patient Position: Standing)   Pulse 99   Ht 5\' 5"  (1.651 m)   Wt 136 lb (61.7 kg)   BMI 22.63 kg/m     Wt Readings from Last 3 Encounters:  05/09/19 136 lb (61.7 kg)  01/16/19 135 lb (61.2 kg)  08/30/18 136 lb (61.7 kg)     Well nourished, well developed in no acute distress. Alert awake oriented x3.  We took over the video link her daughter assisting with this conversation.  Denies having a chest pain tightness squeezing pressure burning chest at the moment of my interview.  She is quite cheerful.  She described to have some swelling of lower extremities in the evening time she also showed me some red spot on her knee.  Look not  infected.  Diagnosis for this visit:   1. Chronic atrial fibrillation ([place])   2. Essential hypertension   3. Pacemaker   4. History of CVA (cerebrovascular accident)      ASSESSMENT & PLAN:    1.  Chronic atrial fibrillation, not anticoagulated because of frequent falls rate controlled continue present management. 2.  Essential hypertension blood pressure well controlled continue present management. 3.  Pacemaker present.  Noted continue present management.  Normal function 4.  Dizziness I think dizziness is related to in her ear.  I asked her to talk to her primary care physician regarding that issue.  She may require some maneuvers to train her.  Also physical therapy can be beneficial. 5.  She tells me  last blood test check showed significant hyperkalemia.  She will come here on Thursday she get Chem-7 done as well as proBNP and hemoglobin A1c.  COVID-19 Education: The signs and symptoms of COVID-19 were discussed with the patient and how to seek care for testing (follow up with PCP or arrange E-visit).  The importance of social distancing was discussed today.  Patient Risk:   After full review of this patients clinical status, I feel that they are at least moderate risk at this time.  Time:   Today, I have spent 5 minutes with the patient with telehealth technology discussing pt health issues.  I spent 20 minutes reviewing her chart before the visit.  Visit was finished at 9:31 AM.    Medication Adjustments/Labs and Tests Ordered: Current medicines are reviewed at length with the patient today.  Concerns regarding medicines are outlined above.  No orders of the defined types were placed in this encounter.  Medication changes: No orders of the defined types were placed in this encounter.    Disposition: Follow-up in 2 months  Signed, Park Liter, MD, [facility] 05/09/2019 9:29 AM    [place]

## 2019-05-09 NOTE — Addendum Note (Signed)
Addended by: Amado Coe on: 05/09/2019 09:37 AM   Modules accepted: Orders

## 2019-05-09 NOTE — Patient Instructions (Signed)
Medication Instructions:  Your physician recommends that you continue on your current medications as directed. Please refer to the Current Medication list given to you today.  *If you need a refill on your cardiac medications before your next appointment, please call your pharmacy*  Lab Work: Your physician recommends that you return for lab work today: pro bnp, bmp, tsh, and hemoglobin a1c  If you have labs (blood work) drawn today and your tests are completely normal, you will receive your results only by: Marland Kitchen MyChart Message (if you have MyChart) OR . A paper copy in the mail If you have any lab test that is abnormal or we need to change your treatment, we will call you to review the results.  Testing/Procedures: None.    Follow-Up: At [facility], you and your health needs are our priority.  As part of our continuing mission to provide you with exceptional heart care, we have created designated Provider Care Teams.  These Care Teams include your primary Cardiologist (physician) and Advanced Practice Providers (APPs -  Physician Assistants and Nurse Practitioners) who all work together to provide you with the care you need, when you need it.  Your next appointment:   2 month(s)  The format for your next appointment:   In Person  Provider:   Jenne Campus, MD  Other Instructions

## 2019-05-11 DIAGNOSIS — E0865 Diabetes mellitus due to underlying condition with hyperglycemia: Secondary | ICD-10-CM | POA: Diagnosis not present

## 2019-05-11 DIAGNOSIS — N183 Chronic kidney disease, stage 3 unspecified: Secondary | ICD-10-CM | POA: Diagnosis not present

## 2019-05-11 DIAGNOSIS — R42 Dizziness and giddiness: Secondary | ICD-10-CM | POA: Diagnosis not present

## 2019-05-11 DIAGNOSIS — E0822 Diabetes mellitus due to underlying condition with diabetic chronic kidney disease: Secondary | ICD-10-CM | POA: Diagnosis not present

## 2019-05-11 DIAGNOSIS — Z794 Long term (current) use of insulin: Secondary | ICD-10-CM | POA: Diagnosis not present

## 2019-05-11 DIAGNOSIS — I4891 Unspecified atrial fibrillation: Secondary | ICD-10-CM | POA: Diagnosis not present

## 2019-05-12 ENCOUNTER — Ambulatory Visit (INDEPENDENT_AMBULATORY_CARE_PROVIDER_SITE_OTHER): Payer: Medicare Other | Admitting: Sports Medicine

## 2019-05-12 ENCOUNTER — Encounter: Payer: Self-pay | Admitting: Sports Medicine

## 2019-05-12 ENCOUNTER — Other Ambulatory Visit: Payer: Self-pay

## 2019-05-12 DIAGNOSIS — E1142 Type 2 diabetes mellitus with diabetic polyneuropathy: Secondary | ICD-10-CM

## 2019-05-12 DIAGNOSIS — L97522 Non-pressure chronic ulcer of other part of left foot with fat layer exposed: Secondary | ICD-10-CM

## 2019-05-12 DIAGNOSIS — L03031 Cellulitis of right toe: Secondary | ICD-10-CM

## 2019-05-12 DIAGNOSIS — L97512 Non-pressure chronic ulcer of other part of right foot with fat layer exposed: Secondary | ICD-10-CM | POA: Diagnosis not present

## 2019-05-12 DIAGNOSIS — L97511 Non-pressure chronic ulcer of other part of right foot limited to breakdown of skin: Secondary | ICD-10-CM

## 2019-05-12 DIAGNOSIS — R296 Repeated falls: Secondary | ICD-10-CM

## 2019-05-12 DIAGNOSIS — F0150 Vascular dementia without behavioral disturbance: Secondary | ICD-10-CM

## 2019-05-12 MED ORDER — SULFAMETHOXAZOLE-TRIMETHOPRIM 400-80 MG PO TABS: 1.0000 | ORAL_TABLET | Freq: Two times a day (BID) | ORAL | 0 refills | Status: DC

## 2019-05-12 MED ORDER — FLUCONAZOLE 150 MG PO TABS: 150.0000 mg | ORAL_TABLET | Freq: Once | ORAL | 0 refills | Status: AC

## 2019-05-12 NOTE — Progress Notes (Signed)
Subjective: Cindy Hahn is a 85 y.o. female patient seen today in office for follow-up evaluation of bilateral foot ulcers. S/p bilateral I&D with removal of bone and placement of antibiotic beads preformed at [facility] on 01-27-19.  Patient's daughter reports that there was some weird drainage from the right wound late last week.  Denies nausea vomiting fever chills or any other constitutional symptoms at this time.  Fasting blood sugar 178  Patient Active Problem List   Diagnosis Date Noted  . Osteomyelitis ([place]) 10/17/2018  . Diabetic foot ulcer ([place]) 05/05/2018  . History of CVA (cerebrovascular accident) 05/05/2018  . Dementia ([place]) 05/05/2018  . Diabetic polyneuropathy associated with type 2 diabetes mellitus ([place]) 06/04/2015  . Pacemaker 03/11/2015  . Bradycardia 02/06/2015  . Chronic atrial fibrillation ([place]) 11/01/2014  . Essential hypertension 11/01/2014  . Diabetes mellitus ([place]) 11/01/2014    Current Outpatient Medications on File Prior to Visit  Medication Sig Dispense Refill  . ALPRAZolam (XANAX) 1 MG tablet Take 1 mg by mouth.    Marland Kitchen aspirin EC 81 MG tablet Take 81 mg by mouth.    . BD INSULIN SYRINGE U/F 31G X 5/16" 1 ML MISC     . digoxin (LANOXIN) 0.125 MG tablet Take 0.0625 mg by mouth daily.   0  . diltiazem (CARDIZEM) 90 MG tablet Take 1 tablet (90 mg total) by mouth 2 (two) times daily. 60 tablet 11  . gabapentin (NEURONTIN) 100 MG capsule Take 1 capsule (100 mg total) by mouth at bedtime. 30 capsule 3  . LEVEMIR 100 UNIT/ML injection Inject 74 Units into the skin.     . metoprolol tartrate (LOPRESSOR) 50 MG tablet Take 75 mg by mouth 2 (two) times daily.   0  . naproxen sodium (ALEVE) 220 MG tablet Take 220 mg by mouth daily.    Marland Kitchen NOVOLOG FLEXPEN 100 UNIT/ML FlexPen inject 10 units SUBCUTANEOUSLY WITH MAIN MEAL  0  . potassium chloride SA (K-DUR,KLOR-CON) 20 MEQ tablet Take 20 mEq by mouth daily.   0  . rosuvastatin (CRESTOR) 20 MG tablet   0  .  sertraline (ZOLOFT) 100 MG tablet     . silver nitrate applicators 62-13 % applicator Apply to wound every other day then cover with guaze and papertape 100 each 0   No current facility-administered medications on file prior to visit.    Allergies  Allergen Reactions  . Levofloxacin Other (See Comments)    confusion    Objective: There were no vitals filed for this visit.  General: No acute distress, AAOx3  To the right foot there is a partial-thickness ulcer present sub-met 1 that measures 0.6x 0.8 x0.3 cm post debridement with a granular base with mild swelling. To right 2nd toe there is a full thickness ulcer at distal tuft that measures 0.5x0.2x0.2cm with granular base and macerated margins and significant cellulitis to level of MTPJ of the 2nd toe on right.   To the left heel full thickness ulcer that measures 0.65mwidth x 0.5cm in length x 0.5cm probes to soft tissue in range granular in nature. No acute signs of infection.   There is mild pain at ulcerations. Neurovascular unchanged from prior.  Assessment and Plan:  Problem List Items Addressed This Visit      Endocrine   Diabetic polyneuropathy associated with type 2 diabetes mellitus ([place]     Nervous and Auditory   Dementia ([place]    Other Visit Diagnoses    Right foot ulcer, limited  to breakdown of skin ([place])    -  Primary   Relevant Orders   WOUND CULTURE   Foot ulcer, right, with fat layer exposed ([place])       Relevant Orders   WOUND CULTURE   Foot ulcer with fat layer exposed, left ([place])       Frequent falls       Cellulitis of right toe          -Patient seen and evaluated - Excisionally dedbrided ulcerations at right sub met 1, 2nd toe and left heel to healthy bleeding borders removing nonviable tissue using a sterile chisel blade. Wound measures post debridement as above. Wound was debrided to the level of the dermis with viable wound base exposed to promote healing. Hemostasis was achieved with manuel  pressure. Patient tolerated procedure well without any major discomfort or anesthesia necessary for this wound debridement.  -Applied Colactive Plus and dry sterile dressing and instructed patient to continue with daily dressings at home consisting of the same with assistance from home nurse -Rx Bactrim and Diflucan -Wound culture was obtained from R sub met 1 prior to placing dressing, will call if there needs to be a change with antibiotics base on results.  - Advised patient to go to the ER or return to office if the wound worsens or if constitutional symptoms are present. -Encouraged to continue with rest and elevation to assist with wound healing and good supportive shoes like before -Return to office after in 2 weeks for wound care or sooner if problems or issues arise.    Landis Martins, DPM

## 2019-05-15 LAB — WOUND CULTURE

## 2019-05-24 ENCOUNTER — Encounter: Payer: Medicare Other | Admitting: Sports Medicine

## 2019-05-25 ENCOUNTER — Ambulatory Visit (INDEPENDENT_AMBULATORY_CARE_PROVIDER_SITE_OTHER): Payer: Medicare Other | Admitting: Sports Medicine

## 2019-05-25 ENCOUNTER — Other Ambulatory Visit: Payer: Self-pay

## 2019-05-25 DIAGNOSIS — L97522 Non-pressure chronic ulcer of other part of left foot with fat layer exposed: Secondary | ICD-10-CM

## 2019-05-25 DIAGNOSIS — R296 Repeated falls: Secondary | ICD-10-CM

## 2019-05-25 DIAGNOSIS — L97511 Non-pressure chronic ulcer of other part of right foot limited to breakdown of skin: Secondary | ICD-10-CM

## 2019-05-25 DIAGNOSIS — E1142 Type 2 diabetes mellitus with diabetic polyneuropathy: Secondary | ICD-10-CM

## 2019-05-25 DIAGNOSIS — F0150 Vascular dementia without behavioral disturbance: Secondary | ICD-10-CM

## 2019-05-25 DIAGNOSIS — L97512 Non-pressure chronic ulcer of other part of right foot with fat layer exposed: Secondary | ICD-10-CM

## 2019-05-25 NOTE — Progress Notes (Signed)
Subjective: Cindy Hahn is a 85 y.o. female patient seen today in office for follow-up evaluation of bilateral foot ulcers. S/p bilateral I&D with removal of bone and placement of antibiotic beads preformed at [facility] on 01-27-19.  Patient's daughter reports that the 2nd toe is looking better. Patient reports she has some pain to heel and lateral left foot today and has been falling. Patient denies nausea vomiting fever chills or any other constitutional symptoms at this time.  Fasting blood sugar 171  Antibiotics completed.   Patient Active Problem List   Diagnosis Date Noted  . Osteomyelitis ([place]) 10/17/2018  . Diabetic foot ulcer ([place]) 05/05/2018  . History of CVA (cerebrovascular accident) 05/05/2018  . Dementia ([place]) 05/05/2018  . Diabetic polyneuropathy associated with type 2 diabetes mellitus ([place]) 06/04/2015  . Pacemaker 03/11/2015  . Bradycardia 02/06/2015  . Chronic atrial fibrillation ([place]) 11/01/2014  . Essential hypertension 11/01/2014  . Diabetes mellitus ([place]) 11/01/2014    Current Outpatient Medications on File Prior to Visit  Medication Sig Dispense Refill  . ALPRAZolam (XANAX) 1 MG tablet Take 1 mg by mouth.    Marland Kitchen aspirin EC 81 MG tablet Take 81 mg by mouth.    . BD INSULIN SYRINGE U/F 31G X 5/16" 1 ML MISC     . digoxin (LANOXIN) 0.125 MG tablet Take 0.0625 mg by mouth daily.   0  . diltiazem (CARDIZEM) 90 MG tablet Take 1 tablet (90 mg total) by mouth 2 (two) times daily. 60 tablet 11  . fluconazole (DIFLUCAN) 150 MG tablet     . gabapentin (NEURONTIN) 100 MG capsule Take 1 capsule (100 mg total) by mouth at bedtime. 30 capsule 3  . LEVEMIR 100 UNIT/ML injection Inject 74 Units into the skin.     . metoprolol tartrate (LOPRESSOR) 50 MG tablet Take 75 mg by mouth 2 (two) times daily.   0  . naproxen sodium (ALEVE) 220 MG tablet Take 220 mg by mouth daily.    Marland Kitchen NOVOLOG FLEXPEN 100 UNIT/ML FlexPen inject 10 units SUBCUTANEOUSLY WITH MAIN MEAL  0  .  potassium chloride SA (K-DUR,KLOR-CON) 20 MEQ tablet Take 20 mEq by mouth daily.   0  . rosuvastatin (CRESTOR) 20 MG tablet   0  . sertraline (ZOLOFT) 100 MG tablet     . silver nitrate applicators 40-98 % applicator Apply to wound every other day then cover with guaze and papertape 100 each 0  . sulfamethoxazole-trimethoprim (BACTRIM) 400-80 MG tablet Take 1 tablet by mouth 2 (two) times daily. 28 tablet 0   No current facility-administered medications on file prior to visit.    Allergies  Allergen Reactions  . Levofloxacin Other (See Comments)    confusion    Objective: There were no vitals filed for this visit.  General: No acute distress, AAOx3  To the right foot there is a partial-thickness ulcer present sub-met 1 that measures 0.5x 0.6 x0.3 cm post debridement with a granular base with mild swelling. To right 2nd toe there is a full thickness ulcer at distal tuft that measures 0.3x0.2x0.2cm with granular base and decreased cellulitis to level of DIPJ of the 2nd toe on right.   To the left heel full thickness ulcer that measures 0.56mwidth x 0.4cm in length x 0.5cm probes to soft tissue in range granular in nature. No acute signs of infection.   Callus lateral foot on left with no signs of infection.  There is mild pain at ulcerations. Neurovascular unchanged from prior.  Assessment and Plan:  Problem List Items Addressed This Visit      Endocrine   Diabetic polyneuropathy associated with type 2 diabetes mellitus ([place])     Nervous and Auditory   Dementia ([place])    Other Visit Diagnoses    Right foot ulcer, limited to breakdown of skin ([place])    -  Primary   Foot ulcer, right, with fat layer exposed ([place])       Foot ulcer with fat layer exposed, left ([place])       Frequent falls         -Patient seen and evaluated - Excisionally dedbrided ulcerations at right sub met 1, 2nd toe and left heel to healthy bleeding borders removing nonviable tissue using a sterile chisel  blade. Wound measures post debridement as above. Wound was debrided to the level of the dermis with viable wound base exposed to promote healing. Hemostasis was achieved with manuel pressure. Patient tolerated procedure well without any major discomfort or anesthesia necessary for this wound debridement.  -Applied Colactive Plus and dry sterile dressing and instructed patient to continue with daily dressings at home consisting of the same with assistance from home nurse like before - Advised patient to go to the ER or return to office if the wound worsens or if constitutional symptoms are present. -Encouraged to continue with rest and elevation to assist with wound healing and good supportive shoes like before; patient to see Liliane Channel or Benjie Karvonen for new shoes  -Return to office after in 2 weeks for wound care or sooner if problems or issues arise.    Landis Martins, DPM

## 2019-06-08 ENCOUNTER — Ambulatory Visit: Payer: Medicare Other | Admitting: Sports Medicine

## 2019-06-16 ENCOUNTER — Encounter: Payer: Self-pay | Admitting: Sports Medicine

## 2019-06-16 ENCOUNTER — Other Ambulatory Visit: Payer: Self-pay

## 2019-06-16 ENCOUNTER — Ambulatory Visit (INDEPENDENT_AMBULATORY_CARE_PROVIDER_SITE_OTHER): Payer: Medicare Other | Admitting: Sports Medicine

## 2019-06-16 DIAGNOSIS — L03119 Cellulitis of unspecified part of limb: Secondary | ICD-10-CM

## 2019-06-16 DIAGNOSIS — S86011A Strain of right Achilles tendon, initial encounter: Secondary | ICD-10-CM

## 2019-06-16 DIAGNOSIS — R296 Repeated falls: Secondary | ICD-10-CM

## 2019-06-16 DIAGNOSIS — L97512 Non-pressure chronic ulcer of other part of right foot with fat layer exposed: Secondary | ICD-10-CM

## 2019-06-16 DIAGNOSIS — L97511 Non-pressure chronic ulcer of other part of right foot limited to breakdown of skin: Secondary | ICD-10-CM

## 2019-06-16 DIAGNOSIS — L97522 Non-pressure chronic ulcer of other part of left foot with fat layer exposed: Secondary | ICD-10-CM

## 2019-06-16 DIAGNOSIS — F0150 Vascular dementia without behavioral disturbance: Secondary | ICD-10-CM

## 2019-06-16 DIAGNOSIS — E1142 Type 2 diabetes mellitus with diabetic polyneuropathy: Secondary | ICD-10-CM

## 2019-06-16 DIAGNOSIS — L02619 Cutaneous abscess of unspecified foot: Secondary | ICD-10-CM

## 2019-06-16 MED ORDER — SULFAMETHOXAZOLE-TRIMETHOPRIM 400-80 MG PO TABS: 1.0000 | ORAL_TABLET | Freq: Two times a day (BID) | ORAL | 0 refills | Status: DC

## 2019-06-16 MED ORDER — TRAMADOL HCL 50 MG PO TABS: 50.0000 mg | ORAL_TABLET | Freq: Three times a day (TID) | ORAL | 0 refills | Status: AC | PRN

## 2019-06-16 NOTE — Progress Notes (Signed)
Subjective: Cindy Hahn is a 85 y.o. female patient seen today in office for follow-up evaluation of bilateral foot ulcers. S/p bilateral I&D with removal of bone and placement of antibiotic beads preformed at [facility] on 01-27-19.  Patient's daughter reports that the colactive is making the heel look mush and has had increased pain and redness in swelling in the back of her heel on right for the last 2 days, denies injury but has a fallen but is unsure if she has hurt the foot. Patient denies nausea vomiting fever chills or any other constitutional symptoms at this time.  Fasting blood sugar not checked for today   Antibiotics completed.   Patient Active Problem List   Diagnosis Date Noted  . Osteomyelitis ([place]) 10/17/2018  . Diabetic foot ulcer ([place]) 05/05/2018  . History of CVA (cerebrovascular accident) 05/05/2018  . Dementia ([place]) 05/05/2018  . Diabetic polyneuropathy associated with type 2 diabetes mellitus ([place]) 06/04/2015  . Pacemaker 03/11/2015  . Bradycardia 02/06/2015  . Chronic atrial fibrillation ([place]) 11/01/2014  . Essential hypertension 11/01/2014  . Diabetes mellitus ([place]) 11/01/2014    Current Outpatient Medications on File Prior to Visit  Medication Sig Dispense Refill  . ALPRAZolam (XANAX) 1 MG tablet Take 1 mg by mouth.    Marland Kitchen aspirin EC 81 MG tablet Take 81 mg by mouth.    . BD INSULIN SYRINGE U/F 31G X 5/16" 1 ML MISC     . digoxin (LANOXIN) 0.125 MG tablet Take 0.0625 mg by mouth daily.   0  . diltiazem (CARDIZEM) 90 MG tablet Take 1 tablet (90 mg total) by mouth 2 (two) times daily. 60 tablet 11  . fluconazole (DIFLUCAN) 150 MG tablet     . gabapentin (NEURONTIN) 100 MG capsule Take 1 capsule (100 mg total) by mouth at bedtime. 30 capsule 3  . LEVEMIR 100 UNIT/ML injection Inject 74 Units into the skin.     . metoprolol tartrate (LOPRESSOR) 50 MG tablet Take 75 mg by mouth 2 (two) times daily.   0  . naproxen sodium (ALEVE) 220 MG tablet Take 220 mg  by mouth daily.    Marland Kitchen NOVOLOG FLEXPEN 100 UNIT/ML FlexPen inject 10 units SUBCUTANEOUSLY WITH MAIN MEAL  0  . potassium chloride SA (K-DUR,KLOR-CON) 20 MEQ tablet Take 20 mEq by mouth daily.   0  . rosuvastatin (CRESTOR) 20 MG tablet   0  . sertraline (ZOLOFT) 100 MG tablet     . silver nitrate applicators 22-63 % applicator Apply to wound every other day then cover with guaze and papertape 100 each 0   No current facility-administered medications on file prior to visit.    Allergies  Allergen Reactions  . Levofloxacin Other (See Comments)    confusion    Objective: There were no vitals filed for this visit.  General: No acute distress, AAOx3  To the right foot there is a partial-thickness ulcer present sub-met 1 that measures 0.5x 0.6 x0.3 cm post debridement with a granular base with mild swelling. To right 2nd toe there is a full thickness ulcer at distal tuft that measures 0.3x0.2x0.2cm with granular base and decreased cellulitis to level of DIPJ of the 2nd toe on right. There is severe pain redness, swelling, warmth, at right heel at achilles with extreme pain concerning for a tear.    To the left heel full thickness ulcer that measures 0.60mwidth x 0.4cm in length x 0.5cm probes to soft tissue in range granular in nature. To  left sub met 5/lateral there is a partial thickness 0.3x0.2x0.1cm with granular bases with keratotic border. No acute signs of infection.    There is mild pain at ulcerations and severe pain at right heel and achilles. Neurovascular unchanged from prior.  Assessment and Plan:  Problem List Items Addressed This Visit      Endocrine   Diabetic polyneuropathy associated with type 2 diabetes mellitus ([place])     Nervous and Auditory   Dementia ([place])    Other Visit Diagnoses    Achilles tendon tear, right, initial encounter    -  Primary   Right foot ulcer, limited to breakdown of skin ([place])       Foot ulcer, right, with fat layer exposed ([place])       Foot  ulcer with fat layer exposed, left ([place])       Frequent falls       Cellulitis and abscess of foot, except toes         -Patient seen and evaluated - Excisionally dedbrided ulcerations at right sub met 1, 2nd toe and left heel and lateral left foot to healthy bleeding borders removing nonviable tissue using a sterile chisel blade. Wound measures post debridement as above. Wound was debrided to the level of the dermis with viable wound base exposed to promote healing. Hemostasis was achieved with manuel pressure. Patient tolerated procedure well without any major discomfort or anesthesia necessary for this wound debridement.  -Applied Colactive Plus and dry sterile dressing and instructed patient to continue with daily dressings at home consisting of the same with assistance from home nurse like before and compression with ace/unna boot to knee on right -Rx Ultrasound r/o tear at achilles on Right, Urgent -Continue with rest, ice, elevation -Rx Tramadol -Rx Bactrim - Advised patient to go to the ER or return to office if the wound worsens or if constitutional symptoms are present.  -Return to office after in 2 weeks for wound care or sooner if problems or issues arise.    Landis Martins, DPM

## 2019-06-19 ENCOUNTER — Telehealth: Payer: Self-pay | Admitting: *Deleted

## 2019-06-19 DIAGNOSIS — N179 Acute kidney failure, unspecified: Secondary | ICD-10-CM | POA: Diagnosis not present

## 2019-06-19 DIAGNOSIS — M1990 Unspecified osteoarthritis, unspecified site: Secondary | ICD-10-CM | POA: Diagnosis present

## 2019-06-19 DIAGNOSIS — N1831 Chronic kidney disease, stage 3a: Secondary | ICD-10-CM | POA: Diagnosis present

## 2019-06-19 DIAGNOSIS — E161 Other hypoglycemia: Secondary | ICD-10-CM | POA: Diagnosis not present

## 2019-06-19 DIAGNOSIS — I959 Hypotension, unspecified: Secondary | ICD-10-CM | POA: Diagnosis not present

## 2019-06-19 DIAGNOSIS — Z950 Presence of cardiac pacemaker: Secondary | ICD-10-CM | POA: Diagnosis not present

## 2019-06-19 DIAGNOSIS — G9341 Metabolic encephalopathy: Secondary | ICD-10-CM | POA: Diagnosis not present

## 2019-06-19 DIAGNOSIS — Z8673 Personal history of transient ischemic attack (TIA), and cerebral infarction without residual deficits: Secondary | ICD-10-CM | POA: Diagnosis not present

## 2019-06-19 DIAGNOSIS — Z9181 History of falling: Secondary | ICD-10-CM | POA: Diagnosis not present

## 2019-06-19 DIAGNOSIS — E11649 Type 2 diabetes mellitus with hypoglycemia without coma: Secondary | ICD-10-CM | POA: Diagnosis not present

## 2019-06-19 DIAGNOSIS — G4700 Insomnia, unspecified: Secondary | ICD-10-CM | POA: Diagnosis not present

## 2019-06-19 DIAGNOSIS — E162 Hypoglycemia, unspecified: Secondary | ICD-10-CM | POA: Diagnosis not present

## 2019-06-19 DIAGNOSIS — R5381 Other malaise: Secondary | ICD-10-CM | POA: Diagnosis present

## 2019-06-19 DIAGNOSIS — E1122 Type 2 diabetes mellitus with diabetic chronic kidney disease: Secondary | ICD-10-CM | POA: Diagnosis present

## 2019-06-19 DIAGNOSIS — F339 Major depressive disorder, recurrent, unspecified: Secondary | ICD-10-CM | POA: Diagnosis not present

## 2019-06-19 DIAGNOSIS — R079 Chest pain, unspecified: Secondary | ICD-10-CM | POA: Diagnosis not present

## 2019-06-19 DIAGNOSIS — L03115 Cellulitis of right lower limb: Secondary | ICD-10-CM | POA: Diagnosis not present

## 2019-06-19 DIAGNOSIS — N183 Chronic kidney disease, stage 3 unspecified: Secondary | ICD-10-CM | POA: Diagnosis not present

## 2019-06-19 DIAGNOSIS — R0902 Hypoxemia: Secondary | ICD-10-CM | POA: Diagnosis not present

## 2019-06-19 DIAGNOSIS — E860 Dehydration: Secondary | ICD-10-CM | POA: Diagnosis not present

## 2019-06-19 DIAGNOSIS — L0390 Cellulitis, unspecified: Secondary | ICD-10-CM | POA: Diagnosis not present

## 2019-06-19 DIAGNOSIS — E785 Hyperlipidemia, unspecified: Secondary | ICD-10-CM | POA: Diagnosis not present

## 2019-06-19 DIAGNOSIS — I10 Essential (primary) hypertension: Secondary | ICD-10-CM | POA: Diagnosis not present

## 2019-06-19 DIAGNOSIS — Z7982 Long term (current) use of aspirin: Secondary | ICD-10-CM | POA: Diagnosis not present

## 2019-06-19 DIAGNOSIS — I469 Cardiac arrest, cause unspecified: Secondary | ICD-10-CM | POA: Diagnosis not present

## 2019-06-19 DIAGNOSIS — E119 Type 2 diabetes mellitus without complications: Secondary | ICD-10-CM | POA: Diagnosis not present

## 2019-06-19 DIAGNOSIS — R4182 Altered mental status, unspecified: Secondary | ICD-10-CM | POA: Diagnosis not present

## 2019-06-19 DIAGNOSIS — F329 Major depressive disorder, single episode, unspecified: Secondary | ICD-10-CM | POA: Diagnosis present

## 2019-06-19 DIAGNOSIS — I129 Hypertensive chronic kidney disease with stage 1 through stage 4 chronic kidney disease, or unspecified chronic kidney disease: Secondary | ICD-10-CM | POA: Diagnosis present

## 2019-06-19 DIAGNOSIS — S86011A Strain of right Achilles tendon, initial encounter: Secondary | ICD-10-CM

## 2019-06-19 DIAGNOSIS — I482 Chronic atrial fibrillation, unspecified: Secondary | ICD-10-CM | POA: Diagnosis present

## 2019-06-19 DIAGNOSIS — M7989 Other specified soft tissue disorders: Secondary | ICD-10-CM | POA: Diagnosis not present

## 2019-06-19 DIAGNOSIS — F0390 Unspecified dementia without behavioral disturbance: Secondary | ICD-10-CM | POA: Diagnosis present

## 2019-06-19 DIAGNOSIS — L97511 Non-pressure chronic ulcer of other part of right foot limited to breakdown of skin: Secondary | ICD-10-CM

## 2019-06-19 DIAGNOSIS — E872 Acidosis: Secondary | ICD-10-CM | POA: Diagnosis present

## 2019-06-19 DIAGNOSIS — I6930 Unspecified sequelae of cerebral infarction: Secondary | ICD-10-CM | POA: Diagnosis not present

## 2019-06-19 DIAGNOSIS — I4891 Unspecified atrial fibrillation: Secondary | ICD-10-CM | POA: Diagnosis not present

## 2019-06-19 DIAGNOSIS — Z932 Ileostomy status: Secondary | ICD-10-CM | POA: Diagnosis not present

## 2019-06-19 DIAGNOSIS — Z79899 Other long term (current) drug therapy: Secondary | ICD-10-CM | POA: Diagnosis not present

## 2019-06-19 DIAGNOSIS — Z794 Long term (current) use of insulin: Secondary | ICD-10-CM | POA: Diagnosis not present

## 2019-06-19 NOTE — Telephone Encounter (Signed)
Faxed orders to [facility]. I spoke with pt's dtr, Cecille Rubin and informed of the change of location and the [facility] [phone] to schedule.

## 2019-06-19 NOTE — Telephone Encounter (Signed)
[facility] states the radiology technicians state they do not perform achilles tendon [place] any longer, will need to schedule with [facility].

## 2019-06-19 NOTE — Telephone Encounter (Signed)
-----   Message from Landis Martins, [place] sent at 06/16/2019 10:40 PM EST ----- Regarding: [facility] Ultrasoun R heel ASAP Eval for achilles tear on right heel

## 2019-06-19 NOTE — Telephone Encounter (Signed)
Left message on pt's home and mobile phone with appt at [facility].

## 2019-06-19 NOTE — Telephone Encounter (Signed)
[facility] scheduled [place] right foot [phone] for today arrive 1:30pm imaging 2:00pm. Faxed to [facility].

## 2019-06-20 ENCOUNTER — Other Ambulatory Visit: Payer: Medicare Other

## 2019-06-20 DIAGNOSIS — G9341 Metabolic encephalopathy: Secondary | ICD-10-CM | POA: Diagnosis not present

## 2019-06-20 DIAGNOSIS — L03115 Cellulitis of right lower limb: Secondary | ICD-10-CM

## 2019-06-20 DIAGNOSIS — E860 Dehydration: Secondary | ICD-10-CM | POA: Diagnosis not present

## 2019-06-20 DIAGNOSIS — E162 Hypoglycemia, unspecified: Secondary | ICD-10-CM | POA: Diagnosis not present

## 2019-06-21 DIAGNOSIS — G9341 Metabolic encephalopathy: Secondary | ICD-10-CM | POA: Diagnosis not present

## 2019-06-21 DIAGNOSIS — E860 Dehydration: Secondary | ICD-10-CM | POA: Diagnosis not present

## 2019-06-21 DIAGNOSIS — E162 Hypoglycemia, unspecified: Secondary | ICD-10-CM | POA: Diagnosis not present

## 2019-06-22 DIAGNOSIS — E860 Dehydration: Secondary | ICD-10-CM | POA: Diagnosis not present

## 2019-06-22 DIAGNOSIS — G9341 Metabolic encephalopathy: Secondary | ICD-10-CM | POA: Diagnosis not present

## 2019-06-22 DIAGNOSIS — E162 Hypoglycemia, unspecified: Secondary | ICD-10-CM | POA: Diagnosis not present

## 2019-06-23 DIAGNOSIS — G9341 Metabolic encephalopathy: Secondary | ICD-10-CM | POA: Diagnosis not present

## 2019-06-23 DIAGNOSIS — E860 Dehydration: Secondary | ICD-10-CM | POA: Diagnosis not present

## 2019-06-23 DIAGNOSIS — E162 Hypoglycemia, unspecified: Secondary | ICD-10-CM | POA: Diagnosis not present

## 2019-06-24 DIAGNOSIS — I10 Essential (primary) hypertension: Secondary | ICD-10-CM | POA: Diagnosis not present

## 2019-06-24 DIAGNOSIS — M79604 Pain in right leg: Secondary | ICD-10-CM | POA: Diagnosis not present

## 2019-06-24 DIAGNOSIS — N1831 Chronic kidney disease, stage 3a: Secondary | ICD-10-CM | POA: Diagnosis present

## 2019-06-24 DIAGNOSIS — I129 Hypertensive chronic kidney disease with stage 1 through stage 4 chronic kidney disease, or unspecified chronic kidney disease: Secondary | ICD-10-CM | POA: Diagnosis present

## 2019-06-24 DIAGNOSIS — Z794 Long term (current) use of insulin: Secondary | ICD-10-CM | POA: Diagnosis not present

## 2019-06-24 DIAGNOSIS — F339 Major depressive disorder, recurrent, unspecified: Secondary | ICD-10-CM | POA: Diagnosis not present

## 2019-06-24 DIAGNOSIS — G629 Polyneuropathy, unspecified: Secondary | ICD-10-CM | POA: Diagnosis present

## 2019-06-24 DIAGNOSIS — E860 Dehydration: Secondary | ICD-10-CM | POA: Diagnosis not present

## 2019-06-24 DIAGNOSIS — I482 Chronic atrial fibrillation, unspecified: Secondary | ICD-10-CM | POA: Diagnosis not present

## 2019-06-24 DIAGNOSIS — L97412 Non-pressure chronic ulcer of right heel and midfoot with fat layer exposed: Secondary | ICD-10-CM | POA: Diagnosis not present

## 2019-06-24 DIAGNOSIS — E162 Hypoglycemia, unspecified: Secondary | ICD-10-CM | POA: Diagnosis not present

## 2019-06-24 DIAGNOSIS — G4700 Insomnia, unspecified: Secondary | ICD-10-CM | POA: Diagnosis not present

## 2019-06-24 DIAGNOSIS — Z8719 Personal history of other diseases of the digestive system: Secondary | ICD-10-CM | POA: Diagnosis not present

## 2019-06-24 DIAGNOSIS — L0390 Cellulitis, unspecified: Secondary | ICD-10-CM | POA: Diagnosis not present

## 2019-06-24 DIAGNOSIS — R1310 Dysphagia, unspecified: Secondary | ICD-10-CM | POA: Diagnosis present

## 2019-06-24 DIAGNOSIS — N183 Chronic kidney disease, stage 3 unspecified: Secondary | ICD-10-CM | POA: Diagnosis not present

## 2019-06-24 DIAGNOSIS — I4891 Unspecified atrial fibrillation: Secondary | ICD-10-CM | POA: Diagnosis not present

## 2019-06-24 DIAGNOSIS — Z23 Encounter for immunization: Secondary | ICD-10-CM | POA: Diagnosis not present

## 2019-06-24 DIAGNOSIS — E1151 Type 2 diabetes mellitus with diabetic peripheral angiopathy without gangrene: Secondary | ICD-10-CM | POA: Diagnosis not present

## 2019-06-24 DIAGNOSIS — Z950 Presence of cardiac pacemaker: Secondary | ICD-10-CM | POA: Diagnosis not present

## 2019-06-24 DIAGNOSIS — E1122 Type 2 diabetes mellitus with diabetic chronic kidney disease: Secondary | ICD-10-CM | POA: Diagnosis present

## 2019-06-24 DIAGNOSIS — R262 Difficulty in walking, not elsewhere classified: Secondary | ICD-10-CM | POA: Diagnosis not present

## 2019-06-24 DIAGNOSIS — G9341 Metabolic encephalopathy: Secondary | ICD-10-CM | POA: Diagnosis not present

## 2019-06-24 DIAGNOSIS — L02419 Cutaneous abscess of limb, unspecified: Secondary | ICD-10-CM | POA: Diagnosis not present

## 2019-06-24 DIAGNOSIS — Z7982 Long term (current) use of aspirin: Secondary | ICD-10-CM | POA: Diagnosis not present

## 2019-06-24 DIAGNOSIS — M1990 Unspecified osteoarthritis, unspecified site: Secondary | ICD-10-CM | POA: Diagnosis present

## 2019-06-24 DIAGNOSIS — F0390 Unspecified dementia without behavioral disturbance: Secondary | ICD-10-CM | POA: Diagnosis present

## 2019-06-24 DIAGNOSIS — M25471 Effusion, right ankle: Secondary | ICD-10-CM | POA: Diagnosis not present

## 2019-06-24 DIAGNOSIS — Z932 Ileostomy status: Secondary | ICD-10-CM | POA: Diagnosis not present

## 2019-06-24 DIAGNOSIS — L97529 Non-pressure chronic ulcer of other part of left foot with unspecified severity: Secondary | ICD-10-CM | POA: Diagnosis present

## 2019-06-24 DIAGNOSIS — N179 Acute kidney failure, unspecified: Secondary | ICD-10-CM | POA: Diagnosis present

## 2019-06-24 DIAGNOSIS — B9689 Other specified bacterial agents as the cause of diseases classified elsewhere: Secondary | ICD-10-CM | POA: Diagnosis not present

## 2019-06-24 DIAGNOSIS — Z853 Personal history of malignant neoplasm of breast: Secondary | ICD-10-CM | POA: Diagnosis not present

## 2019-06-24 DIAGNOSIS — Z8673 Personal history of transient ischemic attack (TIA), and cerebral infarction without residual deficits: Secondary | ICD-10-CM | POA: Diagnosis not present

## 2019-06-24 DIAGNOSIS — E119 Type 2 diabetes mellitus without complications: Secondary | ICD-10-CM | POA: Diagnosis not present

## 2019-06-24 DIAGNOSIS — E785 Hyperlipidemia, unspecified: Secondary | ICD-10-CM | POA: Diagnosis present

## 2019-06-24 DIAGNOSIS — Z79899 Other long term (current) drug therapy: Secondary | ICD-10-CM | POA: Diagnosis not present

## 2019-06-24 DIAGNOSIS — R000 Tachycardia, unspecified: Secondary | ICD-10-CM | POA: Diagnosis not present

## 2019-06-24 DIAGNOSIS — R079 Chest pain, unspecified: Secondary | ICD-10-CM | POA: Diagnosis present

## 2019-06-24 DIAGNOSIS — F419 Anxiety disorder, unspecified: Secondary | ICD-10-CM | POA: Diagnosis present

## 2019-06-24 DIAGNOSIS — F418 Other specified anxiety disorders: Secondary | ICD-10-CM | POA: Diagnosis present

## 2019-06-24 DIAGNOSIS — L03115 Cellulitis of right lower limb: Secondary | ICD-10-CM | POA: Diagnosis not present

## 2019-06-24 DIAGNOSIS — L02415 Cutaneous abscess of right lower limb: Secondary | ICD-10-CM | POA: Diagnosis not present

## 2019-06-24 DIAGNOSIS — I6930 Unspecified sequelae of cerebral infarction: Secondary | ICD-10-CM | POA: Diagnosis not present

## 2019-06-26 ENCOUNTER — Telehealth: Payer: Self-pay

## 2019-06-26 DIAGNOSIS — R262 Difficulty in walking, not elsewhere classified: Secondary | ICD-10-CM | POA: Diagnosis not present

## 2019-06-26 DIAGNOSIS — I4891 Unspecified atrial fibrillation: Secondary | ICD-10-CM | POA: Diagnosis not present

## 2019-06-26 DIAGNOSIS — L03115 Cellulitis of right lower limb: Secondary | ICD-10-CM | POA: Diagnosis not present

## 2019-06-26 DIAGNOSIS — E1151 Type 2 diabetes mellitus with diabetic peripheral angiopathy without gangrene: Secondary | ICD-10-CM | POA: Diagnosis not present

## 2019-06-26 NOTE — Telephone Encounter (Signed)
For right ankle if there is no opening or wound she does not need wound care but wound recommend ice to area 10mins 2-3x per day and applying an ace wrap or soft cast/unna boot toe help with pain and swelling at posterior heel. Also make sure when patient is sitting to elevate heel off of bed with pillows  Thanks Dr. Cannon Kettle

## 2019-06-26 NOTE — Telephone Encounter (Signed)
Jessica-Nurse called requesting orders for treatment for ankle. Please advice

## 2019-06-27 DIAGNOSIS — L97412 Non-pressure chronic ulcer of right heel and midfoot with fat layer exposed: Secondary | ICD-10-CM | POA: Diagnosis not present

## 2019-06-27 NOTE — Telephone Encounter (Signed)
Since it is open I recommend iodosorb or a silver alignate dressing that can absorb the drainage and offloading of the heel Thanks Dr. Cannon Kettle

## 2019-06-27 NOTE — Telephone Encounter (Signed)
Called and spoke with Nira Conn from [facility] and gave instructions from Dr. Cannon Kettle on ankle care. They stated understanding but stated the wound it is open and if they need any ointment or bandage to apply since it's draining a lot

## 2019-06-27 NOTE — Telephone Encounter (Signed)
Called [facility] and spoke with Nira Conn about Dr Leeanne Rio wound care instructions. Nira Conn stated understanding. Nira Conn also stated the wound care Dr. Clarnce Flock the pt this morning and suggested to get an MRI on the pt since it looks like osteomyelitis and drainage not looking good."

## 2019-06-28 ENCOUNTER — Telehealth: Payer: Self-pay | Admitting: Sports Medicine

## 2019-06-28 ENCOUNTER — Telehealth: Payer: Self-pay | Admitting: Urology

## 2019-06-28 NOTE — Telephone Encounter (Signed)
Return daughter's phone call.  Patient's daughter had questions about if there was a wound present at the last visit our office doctors made rounds with her mom when she was in the hospital and according to documentation on Tuesday and Thursday there was no wound present to the right heel however now at [facility] they are reporting an open wound to right heel, I reassured daughter that wounds can change secondary to pressure and that the nurses from [facility] have called me and I have recommended wound care and offloading instructions.  Patient will be followed while in-house by their wound care nurse as well as I encouraged the staff to also consider ordering a CT scan if the wound continues to progress to rule out any deeper soft tissue infection.  Patient to follow-up after she is discharged from rehab for continued wound care.  Patient expressed thanks for me returning her phone call in answering any questions that she had. -Dr. Cannon Kettle

## 2019-06-28 NOTE — Telephone Encounter (Signed)
Thanks

## 2019-06-28 NOTE — Telephone Encounter (Signed)
Cecille Rubin (Daughter) called and said pt is at [facility] she cxled her appt for Friday and stated she will call back to reschedule when pt is dc'd form [facility].

## 2019-06-29 NOTE — Telephone Encounter (Signed)
LVM to Heather from [facility] to return my phone call to review Dr. Leeanne Rio instructions.

## 2019-06-30 ENCOUNTER — Ambulatory Visit: Payer: Medicare Other | Admitting: Sports Medicine

## 2019-07-04 DIAGNOSIS — L97412 Non-pressure chronic ulcer of right heel and midfoot with fat layer exposed: Secondary | ICD-10-CM | POA: Diagnosis not present

## 2019-07-06 DIAGNOSIS — R079 Chest pain, unspecified: Secondary | ICD-10-CM | POA: Diagnosis present

## 2019-07-06 DIAGNOSIS — L97529 Non-pressure chronic ulcer of other part of left foot with unspecified severity: Secondary | ICD-10-CM | POA: Diagnosis present

## 2019-07-06 DIAGNOSIS — I129 Hypertensive chronic kidney disease with stage 1 through stage 4 chronic kidney disease, or unspecified chronic kidney disease: Secondary | ICD-10-CM | POA: Diagnosis not present

## 2019-07-06 DIAGNOSIS — F419 Anxiety disorder, unspecified: Secondary | ICD-10-CM | POA: Diagnosis present

## 2019-07-06 DIAGNOSIS — Z743 Need for continuous supervision: Secondary | ICD-10-CM | POA: Diagnosis not present

## 2019-07-06 DIAGNOSIS — L02419 Cutaneous abscess of limb, unspecified: Secondary | ICD-10-CM | POA: Diagnosis not present

## 2019-07-06 DIAGNOSIS — F0390 Unspecified dementia without behavioral disturbance: Secondary | ICD-10-CM | POA: Diagnosis present

## 2019-07-06 DIAGNOSIS — I4891 Unspecified atrial fibrillation: Secondary | ICD-10-CM | POA: Diagnosis not present

## 2019-07-06 DIAGNOSIS — E1122 Type 2 diabetes mellitus with diabetic chronic kidney disease: Secondary | ICD-10-CM | POA: Diagnosis present

## 2019-07-06 DIAGNOSIS — I482 Chronic atrial fibrillation, unspecified: Secondary | ICD-10-CM | POA: Diagnosis not present

## 2019-07-06 DIAGNOSIS — Z7982 Long term (current) use of aspirin: Secondary | ICD-10-CM | POA: Diagnosis not present

## 2019-07-06 DIAGNOSIS — F339 Major depressive disorder, recurrent, unspecified: Secondary | ICD-10-CM | POA: Diagnosis not present

## 2019-07-06 DIAGNOSIS — F418 Other specified anxiety disorders: Secondary | ICD-10-CM | POA: Diagnosis present

## 2019-07-06 DIAGNOSIS — N179 Acute kidney failure, unspecified: Secondary | ICD-10-CM | POA: Diagnosis present

## 2019-07-06 DIAGNOSIS — N1831 Chronic kidney disease, stage 3a: Secondary | ICD-10-CM | POA: Diagnosis present

## 2019-07-06 DIAGNOSIS — L03115 Cellulitis of right lower limb: Secondary | ICD-10-CM | POA: Diagnosis not present

## 2019-07-06 DIAGNOSIS — E119 Type 2 diabetes mellitus without complications: Secondary | ICD-10-CM | POA: Diagnosis not present

## 2019-07-06 DIAGNOSIS — I6930 Unspecified sequelae of cerebral infarction: Secondary | ICD-10-CM | POA: Diagnosis not present

## 2019-07-06 DIAGNOSIS — R000 Tachycardia, unspecified: Secondary | ICD-10-CM | POA: Diagnosis not present

## 2019-07-06 DIAGNOSIS — R5381 Other malaise: Secondary | ICD-10-CM | POA: Diagnosis not present

## 2019-07-06 DIAGNOSIS — I10 Essential (primary) hypertension: Secondary | ICD-10-CM | POA: Diagnosis not present

## 2019-07-06 DIAGNOSIS — Z8673 Personal history of transient ischemic attack (TIA), and cerebral infarction without residual deficits: Secondary | ICD-10-CM | POA: Diagnosis not present

## 2019-07-06 DIAGNOSIS — R1310 Dysphagia, unspecified: Secondary | ICD-10-CM | POA: Diagnosis present

## 2019-07-06 DIAGNOSIS — Z794 Long term (current) use of insulin: Secondary | ICD-10-CM | POA: Diagnosis not present

## 2019-07-06 DIAGNOSIS — N183 Chronic kidney disease, stage 3 unspecified: Secondary | ICD-10-CM | POA: Diagnosis not present

## 2019-07-06 DIAGNOSIS — L0390 Cellulitis, unspecified: Secondary | ICD-10-CM | POA: Diagnosis not present

## 2019-07-06 DIAGNOSIS — M25471 Effusion, right ankle: Secondary | ICD-10-CM | POA: Diagnosis not present

## 2019-07-06 DIAGNOSIS — E785 Hyperlipidemia, unspecified: Secondary | ICD-10-CM | POA: Diagnosis not present

## 2019-07-06 DIAGNOSIS — R279 Unspecified lack of coordination: Secondary | ICD-10-CM | POA: Diagnosis not present

## 2019-07-06 DIAGNOSIS — Z23 Encounter for immunization: Secondary | ICD-10-CM | POA: Diagnosis not present

## 2019-07-06 DIAGNOSIS — Z853 Personal history of malignant neoplasm of breast: Secondary | ICD-10-CM | POA: Diagnosis not present

## 2019-07-06 DIAGNOSIS — M1990 Unspecified osteoarthritis, unspecified site: Secondary | ICD-10-CM | POA: Diagnosis present

## 2019-07-06 DIAGNOSIS — G629 Polyneuropathy, unspecified: Secondary | ICD-10-CM | POA: Diagnosis present

## 2019-07-06 DIAGNOSIS — M79604 Pain in right leg: Secondary | ICD-10-CM | POA: Diagnosis not present

## 2019-07-06 DIAGNOSIS — Z950 Presence of cardiac pacemaker: Secondary | ICD-10-CM | POA: Diagnosis not present

## 2019-07-06 DIAGNOSIS — Z79899 Other long term (current) drug therapy: Secondary | ICD-10-CM | POA: Diagnosis not present

## 2019-07-06 DIAGNOSIS — Z932 Ileostomy status: Secondary | ICD-10-CM | POA: Diagnosis not present

## 2019-07-06 DIAGNOSIS — L02412 Cutaneous abscess of left axilla: Secondary | ICD-10-CM | POA: Diagnosis not present

## 2019-07-06 DIAGNOSIS — Z8719 Personal history of other diseases of the digestive system: Secondary | ICD-10-CM | POA: Diagnosis not present

## 2019-07-06 DIAGNOSIS — L02415 Cutaneous abscess of right lower limb: Secondary | ICD-10-CM | POA: Diagnosis not present

## 2019-07-06 DIAGNOSIS — G4700 Insomnia, unspecified: Secondary | ICD-10-CM | POA: Diagnosis not present

## 2019-07-06 DIAGNOSIS — B9689 Other specified bacterial agents as the cause of diseases classified elsewhere: Secondary | ICD-10-CM | POA: Diagnosis not present

## 2019-07-07 DIAGNOSIS — N183 Chronic kidney disease, stage 3 unspecified: Secondary | ICD-10-CM | POA: Diagnosis not present

## 2019-07-07 DIAGNOSIS — M79604 Pain in right leg: Secondary | ICD-10-CM

## 2019-07-07 DIAGNOSIS — I4891 Unspecified atrial fibrillation: Secondary | ICD-10-CM | POA: Diagnosis not present

## 2019-07-07 DIAGNOSIS — E119 Type 2 diabetes mellitus without complications: Secondary | ICD-10-CM | POA: Diagnosis not present

## 2019-07-07 DIAGNOSIS — L02419 Cutaneous abscess of limb, unspecified: Secondary | ICD-10-CM

## 2019-07-08 DIAGNOSIS — N183 Chronic kidney disease, stage 3 unspecified: Secondary | ICD-10-CM | POA: Diagnosis not present

## 2019-07-08 DIAGNOSIS — L02419 Cutaneous abscess of limb, unspecified: Secondary | ICD-10-CM | POA: Diagnosis not present

## 2019-07-08 DIAGNOSIS — I4891 Unspecified atrial fibrillation: Secondary | ICD-10-CM | POA: Diagnosis not present

## 2019-07-08 DIAGNOSIS — E119 Type 2 diabetes mellitus without complications: Secondary | ICD-10-CM | POA: Diagnosis not present

## 2019-07-09 DIAGNOSIS — L02419 Cutaneous abscess of limb, unspecified: Secondary | ICD-10-CM | POA: Diagnosis not present

## 2019-07-09 DIAGNOSIS — N183 Chronic kidney disease, stage 3 unspecified: Secondary | ICD-10-CM | POA: Diagnosis not present

## 2019-07-09 DIAGNOSIS — I4891 Unspecified atrial fibrillation: Secondary | ICD-10-CM | POA: Diagnosis not present

## 2019-07-09 DIAGNOSIS — E119 Type 2 diabetes mellitus without complications: Secondary | ICD-10-CM | POA: Diagnosis not present

## 2019-07-10 DIAGNOSIS — L02419 Cutaneous abscess of limb, unspecified: Secondary | ICD-10-CM | POA: Diagnosis not present

## 2019-07-10 DIAGNOSIS — I4891 Unspecified atrial fibrillation: Secondary | ICD-10-CM | POA: Diagnosis not present

## 2019-07-10 DIAGNOSIS — E119 Type 2 diabetes mellitus without complications: Secondary | ICD-10-CM | POA: Diagnosis not present

## 2019-07-10 DIAGNOSIS — N183 Chronic kidney disease, stage 3 unspecified: Secondary | ICD-10-CM | POA: Diagnosis not present

## 2019-07-11 DIAGNOSIS — F419 Anxiety disorder, unspecified: Secondary | ICD-10-CM | POA: Diagnosis not present

## 2019-07-11 DIAGNOSIS — G4700 Insomnia, unspecified: Secondary | ICD-10-CM | POA: Diagnosis not present

## 2019-07-11 DIAGNOSIS — Z794 Long term (current) use of insulin: Secondary | ICD-10-CM | POA: Diagnosis not present

## 2019-07-11 DIAGNOSIS — N1831 Chronic kidney disease, stage 3a: Secondary | ICD-10-CM | POA: Diagnosis not present

## 2019-07-11 DIAGNOSIS — I4891 Unspecified atrial fibrillation: Secondary | ICD-10-CM | POA: Diagnosis not present

## 2019-07-11 DIAGNOSIS — N183 Chronic kidney disease, stage 3 unspecified: Secondary | ICD-10-CM | POA: Diagnosis not present

## 2019-07-11 DIAGNOSIS — R5381 Other malaise: Secondary | ICD-10-CM | POA: Diagnosis not present

## 2019-07-11 DIAGNOSIS — G8918 Other acute postprocedural pain: Secondary | ICD-10-CM | POA: Diagnosis not present

## 2019-07-11 DIAGNOSIS — I129 Hypertensive chronic kidney disease with stage 1 through stage 4 chronic kidney disease, or unspecified chronic kidney disease: Secondary | ICD-10-CM | POA: Diagnosis not present

## 2019-07-11 DIAGNOSIS — I10 Essential (primary) hypertension: Secondary | ICD-10-CM | POA: Diagnosis not present

## 2019-07-11 DIAGNOSIS — F0390 Unspecified dementia without behavioral disturbance: Secondary | ICD-10-CM | POA: Diagnosis not present

## 2019-07-11 DIAGNOSIS — Z23 Encounter for immunization: Secondary | ICD-10-CM | POA: Diagnosis not present

## 2019-07-11 DIAGNOSIS — H35371 Puckering of macula, right eye: Secondary | ICD-10-CM | POA: Diagnosis not present

## 2019-07-11 DIAGNOSIS — L0390 Cellulitis, unspecified: Secondary | ICD-10-CM | POA: Diagnosis not present

## 2019-07-11 DIAGNOSIS — D649 Anemia, unspecified: Secondary | ICD-10-CM | POA: Diagnosis not present

## 2019-07-11 DIAGNOSIS — Z79899 Other long term (current) drug therapy: Secondary | ICD-10-CM | POA: Diagnosis not present

## 2019-07-11 DIAGNOSIS — I482 Chronic atrial fibrillation, unspecified: Secondary | ICD-10-CM | POA: Diagnosis not present

## 2019-07-11 DIAGNOSIS — M1990 Unspecified osteoarthritis, unspecified site: Secondary | ICD-10-CM | POA: Diagnosis not present

## 2019-07-11 DIAGNOSIS — F339 Major depressive disorder, recurrent, unspecified: Secondary | ICD-10-CM | POA: Diagnosis not present

## 2019-07-11 DIAGNOSIS — Z7982 Long term (current) use of aspirin: Secondary | ICD-10-CM | POA: Diagnosis not present

## 2019-07-11 DIAGNOSIS — Z853 Personal history of malignant neoplasm of breast: Secondary | ICD-10-CM | POA: Diagnosis not present

## 2019-07-11 DIAGNOSIS — Z8673 Personal history of transient ischemic attack (TIA), and cerebral infarction without residual deficits: Secondary | ICD-10-CM | POA: Diagnosis not present

## 2019-07-11 DIAGNOSIS — G629 Polyneuropathy, unspecified: Secondary | ICD-10-CM | POA: Diagnosis not present

## 2019-07-11 DIAGNOSIS — E119 Type 2 diabetes mellitus without complications: Secondary | ICD-10-CM | POA: Diagnosis not present

## 2019-07-11 DIAGNOSIS — Z743 Need for continuous supervision: Secondary | ICD-10-CM | POA: Diagnosis not present

## 2019-07-11 DIAGNOSIS — L02419 Cutaneous abscess of limb, unspecified: Secondary | ICD-10-CM | POA: Diagnosis not present

## 2019-07-11 DIAGNOSIS — R1310 Dysphagia, unspecified: Secondary | ICD-10-CM | POA: Diagnosis not present

## 2019-07-11 DIAGNOSIS — I6930 Unspecified sequelae of cerebral infarction: Secondary | ICD-10-CM | POA: Diagnosis not present

## 2019-07-11 DIAGNOSIS — Z932 Ileostomy status: Secondary | ICD-10-CM | POA: Diagnosis not present

## 2019-07-11 DIAGNOSIS — R079 Chest pain, unspecified: Secondary | ICD-10-CM | POA: Diagnosis not present

## 2019-07-11 DIAGNOSIS — L97529 Non-pressure chronic ulcer of other part of left foot with unspecified severity: Secondary | ICD-10-CM | POA: Diagnosis not present

## 2019-07-11 DIAGNOSIS — E1122 Type 2 diabetes mellitus with diabetic chronic kidney disease: Secondary | ICD-10-CM | POA: Diagnosis not present

## 2019-07-11 DIAGNOSIS — Z8719 Personal history of other diseases of the digestive system: Secondary | ICD-10-CM | POA: Diagnosis not present

## 2019-07-11 DIAGNOSIS — F418 Other specified anxiety disorders: Secondary | ICD-10-CM | POA: Diagnosis not present

## 2019-07-11 DIAGNOSIS — N179 Acute kidney failure, unspecified: Secondary | ICD-10-CM | POA: Diagnosis not present

## 2019-07-11 DIAGNOSIS — R279 Unspecified lack of coordination: Secondary | ICD-10-CM | POA: Diagnosis not present

## 2019-07-11 DIAGNOSIS — E785 Hyperlipidemia, unspecified: Secondary | ICD-10-CM | POA: Diagnosis not present

## 2019-07-11 DIAGNOSIS — Z950 Presence of cardiac pacemaker: Secondary | ICD-10-CM | POA: Diagnosis not present

## 2019-07-11 DIAGNOSIS — E113412 Type 2 diabetes mellitus with severe nonproliferative diabetic retinopathy with macular edema, left eye: Secondary | ICD-10-CM | POA: Diagnosis not present

## 2019-07-15 DIAGNOSIS — N1831 Chronic kidney disease, stage 3a: Secondary | ICD-10-CM | POA: Diagnosis not present

## 2019-07-15 DIAGNOSIS — L02419 Cutaneous abscess of limb, unspecified: Secondary | ICD-10-CM | POA: Diagnosis not present

## 2019-07-15 DIAGNOSIS — G8918 Other acute postprocedural pain: Secondary | ICD-10-CM | POA: Diagnosis not present

## 2019-07-15 DIAGNOSIS — D649 Anemia, unspecified: Secondary | ICD-10-CM | POA: Diagnosis not present

## 2019-07-19 ENCOUNTER — Other Ambulatory Visit: Payer: Self-pay | Admitting: *Deleted

## 2019-07-19 NOTE — Patient Outreach (Signed)
Screened for potential [facility] Care Management needs as a benefit of  [employer].  Mrs. Peraza is currently receiving skilled therapy at [facility].  Writer attended telephonic interdisciplinary team meeting to assess for disposition needs and transition plan for resident.   Facility reports member is from home with daughter. However daughter works during the day. Facility states they have had a care plan meeting with family and will plan on another meeting next Tuesday.   Will plan outreach to family as appropriate to discuss transition plans and potential [facility] Care Management services. Verified with [facility] that member is out of their service area.    Marthenia Rolling, MSN-Ed, RN,BSN [place] Acute Care Coordinator [phone] [facility]) [phone]  (Toll free office)

## 2019-07-26 ENCOUNTER — Other Ambulatory Visit: Payer: Self-pay | Admitting: *Deleted

## 2019-07-26 DIAGNOSIS — I10 Essential (primary) hypertension: Secondary | ICD-10-CM

## 2019-07-26 NOTE — Patient Outreach (Signed)
Screened for potential [facility] Care Management needs as a benefit of  [employer].  Cindy Hahn is currently receiving skilled therapy at [facility].  Writer attended telephonic interdisciplinary team meeting to assess for disposition needs and transition plan for resident.   Facility reports member will dc home tomorrow 07/27/19 per family request. States family was advised that Cindy Hahn will require 24/7. Member lives with son and daughter. Daughter works during the day and son works at night but will likely be asleep during day.   Facility SW states daughter Cindy Hahn is primary contact. Facility SW indicates member will have [facility].  Telephone call made to Cindy Hahn (daughter) at [phone]. No answer. HIPAA compliant voicemail message left. Called [phone] was told it was the wrong number for [facility]. Left HIPAA compliant voicemail message on member's home phone.   Per [facility] UM documentation, per facility, member has history of HTN, pacer, CKD III, CVA, DM, ulcerative colitis.   Will make [facility] Management referral for care coordination since member discharges from [facility] on tomorrow 07/27/19.  Cindy Rolling, MSN-Ed, RN,BSN [place] Acute Care Coordinator [phone] [facility]) [phone]  (Toll free office)

## 2019-07-27 ENCOUNTER — Telehealth: Payer: Self-pay | Admitting: Urology

## 2019-07-27 DIAGNOSIS — E113412 Type 2 diabetes mellitus with severe nonproliferative diabetic retinopathy with macular edema, left eye: Secondary | ICD-10-CM | POA: Diagnosis not present

## 2019-07-27 DIAGNOSIS — H35371 Puckering of macula, right eye: Secondary | ICD-10-CM | POA: Diagnosis not present

## 2019-07-27 NOTE — Telephone Encounter (Signed)
Faxed copy of Dr. Cannon Kettle 07/27/2019 1:19pm orders to [facility].

## 2019-07-27 NOTE — Telephone Encounter (Signed)
I spoke with Janett Billow from [facility] who informed me that patient was discharged home on yesterday with home nursing with [facility].  Jessica sent me a photo of the surgical site and I informed her that I will send orders for home health to "apply santyl and dry dressing to the fibrotic wound site at her right leg incision at least 3x per week covered with dry dressing".  Val please send the above orders.  Thanks Dr. Cannon Kettle

## 2019-07-27 NOTE — Telephone Encounter (Signed)
Cindy Hahn from [facility] called wanting to let you know around the sx site where the opening was on her right leg the staples are not connected with the skin, they are still intact, they are just separating and she does have some yellow necrotic tissue at the sx area and she would like a call from you do discus pt.  # [phone] ext  [phone].

## 2019-07-28 ENCOUNTER — Other Ambulatory Visit: Payer: Self-pay

## 2019-07-28 NOTE — Patient Outreach (Signed)
New referral: Discharged from [facility] on 07/27/2019 Dx: DM foot ulcers   Reviewed medical record.  Placed call to patients home, daughter home and daughters mobile. No answer and no machine to leave a message.   PLAN: will mail an unsuccessful outreach letter and call back in 3 days.  Tomasa Rand, RN, BSN, CEN [facility] [employer] [phone]

## 2019-07-30 DIAGNOSIS — Z8673 Personal history of transient ischemic attack (TIA), and cerebral infarction without residual deficits: Secondary | ICD-10-CM | POA: Diagnosis not present

## 2019-07-30 DIAGNOSIS — D631 Anemia in chronic kidney disease: Secondary | ICD-10-CM | POA: Diagnosis not present

## 2019-07-30 DIAGNOSIS — R54 Age-related physical debility: Secondary | ICD-10-CM | POA: Diagnosis not present

## 2019-07-30 DIAGNOSIS — N183 Chronic kidney disease, stage 3 unspecified: Secondary | ICD-10-CM | POA: Diagnosis not present

## 2019-07-30 DIAGNOSIS — R2689 Other abnormalities of gait and mobility: Secondary | ICD-10-CM | POA: Diagnosis not present

## 2019-07-30 DIAGNOSIS — I129 Hypertensive chronic kidney disease with stage 1 through stage 4 chronic kidney disease, or unspecified chronic kidney disease: Secondary | ICD-10-CM | POA: Diagnosis not present

## 2019-07-30 DIAGNOSIS — I739 Peripheral vascular disease, unspecified: Secondary | ICD-10-CM | POA: Diagnosis not present

## 2019-07-30 DIAGNOSIS — I482 Chronic atrial fibrillation, unspecified: Secondary | ICD-10-CM | POA: Diagnosis not present

## 2019-07-30 DIAGNOSIS — F418 Other specified anxiety disorders: Secondary | ICD-10-CM | POA: Diagnosis not present

## 2019-07-30 DIAGNOSIS — R5381 Other malaise: Secondary | ICD-10-CM | POA: Diagnosis not present

## 2019-07-30 DIAGNOSIS — E46 Unspecified protein-calorie malnutrition: Secondary | ICD-10-CM | POA: Diagnosis not present

## 2019-07-30 DIAGNOSIS — X58XXXD Exposure to other specified factors, subsequent encounter: Secondary | ICD-10-CM | POA: Diagnosis not present

## 2019-07-30 DIAGNOSIS — Z79899 Other long term (current) drug therapy: Secondary | ICD-10-CM | POA: Diagnosis not present

## 2019-07-30 DIAGNOSIS — M1990 Unspecified osteoarthritis, unspecified site: Secondary | ICD-10-CM | POA: Diagnosis not present

## 2019-07-30 DIAGNOSIS — F0390 Unspecified dementia without behavioral disturbance: Secondary | ICD-10-CM | POA: Diagnosis not present

## 2019-07-30 DIAGNOSIS — Z20828 Contact with and (suspected) exposure to other viral communicable diseases: Secondary | ICD-10-CM | POA: Diagnosis not present

## 2019-07-30 DIAGNOSIS — Z794 Long term (current) use of insulin: Secondary | ICD-10-CM | POA: Diagnosis not present

## 2019-07-30 DIAGNOSIS — Z853 Personal history of malignant neoplasm of breast: Secondary | ICD-10-CM | POA: Diagnosis not present

## 2019-07-30 DIAGNOSIS — Z932 Ileostomy status: Secondary | ICD-10-CM | POA: Diagnosis not present

## 2019-07-30 DIAGNOSIS — Z7982 Long term (current) use of aspirin: Secondary | ICD-10-CM | POA: Diagnosis not present

## 2019-07-31 ENCOUNTER — Telehealth: Payer: Self-pay

## 2019-07-31 NOTE — Telephone Encounter (Signed)
Tillie Rung from [facility] called stating they had a referral for OP therapy and nursing PT for Pt. Tillie Rung states OP will start out Sunday or Monday, but daughter declined nursing/wound care and would do the wound care herself

## 2019-07-31 NOTE — Telephone Encounter (Signed)
Ok sounds good  thanks

## 2019-08-01 DIAGNOSIS — I482 Chronic atrial fibrillation, unspecified: Secondary | ICD-10-CM | POA: Diagnosis not present

## 2019-08-01 DIAGNOSIS — I129 Hypertensive chronic kidney disease with stage 1 through stage 4 chronic kidney disease, or unspecified chronic kidney disease: Secondary | ICD-10-CM | POA: Diagnosis not present

## 2019-08-01 DIAGNOSIS — E46 Unspecified protein-calorie malnutrition: Secondary | ICD-10-CM | POA: Diagnosis not present

## 2019-08-01 DIAGNOSIS — F0390 Unspecified dementia without behavioral disturbance: Secondary | ICD-10-CM | POA: Diagnosis not present

## 2019-08-01 DIAGNOSIS — N183 Chronic kidney disease, stage 3 unspecified: Secondary | ICD-10-CM | POA: Diagnosis not present

## 2019-08-01 DIAGNOSIS — F418 Other specified anxiety disorders: Secondary | ICD-10-CM | POA: Diagnosis not present

## 2019-08-02 ENCOUNTER — Other Ambulatory Visit: Payer: Self-pay

## 2019-08-02 NOTE — Patient Outreach (Signed)
[facility]) Care Management  08/02/2019  Cindy Hahn [birth year] [id number]   Telephone assessment:  Placed call to patient for second outreach attempt. No answer  PLAN: will attempt again in 3 days. Letter already mailed.  Tomasa Rand, RN, BSN, CEN [facility] [employer] [phone]

## 2019-08-03 DIAGNOSIS — F0390 Unspecified dementia without behavioral disturbance: Secondary | ICD-10-CM | POA: Diagnosis not present

## 2019-08-03 DIAGNOSIS — I129 Hypertensive chronic kidney disease with stage 1 through stage 4 chronic kidney disease, or unspecified chronic kidney disease: Secondary | ICD-10-CM | POA: Diagnosis not present

## 2019-08-03 DIAGNOSIS — I482 Chronic atrial fibrillation, unspecified: Secondary | ICD-10-CM | POA: Diagnosis not present

## 2019-08-03 DIAGNOSIS — E46 Unspecified protein-calorie malnutrition: Secondary | ICD-10-CM | POA: Diagnosis not present

## 2019-08-03 DIAGNOSIS — F418 Other specified anxiety disorders: Secondary | ICD-10-CM | POA: Diagnosis not present

## 2019-08-03 DIAGNOSIS — N183 Chronic kidney disease, stage 3 unspecified: Secondary | ICD-10-CM | POA: Diagnosis not present

## 2019-08-07 ENCOUNTER — Other Ambulatory Visit: Payer: Self-pay

## 2019-08-07 DIAGNOSIS — F418 Other specified anxiety disorders: Secondary | ICD-10-CM | POA: Diagnosis not present

## 2019-08-07 DIAGNOSIS — F0390 Unspecified dementia without behavioral disturbance: Secondary | ICD-10-CM | POA: Diagnosis not present

## 2019-08-07 DIAGNOSIS — I129 Hypertensive chronic kidney disease with stage 1 through stage 4 chronic kidney disease, or unspecified chronic kidney disease: Secondary | ICD-10-CM | POA: Diagnosis not present

## 2019-08-07 DIAGNOSIS — E46 Unspecified protein-calorie malnutrition: Secondary | ICD-10-CM | POA: Diagnosis not present

## 2019-08-07 DIAGNOSIS — N183 Chronic kidney disease, stage 3 unspecified: Secondary | ICD-10-CM | POA: Diagnosis not present

## 2019-08-07 DIAGNOSIS — I482 Chronic atrial fibrillation, unspecified: Secondary | ICD-10-CM | POA: Diagnosis not present

## 2019-08-07 NOTE — Patient Outreach (Signed)
[facility]) Care Management  08/07/2019  [name] 01-30-1935 [id number]   Outreach attempt to patients home number-no answer Outreach attempt to paitents mobile number with successful. It was daughter Baird Lyons cell phone number. I explained how I was trying to reach her mother and the reason for the call. Daughter states she is a Marine scientist and has everything covered. Declined services. I reviewed with daughter that I mailed an outreach letter to her mothers home and if she or her mother change their mind to please call me.  Will plan case closure as daughter denied any needs.  Tomasa Rand, RN, BSN, CEN [facility] [employer] [phone]

## 2019-08-08 DIAGNOSIS — E1165 Type 2 diabetes mellitus with hyperglycemia: Secondary | ICD-10-CM | POA: Diagnosis not present

## 2019-08-08 DIAGNOSIS — E1122 Type 2 diabetes mellitus with diabetic chronic kidney disease: Secondary | ICD-10-CM | POA: Diagnosis not present

## 2019-08-08 DIAGNOSIS — I70203 Unspecified atherosclerosis of native arteries of extremities, bilateral legs: Secondary | ICD-10-CM | POA: Diagnosis not present

## 2019-08-08 DIAGNOSIS — B372 Candidiasis of skin and nail: Secondary | ICD-10-CM | POA: Diagnosis not present

## 2019-08-10 ENCOUNTER — Encounter: Payer: Self-pay | Admitting: Sports Medicine

## 2019-08-10 ENCOUNTER — Ambulatory Visit (INDEPENDENT_AMBULATORY_CARE_PROVIDER_SITE_OTHER): Payer: Medicare Other | Admitting: Sports Medicine

## 2019-08-10 ENCOUNTER — Other Ambulatory Visit: Payer: Self-pay

## 2019-08-10 DIAGNOSIS — E1142 Type 2 diabetes mellitus with diabetic polyneuropathy: Secondary | ICD-10-CM

## 2019-08-10 DIAGNOSIS — Z9889 Other specified postprocedural states: Secondary | ICD-10-CM

## 2019-08-10 DIAGNOSIS — L84 Corns and callosities: Secondary | ICD-10-CM

## 2019-08-10 DIAGNOSIS — F0150 Vascular dementia without behavioral disturbance: Secondary | ICD-10-CM

## 2019-08-10 DIAGNOSIS — T8131XA Disruption of external operation (surgical) wound, not elsewhere classified, initial encounter: Secondary | ICD-10-CM

## 2019-08-10 NOTE — Progress Notes (Signed)
Subjective: Cindy Hahn is a 85 y.o. female patient seen today in office for POV #1 (DOS 06/2019) , S/P R Achilles debridement and I&D, Patient admits pain at surgical site, daughter is hepling to change dressing. Reports yeast infection took Diflucan. Denies nausea, vomiting, fever, chills. No other issues noted.   Patient Active Problem List   Diagnosis Date Noted  . Osteomyelitis ([place]) 10/17/2018  . Diabetic foot ulcer ([place]) 05/05/2018  . History of CVA (cerebrovascular accident) 05/05/2018  . Dementia ([place]) 05/05/2018  . Diabetic polyneuropathy associated with type 2 diabetes mellitus ([place]) 06/04/2015  . Pacemaker 03/11/2015  . Bradycardia 02/06/2015  . Chronic atrial fibrillation ([place]) 11/01/2014  . Essential hypertension 11/01/2014  . Diabetes mellitus ([place]) 11/01/2014    Current Outpatient Medications on File Prior to Visit  Medication Sig Dispense Refill  . ALPRAZolam (XANAX) 1 MG tablet Take 1 mg by mouth.    Marland Kitchen aspirin EC 81 MG tablet Take 81 mg by mouth.    . BD INSULIN SYRINGE U/F 31G X 5/16" 1 ML MISC     . digoxin (LANOXIN) 0.125 MG tablet Take 0.0625 mg by mouth daily.   0  . diltiazem (CARDIZEM) 90 MG tablet Take 1 tablet (90 mg total) by mouth 2 (two) times daily. 60 tablet 11  . fluconazole (DIFLUCAN) 150 MG tablet     . gabapentin (NEURONTIN) 100 MG capsule Take 1 capsule (100 mg total) by mouth at bedtime. 30 capsule 3  . LEVEMIR 100 UNIT/ML injection Inject 74 Units into the skin.     . metoprolol tartrate (LOPRESSOR) 50 MG tablet Take 75 mg by mouth 2 (two) times daily.   0  . naproxen sodium (ALEVE) 220 MG tablet Take 220 mg by mouth daily.    Marland Kitchen NOVOLOG FLEXPEN 100 UNIT/ML FlexPen inject 10 units SUBCUTANEOUSLY WITH MAIN MEAL  0  . potassium chloride SA (K-DUR,KLOR-CON) 20 MEQ tablet Take 20 mEq by mouth daily.   0  . rosuvastatin (CRESTOR) 20 MG tablet   0  . sertraline (ZOLOFT) 100 MG tablet     . silver nitrate applicators 41-74 % applicator Apply to  wound every other day then cover with guaze and papertape 100 each 0  . sulfamethoxazole-trimethoprim (BACTRIM) 400-80 MG tablet Take 1 tablet by mouth 2 (two) times daily. 28 tablet 0   No current facility-administered medications on file prior to visit.    Allergies  Allergen Reactions  . Levofloxacin Other (See Comments)    confusion    Objective: There were no vitals filed for this visit.  General: No acute distress, AAOx3  Right foot: Staples intact with gapping/ dehiscence at surgical site centrally that measures 4x2x0.4cm with tendon exposure and granular wound edges,  No warmth, mild bloody drainage, mild swelling to right posterior heel/leg, no erythema, no warmth, no other signs of infection noted, Capillary fill time <3 seconds in all digits, gross sensation present via light touch to right foot. + pain to right leg.  No pain with calf compression. + Plantar pre-ulcerative callus bilateral. + digital deformity bilateral.   Assessment and Plan:  Problem List Items Addressed This Visit      Endocrine   Diabetic polyneuropathy associated with type 2 diabetes mellitus ([place])     Nervous and Auditory   Dementia ([place])    Other Visit Diagnoses    Postoperative wound dehiscence, initial encounter    -  Primary   S/P foot surgery, right  Pre-ulcerative calluses           -Patient seen and evaluated -Callus clean and dry bilateral feet  -Several staples removed at right surgical site -Applied Prisma to wound and dry sterile dressing to surgical site on right -Daughter to continue to assist with dressing changes QOD; Wound care supples ordered from [facility] -Advised patient to continue with good supportive shoes  -Advised patient to limit activity to necessity  -Advised patient to ice and elevate as necessary  -Will plan for wound care and finishing suture removal at next office visit. In the meantime, patient to call office if any issues or problems arise.   Landis Martins, DPM

## 2019-08-11 DIAGNOSIS — F418 Other specified anxiety disorders: Secondary | ICD-10-CM | POA: Diagnosis not present

## 2019-08-11 DIAGNOSIS — I482 Chronic atrial fibrillation, unspecified: Secondary | ICD-10-CM | POA: Diagnosis not present

## 2019-08-11 DIAGNOSIS — I129 Hypertensive chronic kidney disease with stage 1 through stage 4 chronic kidney disease, or unspecified chronic kidney disease: Secondary | ICD-10-CM | POA: Diagnosis not present

## 2019-08-11 DIAGNOSIS — F0390 Unspecified dementia without behavioral disturbance: Secondary | ICD-10-CM | POA: Diagnosis not present

## 2019-08-11 DIAGNOSIS — E46 Unspecified protein-calorie malnutrition: Secondary | ICD-10-CM | POA: Diagnosis not present

## 2019-08-11 DIAGNOSIS — N183 Chronic kidney disease, stage 3 unspecified: Secondary | ICD-10-CM | POA: Diagnosis not present

## 2019-08-15 DIAGNOSIS — E46 Unspecified protein-calorie malnutrition: Secondary | ICD-10-CM | POA: Diagnosis not present

## 2019-08-15 DIAGNOSIS — I129 Hypertensive chronic kidney disease with stage 1 through stage 4 chronic kidney disease, or unspecified chronic kidney disease: Secondary | ICD-10-CM | POA: Diagnosis not present

## 2019-08-15 DIAGNOSIS — N183 Chronic kidney disease, stage 3 unspecified: Secondary | ICD-10-CM | POA: Diagnosis not present

## 2019-08-15 DIAGNOSIS — F418 Other specified anxiety disorders: Secondary | ICD-10-CM | POA: Diagnosis not present

## 2019-08-15 DIAGNOSIS — I482 Chronic atrial fibrillation, unspecified: Secondary | ICD-10-CM | POA: Diagnosis not present

## 2019-08-15 DIAGNOSIS — F0390 Unspecified dementia without behavioral disturbance: Secondary | ICD-10-CM | POA: Diagnosis not present

## 2019-08-16 DIAGNOSIS — F418 Other specified anxiety disorders: Secondary | ICD-10-CM | POA: Diagnosis not present

## 2019-08-16 DIAGNOSIS — F0390 Unspecified dementia without behavioral disturbance: Secondary | ICD-10-CM | POA: Diagnosis not present

## 2019-08-16 DIAGNOSIS — N183 Chronic kidney disease, stage 3 unspecified: Secondary | ICD-10-CM | POA: Diagnosis not present

## 2019-08-16 DIAGNOSIS — E46 Unspecified protein-calorie malnutrition: Secondary | ICD-10-CM | POA: Diagnosis not present

## 2019-08-16 DIAGNOSIS — I129 Hypertensive chronic kidney disease with stage 1 through stage 4 chronic kidney disease, or unspecified chronic kidney disease: Secondary | ICD-10-CM | POA: Diagnosis not present

## 2019-08-16 DIAGNOSIS — I482 Chronic atrial fibrillation, unspecified: Secondary | ICD-10-CM | POA: Diagnosis not present

## 2019-08-17 ENCOUNTER — Telehealth: Payer: Self-pay

## 2019-08-17 DIAGNOSIS — I129 Hypertensive chronic kidney disease with stage 1 through stage 4 chronic kidney disease, or unspecified chronic kidney disease: Secondary | ICD-10-CM | POA: Diagnosis not present

## 2019-08-17 DIAGNOSIS — I482 Chronic atrial fibrillation, unspecified: Secondary | ICD-10-CM | POA: Diagnosis not present

## 2019-08-17 DIAGNOSIS — E46 Unspecified protein-calorie malnutrition: Secondary | ICD-10-CM | POA: Diagnosis not present

## 2019-08-17 DIAGNOSIS — N183 Chronic kidney disease, stage 3 unspecified: Secondary | ICD-10-CM | POA: Diagnosis not present

## 2019-08-17 DIAGNOSIS — F418 Other specified anxiety disorders: Secondary | ICD-10-CM | POA: Diagnosis not present

## 2019-08-17 DIAGNOSIS — F0390 Unspecified dementia without behavioral disturbance: Secondary | ICD-10-CM | POA: Diagnosis not present

## 2019-08-17 NOTE — Telephone Encounter (Signed)
Yes may start OT 1x for 4 weeks

## 2019-08-17 NOTE — Telephone Encounter (Signed)
Ronalee Belts OT from [facility] called requesting verbal order approval for 1 wk for 4 weeks. Please advice

## 2019-08-18 NOTE — Telephone Encounter (Signed)
Spoke with Ronalee Belts and stated that per Dr. Cannon Kettle she give him approval to do OT 1x/wk for 4 wks

## 2019-08-21 DIAGNOSIS — E46 Unspecified protein-calorie malnutrition: Secondary | ICD-10-CM | POA: Diagnosis not present

## 2019-08-21 DIAGNOSIS — F418 Other specified anxiety disorders: Secondary | ICD-10-CM | POA: Diagnosis not present

## 2019-08-21 DIAGNOSIS — I129 Hypertensive chronic kidney disease with stage 1 through stage 4 chronic kidney disease, or unspecified chronic kidney disease: Secondary | ICD-10-CM | POA: Diagnosis not present

## 2019-08-21 DIAGNOSIS — F0390 Unspecified dementia without behavioral disturbance: Secondary | ICD-10-CM | POA: Diagnosis not present

## 2019-08-21 DIAGNOSIS — N183 Chronic kidney disease, stage 3 unspecified: Secondary | ICD-10-CM | POA: Diagnosis not present

## 2019-08-21 DIAGNOSIS — I482 Chronic atrial fibrillation, unspecified: Secondary | ICD-10-CM | POA: Diagnosis not present

## 2019-08-23 DIAGNOSIS — I482 Chronic atrial fibrillation, unspecified: Secondary | ICD-10-CM | POA: Diagnosis not present

## 2019-08-23 DIAGNOSIS — E46 Unspecified protein-calorie malnutrition: Secondary | ICD-10-CM | POA: Diagnosis not present

## 2019-08-23 DIAGNOSIS — F0390 Unspecified dementia without behavioral disturbance: Secondary | ICD-10-CM | POA: Diagnosis not present

## 2019-08-23 DIAGNOSIS — F418 Other specified anxiety disorders: Secondary | ICD-10-CM | POA: Diagnosis not present

## 2019-08-23 DIAGNOSIS — I129 Hypertensive chronic kidney disease with stage 1 through stage 4 chronic kidney disease, or unspecified chronic kidney disease: Secondary | ICD-10-CM | POA: Diagnosis not present

## 2019-08-23 DIAGNOSIS — N183 Chronic kidney disease, stage 3 unspecified: Secondary | ICD-10-CM | POA: Diagnosis not present

## 2019-08-24 ENCOUNTER — Ambulatory Visit (INDEPENDENT_AMBULATORY_CARE_PROVIDER_SITE_OTHER): Payer: Medicare Other | Admitting: Sports Medicine

## 2019-08-24 ENCOUNTER — Other Ambulatory Visit: Payer: Self-pay

## 2019-08-24 DIAGNOSIS — L97501 Non-pressure chronic ulcer of other part of unspecified foot limited to breakdown of skin: Secondary | ICD-10-CM | POA: Diagnosis not present

## 2019-08-24 DIAGNOSIS — Z9889 Other specified postprocedural states: Secondary | ICD-10-CM

## 2019-08-24 DIAGNOSIS — F0150 Vascular dementia without behavioral disturbance: Secondary | ICD-10-CM | POA: Diagnosis not present

## 2019-08-24 DIAGNOSIS — E1142 Type 2 diabetes mellitus with diabetic polyneuropathy: Secondary | ICD-10-CM

## 2019-08-24 DIAGNOSIS — T8131XD Disruption of external operation (surgical) wound, not elsewhere classified, subsequent encounter: Secondary | ICD-10-CM | POA: Diagnosis not present

## 2019-08-24 DIAGNOSIS — L97912 Non-pressure chronic ulcer of unspecified part of right lower leg with fat layer exposed: Secondary | ICD-10-CM | POA: Diagnosis not present

## 2019-08-24 MED ORDER — FLUCONAZOLE 150 MG PO TABS: 150.0000 mg | ORAL_TABLET | Freq: Once | ORAL | 4 refills | Status: AC

## 2019-08-24 MED ORDER — SULFAMETHOXAZOLE-TRIMETHOPRIM 400-80 MG PO TABS: 1.0000 | ORAL_TABLET | Freq: Two times a day (BID) | ORAL | 0 refills | Status: AC

## 2019-08-24 MED ORDER — SANTYL 250 UNIT/GM EX OINT: 1.0000 "application " | TOPICAL_OINTMENT | Freq: Every day | CUTANEOUS | 0 refills | Status: AC

## 2019-08-25 ENCOUNTER — Encounter: Payer: Self-pay | Admitting: Sports Medicine

## 2019-08-25 DIAGNOSIS — E46 Unspecified protein-calorie malnutrition: Secondary | ICD-10-CM | POA: Diagnosis not present

## 2019-08-25 DIAGNOSIS — I129 Hypertensive chronic kidney disease with stage 1 through stage 4 chronic kidney disease, or unspecified chronic kidney disease: Secondary | ICD-10-CM | POA: Diagnosis not present

## 2019-08-25 DIAGNOSIS — F418 Other specified anxiety disorders: Secondary | ICD-10-CM | POA: Diagnosis not present

## 2019-08-25 DIAGNOSIS — N183 Chronic kidney disease, stage 3 unspecified: Secondary | ICD-10-CM | POA: Diagnosis not present

## 2019-08-25 DIAGNOSIS — I482 Chronic atrial fibrillation, unspecified: Secondary | ICD-10-CM | POA: Diagnosis not present

## 2019-08-25 DIAGNOSIS — F0390 Unspecified dementia without behavioral disturbance: Secondary | ICD-10-CM | POA: Diagnosis not present

## 2019-08-25 NOTE — Progress Notes (Addendum)
Subjective: Cindy Hahn is a 85 y.o. female patient seen today in office for POV # 2 (DOS 06/2019) , S/P R Achilles debridement and I&D, Patient is assisted by daughter who is helping to change dressing on the back of the right leg who reports that there has been a little drainage but has been consistent with applying the Prisma and dry dressing to the area.  She also reports that her mother is trying to get over an infection around her colostomy site. Denies nausea, vomiting, fever, chills. No other issues noted.   Patient Active Problem List   Diagnosis Date Noted  . Osteomyelitis ([place]) 10/17/2018  . Diabetic foot ulcer ([place]) 05/05/2018  . History of CVA (cerebrovascular accident) 05/05/2018  . Dementia ([place]) 05/05/2018  . Diabetic polyneuropathy associated with type 2 diabetes mellitus ([place]) 06/04/2015  . Pacemaker 03/11/2015  . Bradycardia 02/06/2015  . Chronic atrial fibrillation ([place]) 11/01/2014  . Essential hypertension 11/01/2014  . Diabetes mellitus ([place]) 11/01/2014    Current Outpatient Medications on File Prior to Visit  Medication Sig Dispense Refill  . ALPRAZolam (XANAX) 1 MG tablet Take 1 mg by mouth.    Marland Kitchen aspirin EC 81 MG tablet Take 81 mg by mouth.    . BD INSULIN SYRINGE U/F 31G X 5/16" 1 ML MISC     . digoxin (LANOXIN) 0.125 MG tablet Take 0.0625 mg by mouth daily.   0  . diltiazem (CARDIZEM) 90 MG tablet Take 1 tablet (90 mg total) by mouth 2 (two) times daily. 60 tablet 11  . gabapentin (NEURONTIN) 100 MG capsule Take 1 capsule (100 mg total) by mouth at bedtime. 30 capsule 3  . LEVEMIR 100 UNIT/ML injection Inject 74 Units into the skin.     . metoprolol tartrate (LOPRESSOR) 50 MG tablet Take 75 mg by mouth 2 (two) times daily.   0  . naproxen sodium (ALEVE) 220 MG tablet Take 220 mg by mouth daily.    Marland Kitchen NOVOLOG FLEXPEN 100 UNIT/ML FlexPen inject 10 units SUBCUTANEOUSLY WITH MAIN MEAL  0  . potassium chloride SA (K-DUR,KLOR-CON) 20 MEQ tablet Take 20 mEq by  mouth daily.   0  . rosuvastatin (CRESTOR) 20 MG tablet   0  . sertraline (ZOLOFT) 100 MG tablet     . silver nitrate applicators 67-34 % applicator Apply to wound every other day then cover with guaze and papertape 100 each 0   No current facility-administered medications on file prior to visit.    Allergies  Allergen Reactions  . Levofloxacin Other (See Comments)    confusion    Objective: There were no vitals filed for this visit.  General: No acute distress, AAOx3  Right posterior heel/leg, Staples intact at proximal calf incision with gapping/ dehiscence at surgical site centrally that measures 3.5 x2x0.4cm with tendon exposure and granular wound edges,  No warmth, mild bloody drainage, mild swelling to right posterior heel/leg, no erythema, no warmth, no other signs of infection noted, There is a fluctuant blister noted to the plantar aspect of the right foot applying lancing and debridement of this blister there was a partial-thickness ulcer noted measuring 0.5 x 0.8 cm with a granular base does not probe to bone no other signs of infection noted. There is also multiple abrasions to toes bilateral with pinpoint bleeding and wounds noted with no surrounding signs of infection. To the plantar left foot there are wound submet 1 and 5 and central heel that all measure less than 0.5 cm  with granular base is once debrided that once was preulcerative calluses with no surrounding signs of infection.  Capillary fill time <3 seconds in all digits, gross sensation present via light touch bilateral + pain to right leg.  No pain with calf compression.  Assessment and Plan:  Problem List Items Addressed This Visit      Endocrine   Diabetic polyneuropathy associated with type 2 diabetes mellitus ([place])     Nervous and Auditory   Dementia ([place])    Other Visit Diagnoses    Postoperative wound dehiscence, subsequent encounter    -  Primary   Leg ulcer, right, with fat layer exposed ([place])        S/P foot surgery, right       Ulcer of foot, limited to breakdown of skin, unspecified laterality ([place])           -Patient seen and evaluated -All staples were removed at the posterior calf on the right and applied antibiotic cream and Mepilex border to the open wound area at the surgical site and ordered Santyl for the daughter to apply daily - Excisionally dedbrided ulcerations at the left foot x3 at submet 1, submet 5, and central heel, and bilateral toes and right plantar foot submetatarsal 1 to healthy bleeding borders removing nonviable tissue using a sterile chisel blade. Wound measures post debridement as above. Wound was debrided to the level of the dermis with viable wound base exposed to promote healing. Hemostasis was achieved with manuel pressure. Patient tolerated procedure well without any discomfort or anesthesia necessary for this wound debridement.  -Applied antibiotic cream and dry sterile dressing and instructed patient to continue with daily dressings at home consisting of Santyl to all wounds bilateral once they pick up this medication from the pharmacy -Prescribed Bactrim and Diflucan for patient to take for a blister at the plantar aspect of the right foot that ended up being an ulcer since patient has a history of repeat infections.  Advised patient and daughter to go to the ER or return to office if the wounds worsens or if constitutional symptoms are present. -Advised patient to continue with good supportive shoes and to avoid excessive walking which could be adding to her ulcerations because when patient was admitted to collapse and did not walk all of these plantar ulcers had healed -Advised patient to ice and elevate as necessary if needed for any pain on the right posterior leg or calf -Will plan for follow-up wound care bilateral at next office visit. In the meantime, patient to call office if any issues or problems arise.   Landis Martins, DPM  Daughter contacted  me requesting home nursing.  Office will contact [facility] to provide wound care of Santyl to ulcerations bilateral.

## 2019-08-28 ENCOUNTER — Telehealth: Payer: Self-pay | Admitting: *Deleted

## 2019-08-28 NOTE — Telephone Encounter (Signed)
-----   Message from Landis Martins, [place] sent at 08/25/2019  8:41 PM EDT ----- Regarding: [facility] wound care Santyl and dry dressing still ulcerations bilateral at minimum 3 times per week

## 2019-08-28 NOTE — Telephone Encounter (Signed)
FAxed copy of Dr. Leeanne Rio 08/25/2019 8:41pm orders to [facility].

## 2019-08-29 ENCOUNTER — Telehealth: Payer: Self-pay | Admitting: *Deleted

## 2019-08-29 DIAGNOSIS — Z7982 Long term (current) use of aspirin: Secondary | ICD-10-CM | POA: Diagnosis not present

## 2019-08-29 DIAGNOSIS — X58XXXD Exposure to other specified factors, subsequent encounter: Secondary | ICD-10-CM | POA: Diagnosis not present

## 2019-08-29 DIAGNOSIS — I129 Hypertensive chronic kidney disease with stage 1 through stage 4 chronic kidney disease, or unspecified chronic kidney disease: Secondary | ICD-10-CM | POA: Diagnosis not present

## 2019-08-29 DIAGNOSIS — Z79899 Other long term (current) drug therapy: Secondary | ICD-10-CM | POA: Diagnosis not present

## 2019-08-29 DIAGNOSIS — Z932 Ileostomy status: Secondary | ICD-10-CM | POA: Diagnosis not present

## 2019-08-29 DIAGNOSIS — F418 Other specified anxiety disorders: Secondary | ICD-10-CM | POA: Diagnosis not present

## 2019-08-29 DIAGNOSIS — I482 Chronic atrial fibrillation, unspecified: Secondary | ICD-10-CM | POA: Diagnosis not present

## 2019-08-29 DIAGNOSIS — N183 Chronic kidney disease, stage 3 unspecified: Secondary | ICD-10-CM | POA: Diagnosis not present

## 2019-08-29 DIAGNOSIS — R5381 Other malaise: Secondary | ICD-10-CM | POA: Diagnosis not present

## 2019-08-29 DIAGNOSIS — Z853 Personal history of malignant neoplasm of breast: Secondary | ICD-10-CM | POA: Diagnosis not present

## 2019-08-29 DIAGNOSIS — F0390 Unspecified dementia without behavioral disturbance: Secondary | ICD-10-CM | POA: Diagnosis not present

## 2019-08-29 DIAGNOSIS — R2689 Other abnormalities of gait and mobility: Secondary | ICD-10-CM | POA: Diagnosis not present

## 2019-08-29 DIAGNOSIS — M1990 Unspecified osteoarthritis, unspecified site: Secondary | ICD-10-CM | POA: Diagnosis not present

## 2019-08-29 DIAGNOSIS — Z20828 Contact with and (suspected) exposure to other viral communicable diseases: Secondary | ICD-10-CM | POA: Diagnosis not present

## 2019-08-29 DIAGNOSIS — Z8673 Personal history of transient ischemic attack (TIA), and cerebral infarction without residual deficits: Secondary | ICD-10-CM | POA: Diagnosis not present

## 2019-08-29 DIAGNOSIS — I739 Peripheral vascular disease, unspecified: Secondary | ICD-10-CM | POA: Diagnosis not present

## 2019-08-29 DIAGNOSIS — R54 Age-related physical debility: Secondary | ICD-10-CM | POA: Diagnosis not present

## 2019-08-29 DIAGNOSIS — E46 Unspecified protein-calorie malnutrition: Secondary | ICD-10-CM | POA: Diagnosis not present

## 2019-08-29 DIAGNOSIS — Z794 Long term (current) use of insulin: Secondary | ICD-10-CM | POA: Diagnosis not present

## 2019-08-29 DIAGNOSIS — D631 Anemia in chronic kidney disease: Secondary | ICD-10-CM | POA: Diagnosis not present

## 2019-08-29 NOTE — Telephone Encounter (Signed)
Faxed orders to continue Dr. Leeanne Rio prescribed care and skilled ostomy site care.

## 2019-08-29 NOTE — Telephone Encounter (Signed)
-----   Message from Landis Martins, [place] sent at 08/28/2019  3:15 PM EDT ----- Regarding: [place] Daughter is also requesting home health care as well. Send order for [facility] to evaluate patient for home health care needs as well as skilled ostomy site care -Dr. Chauncey Cruel

## 2019-09-01 ENCOUNTER — Telehealth: Payer: Self-pay

## 2019-09-01 NOTE — Telephone Encounter (Signed)
Ok thanks 

## 2019-09-01 NOTE — Telephone Encounter (Signed)
Cindy Hahn stating they saw pt yesterday afternoon for her Rt lower leg wound dressing change, Cindy Hahn states they applied santly on the whole wound with a foam dressing.  Cindy Hahn states santly is to be applied once daily, so they will teach the patient's caregiver on the application of the santly and they will keep monitoring the pt once weekly and give supplies to caregiver.

## 2019-09-08 ENCOUNTER — Other Ambulatory Visit: Payer: Self-pay

## 2019-09-08 ENCOUNTER — Encounter: Payer: Self-pay | Admitting: Sports Medicine

## 2019-09-08 ENCOUNTER — Ambulatory Visit (INDEPENDENT_AMBULATORY_CARE_PROVIDER_SITE_OTHER): Payer: Medicare Other | Admitting: Sports Medicine

## 2019-09-08 DIAGNOSIS — E1142 Type 2 diabetes mellitus with diabetic polyneuropathy: Secondary | ICD-10-CM

## 2019-09-08 DIAGNOSIS — L97501 Non-pressure chronic ulcer of other part of unspecified foot limited to breakdown of skin: Secondary | ICD-10-CM

## 2019-09-08 DIAGNOSIS — Z9889 Other specified postprocedural states: Secondary | ICD-10-CM

## 2019-09-08 DIAGNOSIS — T8131XD Disruption of external operation (surgical) wound, not elsewhere classified, subsequent encounter: Secondary | ICD-10-CM

## 2019-09-08 DIAGNOSIS — F0150 Vascular dementia without behavioral disturbance: Secondary | ICD-10-CM

## 2019-09-08 DIAGNOSIS — L97912 Non-pressure chronic ulcer of unspecified part of right lower leg with fat layer exposed: Secondary | ICD-10-CM

## 2019-09-08 NOTE — Progress Notes (Signed)
Subjective: Cindy Hahn is a 85 y.o. female patient seen today in office for POV # 3 (DOS 06/2019) , S/P R Achilles debridement and I&D, Patient is assisted by daughter who is helping to change dressing on the back of the right leg and home nurse. Reports that everything is doing good, a little drainage from the bottom. No other issues noted.   Patient Active Problem List   Diagnosis Date Noted  . Osteomyelitis ([place]) 10/17/2018  . Diabetic foot ulcer ([place]) 05/05/2018  . History of CVA (cerebrovascular accident) 05/05/2018  . Dementia ([place]) 05/05/2018  . Diabetic polyneuropathy associated with type 2 diabetes mellitus ([place]) 06/04/2015  . Pacemaker 03/11/2015  . Bradycardia 02/06/2015  . Chronic atrial fibrillation ([place]) 11/01/2014  . Essential hypertension 11/01/2014  . Diabetes mellitus ([place]) 11/01/2014    Current Outpatient Medications on File Prior to Visit  Medication Sig Dispense Refill  . ALPRAZolam (XANAX) 1 MG tablet Take 1 mg by mouth.    Marland Kitchen aspirin EC 81 MG tablet Take 81 mg by mouth.    . BD INSULIN SYRINGE U/F 31G X 5/16" 1 ML MISC     . collagenase (SANTYL) ointment Apply 1 application topically daily. 30 g 0  . digoxin (LANOXIN) 0.125 MG tablet Take 0.0625 mg by mouth daily.   0  . diltiazem (CARDIZEM) 90 MG tablet Take 1 tablet (90 mg total) by mouth 2 (two) times daily. 60 tablet 11  . gabapentin (NEURONTIN) 100 MG capsule Take 1 capsule (100 mg total) by mouth at bedtime. 30 capsule 3  . LEVEMIR 100 UNIT/ML injection Inject 74 Units into the skin.     . metoprolol tartrate (LOPRESSOR) 50 MG tablet Take 75 mg by mouth 2 (two) times daily.   0  . naproxen sodium (ALEVE) 220 MG tablet Take 220 mg by mouth daily.    Marland Kitchen NOVOLOG FLEXPEN 100 UNIT/ML FlexPen inject 10 units SUBCUTANEOUSLY WITH MAIN MEAL  0  . potassium chloride SA (K-DUR,KLOR-CON) 20 MEQ tablet Take 20 mEq by mouth daily.   0  . rosuvastatin (CRESTOR) 20 MG tablet   0  . sertraline (ZOLOFT) 100 MG tablet      . silver nitrate applicators 78-58 % applicator Apply to wound every other day then cover with guaze and papertape 100 each 0   No current facility-administered medications on file prior to visit.    Allergies  Allergen Reactions  . Levofloxacin Other (See Comments)    confusion    Objective: There were no vitals filed for this visit.  General: No acute distress, AAOx3  Right posterior heel/leg, Wound/ dehiscence at surgical site centrally that measures 3.0 x2x0.4cm with tendon exposure and granular wound edges, appears to be healing well,  No warmth, mild bloody drainage, mild swelling to right posterior heel/leg, no erythema, no warmth, no other signs of infection noted, There is also multiple abrasions to toes bilateral with pinpoint bleeding and wounds noted with no surrounding signs of infection. Chronic plantar wounds with reactive keratosis bilateral and dry heme with no surrounding signs of infection.  Capillary fill time <3 seconds in all digits, gross sensation present via light touch bilateral + mild pain to right leg.  No pain with calf compression.  Assessment and Plan:  Problem List Items Addressed This Visit      Endocrine   Diabetic polyneuropathy associated with type 2 diabetes mellitus ([place])     Nervous and Auditory   Dementia ([place])    Other Visit Diagnoses  Postoperative wound dehiscence, subsequent encounter    -  Primary   Leg ulcer, right, with fat layer exposed ([place])       S/P foot surgery, right       Ulcer of foot, limited to breakdown of skin, unspecified laterality ([place])           -Patient seen and evaluated -Applied Mepilex border to the open wound area at the surgical site and bandaids plantarly - Continue with santyl at right leg and recommend skin prep plantar foot wounds bilateral -Continue with Bactrim until finished -Recommend to refrain from walking barefoot or in socks in the hourse -Return in 2 weeks for follow up post op wound care.  In the meantime, patient to call office if any issues or problems arise.   Landis Martins, DPM

## 2019-09-21 ENCOUNTER — Ambulatory Visit (INDEPENDENT_AMBULATORY_CARE_PROVIDER_SITE_OTHER): Payer: Medicare Other | Admitting: Sports Medicine

## 2019-09-21 ENCOUNTER — Encounter: Payer: Self-pay | Admitting: Sports Medicine

## 2019-09-21 ENCOUNTER — Other Ambulatory Visit: Payer: Self-pay

## 2019-09-21 ENCOUNTER — Ambulatory Visit (INDEPENDENT_AMBULATORY_CARE_PROVIDER_SITE_OTHER): Payer: Medicare Other

## 2019-09-21 ENCOUNTER — Telehealth: Payer: Self-pay

## 2019-09-21 DIAGNOSIS — T8131XD Disruption of external operation (surgical) wound, not elsewhere classified, subsequent encounter: Secondary | ICD-10-CM

## 2019-09-21 DIAGNOSIS — L97511 Non-pressure chronic ulcer of other part of right foot limited to breakdown of skin: Secondary | ICD-10-CM

## 2019-09-21 DIAGNOSIS — L03119 Cellulitis of unspecified part of limb: Secondary | ICD-10-CM

## 2019-09-21 DIAGNOSIS — L97912 Non-pressure chronic ulcer of unspecified part of right lower leg with fat layer exposed: Secondary | ICD-10-CM | POA: Diagnosis not present

## 2019-09-21 DIAGNOSIS — Z9889 Other specified postprocedural states: Secondary | ICD-10-CM | POA: Diagnosis not present

## 2019-09-21 DIAGNOSIS — F0150 Vascular dementia without behavioral disturbance: Secondary | ICD-10-CM | POA: Diagnosis not present

## 2019-09-21 DIAGNOSIS — L02619 Cutaneous abscess of unspecified foot: Secondary | ICD-10-CM | POA: Diagnosis not present

## 2019-09-21 DIAGNOSIS — L97522 Non-pressure chronic ulcer of other part of left foot with fat layer exposed: Secondary | ICD-10-CM

## 2019-09-21 DIAGNOSIS — E1142 Type 2 diabetes mellitus with diabetic polyneuropathy: Secondary | ICD-10-CM | POA: Diagnosis not present

## 2019-09-21 DIAGNOSIS — L97501 Non-pressure chronic ulcer of other part of unspecified foot limited to breakdown of skin: Secondary | ICD-10-CM

## 2019-09-21 MED ORDER — FLUCONAZOLE 150 MG PO TABS
150.0000 mg | ORAL_TABLET | Freq: Once | ORAL | 0 refills | Status: DC
Start: 2019-09-21 — End: 2019-09-21

## 2019-09-21 MED ORDER — SULFAMETHOXAZOLE-TRIMETHOPRIM 400-80 MG PO TABS
1.0000 | ORAL_TABLET | Freq: Two times a day (BID) | ORAL | 0 refills | Status: DC
Start: 2019-09-21 — End: 2020-07-24

## 2019-09-21 MED ORDER — FLUCONAZOLE 150 MG PO TABS
150.0000 mg | ORAL_TABLET | Freq: Once | ORAL | 0 refills | Status: AC
Start: 2019-09-21 — End: 2019-09-21

## 2019-09-21 NOTE — Progress Notes (Signed)
Subjective: Cindy Hahn is a 85 y.o. female patient seen today in office for POV # 4 (DOS 06/2019) , S/P R Achilles debridement and I&D, Patient is assisted by daughter who is helping to change dressing on the back of the right leg and home nurse. Reports that there is increase in redness on the right foot and that some of her wounds have started to drain more that have changed since earlier this week reports that her mom has also been struggling with keeping her blood sugars under control and has had chills and confusion with newfound redness and swelling also at the left second toe and issue with her right great toenail a little drainage from the bottom. No other issues noted.   Patient Active Problem List   Diagnosis Date Noted  . Osteomyelitis ([place]) 10/17/2018  . Diabetic foot ulcer ([place]) 05/05/2018  . History of CVA (cerebrovascular accident) 05/05/2018  . Dementia ([place]) 05/05/2018  . Diabetic polyneuropathy associated with type 2 diabetes mellitus ([place]) 06/04/2015  . Pacemaker 03/11/2015  . Bradycardia 02/06/2015  . Chronic atrial fibrillation ([place]) 11/01/2014  . Essential hypertension 11/01/2014  . Diabetes mellitus ([place]) 11/01/2014    Current Outpatient Medications on File Prior to Visit  Medication Sig Dispense Refill  . ALPRAZolam (XANAX) 1 MG tablet Take 1 mg by mouth.    Marland Kitchen aspirin EC 81 MG tablet Take 81 mg by mouth.    . BD INSULIN SYRINGE U/F 31G X 5/16" 1 ML MISC     . collagenase (SANTYL) ointment Apply 1 application topically daily. 30 g 0  . digoxin (LANOXIN) 0.125 MG tablet Take 0.0625 mg by mouth daily.   0  . diltiazem (CARDIZEM) 90 MG tablet Take 1 tablet (90 mg total) by mouth 2 (two) times daily. 60 tablet 11  . gabapentin (NEURONTIN) 100 MG capsule Take 1 capsule (100 mg total) by mouth at bedtime. 30 capsule 3  . LEVEMIR 100 UNIT/ML injection Inject 74 Units into the skin.     . metoprolol tartrate (LOPRESSOR) 50 MG tablet Take 75 mg by mouth 2 (two) times  daily.   0  . naproxen sodium (ALEVE) 220 MG tablet Take 220 mg by mouth daily.    Marland Kitchen NOVOLOG FLEXPEN 100 UNIT/ML FlexPen inject 10 units SUBCUTANEOUSLY WITH MAIN MEAL  0  . potassium chloride SA (K-DUR,KLOR-CON) 20 MEQ tablet Take 20 mEq by mouth daily.   0  . rosuvastatin (CRESTOR) 20 MG tablet   0  . sertraline (ZOLOFT) 100 MG tablet     . silver nitrate applicators 23-53 % applicator Apply to wound every other day then cover with guaze and papertape 100 each 0   No current facility-administered medications on file prior to visit.    Allergies  Allergen Reactions  . Levofloxacin Other (See Comments)    confusion    Objective: There were no vitals filed for this visit.  General: No acute distress, AAOx3  Right posterior heel/leg, Wound/ dehiscence at surgical site centrally that measures 3.1 x1.9x0.3cm with decreased tendon exposure and granular wound edges but central fibrotic base,  No warmth, mild bloody drainage, mild swelling to right posterior heel/leg, no erythema, no warmth, no other signs of infection noted to this area Right submet 1 full-thickness ulceration that measures 3.5 x 1.5 x 0.3 cm does not probe to bone with significant macerated tissue and significant cellulitis that extends to the dorsal aspect of the foot and ankle with mild increase in warmth no purulent drainage  no malodor no other signs of infection. Right great toenail is partially detached with dried blood underneath with no other signs of infection. Left second toe distal tuft there is a granular ulceration that measures 0.5 x 0.5 cm partial-thickness in nature however there is significant blanchable erythema to the second toe with no warmth redness malodor or active drainage. Preulcerative calluses noted submet 1 on left with no signs of infection Partial thickness ulceration noted submet 5 on left measures 0.3 x 0.2 cm with no surrounding signs of infection Left heel there is a full-thickness ulceration  with fatty layer exposed that measures 1 x 1 x0.5cm does not probe to bone but probes to a soft tissue in range with a granular base, no erythema no edema no other active drainage noted.  Capillary fill time <3 seconds in all digits, gross sensation present via light touch bilateral + mild pain to right foot.  No pain with calf compression.  X-rays as below  Assessment and Plan:  Problem List Items Addressed This Visit      Endocrine   Diabetic polyneuropathy associated with type 2 diabetes mellitus ([place])     Nervous and Auditory   Dementia ([place])    Other Visit Diagnoses    Right foot ulcer, limited to breakdown of skin ([place])    -  Primary   Relevant Orders   DG Foot Complete Right   Ulcer of foot, limited to breakdown of skin, unspecified laterality (HCC)       Postoperative wound dehiscence, subsequent encounter       Leg ulcer, right, with fat layer exposed ([place])       S/P foot surgery, right       Chronic foot ulcer with fat layer exposed, left ([place])       Cellulitis and abscess of foot, except toes          -Patient seen and evaluated -X-rays reviewed with no acute signs of osteomyelitis or bony destruction especially at the affected area submet 1 on right patient is status post arthroplasty and bone resection of the first metatarsophalangeal joint on the right - Excisionally dedbrided ulceration at areas listed above to healthy bleeding borders removing nonviable tissue using a sterile chisel blade. Wound measures post debridement as above.  Wounds were debrided to the level of the dermis with viable wound base exposed to promote healing. Hemostasis was achieved with manuel pressure. Patient tolerated procedure well without any discomfort or anesthesia necessary for this wound debridement.  Minimal bleeding noted. -Applied Mepilex border to the open wound area at the surgical site at right posterior heel and applied SurgEx antimicrobial gel to all other ulcerations covered with  dry Band-Aid dressings - Continue with santyl at right leg and recommend skin prep plantar foot wounds bilateral to assist with the Band-Aid sticking after application of the SurgEx gel -Refill Bactrim and Diflucan to take as instructed for increased redness or cellulitis to the right foot -Recommend to refrain from walking barefoot or in socks in the hourse -Return in 2 weeks for follow up post op wound care. In the meantime, patient to call office if any issues or problems arise.   Landis Martins, DPM

## 2019-09-21 NOTE — Telephone Encounter (Signed)
I will re-eval her wounds today Thanks

## 2019-09-21 NOTE — Telephone Encounter (Signed)
Cindy Hahn from [facility] called and LVM stating pt's Lt tendon wound is healing up properly.  Cindy Hahn stated she is now concerned of the pt's new Rt heel open sore with drainage (applied iodosorb and bandaid), and open sore on one of the toes on the right foot (applied betadine swab). Cindy Hahn also stated Pt's Lt old callus area is open back again with drainage, very macerated, and swelling on her anterior foot.  Cindy Hahn stated pt is having more confusion and with chills

## 2019-09-22 ENCOUNTER — Telehealth: Payer: Self-pay | Admitting: *Deleted

## 2019-09-22 NOTE — Telephone Encounter (Signed)
-----   Message from Landis Martins, [place] sent at 09/21/2019  6:37 PM EDT ----- Regarding: Wound care orders Wound care once weekly to apply Santyl to right posterior leg wound covered with dry dressing/meplix border To all other wounds apply samples of the SurgEx antimicrobial gel to her wounds cover with gauze and Band-Aid dressings as tolerated once weekly  Her daughter will help with changing the dressings on other days advised the daughter that the dressing should be changed every other day

## 2019-09-22 NOTE — Telephone Encounter (Signed)
Faxed copy of Dr. Leeanne Rio 09/22/2019 6:37am orders to [facility].

## 2019-09-27 ENCOUNTER — Other Ambulatory Visit: Payer: Self-pay

## 2019-09-27 ENCOUNTER — Telehealth: Payer: Self-pay

## 2019-09-27 ENCOUNTER — Ambulatory Visit (INDEPENDENT_AMBULATORY_CARE_PROVIDER_SITE_OTHER): Payer: Medicare Other | Admitting: Sports Medicine

## 2019-09-27 ENCOUNTER — Encounter: Payer: Self-pay | Admitting: Sports Medicine

## 2019-09-27 DIAGNOSIS — L97501 Non-pressure chronic ulcer of other part of unspecified foot limited to breakdown of skin: Secondary | ICD-10-CM | POA: Diagnosis not present

## 2019-09-27 DIAGNOSIS — T8131XD Disruption of external operation (surgical) wound, not elsewhere classified, subsequent encounter: Secondary | ICD-10-CM

## 2019-09-27 DIAGNOSIS — L97912 Non-pressure chronic ulcer of unspecified part of right lower leg with fat layer exposed: Secondary | ICD-10-CM | POA: Diagnosis not present

## 2019-09-27 DIAGNOSIS — Z9889 Other specified postprocedural states: Secondary | ICD-10-CM

## 2019-09-27 DIAGNOSIS — E1142 Type 2 diabetes mellitus with diabetic polyneuropathy: Secondary | ICD-10-CM | POA: Diagnosis not present

## 2019-09-27 DIAGNOSIS — L97511 Non-pressure chronic ulcer of other part of right foot limited to breakdown of skin: Secondary | ICD-10-CM

## 2019-09-27 MED ORDER — FLUCONAZOLE 150 MG PO TABS
150.0000 mg | ORAL_TABLET | Freq: Every day | ORAL | 1 refills | Status: DC
Start: 2019-09-27 — End: 2019-10-12

## 2019-09-27 NOTE — Telephone Encounter (Signed)
Tiffany from [facility] called stating if Dr. Cannon Kettle can Re-evaluate patient's Rt tendon area. Tiffany states they have been applying the foam patch but it keeps coming off very easly and pt keeps picking on it. Tiffany would also like to confirm if they can do wound care 3 times/week (nurse twice and family once) applying medihoney and alginate to the area.   -plantar looks great only applying dry dressing

## 2019-09-27 NOTE — Telephone Encounter (Signed)
To plantar wound and toes apply surgx gel (patient has samples that I have given to her) and To the right achilles area, d.c santyl and apply medihoney and alginate 3x per week with family helping to do the wound care

## 2019-09-27 NOTE — Progress Notes (Signed)
Subjective: Cindy Hahn is a 85 y.o. female patient seen today in office for POV # 5 (DOS 06/2019) , S/P R Achilles debridement and I&D, Patient is assisted by daughter this visit reports no change everything is about the same redness is doing a little bit better and reports that they needed update on the nursing orders.  No other issues noted.   Patient Active Problem List   Diagnosis Date Noted  . Osteomyelitis ([place]) 10/17/2018  . Diabetic foot ulcer ([place]) 05/05/2018  . History of CVA (cerebrovascular accident) 05/05/2018  . Dementia ([place]) 05/05/2018  . Diabetic polyneuropathy associated with type 2 diabetes mellitus ([place]) 06/04/2015  . Pacemaker 03/11/2015  . Bradycardia 02/06/2015  . Chronic atrial fibrillation ([place]) 11/01/2014  . Essential hypertension 11/01/2014  . Diabetes mellitus ([place]) 11/01/2014    Current Outpatient Medications on File Prior to Visit  Medication Sig Dispense Refill  . ALPRAZolam (XANAX) 1 MG tablet Take 1 mg by mouth.    Marland Kitchen aspirin EC 81 MG tablet Take 81 mg by mouth.    . BD INSULIN SYRINGE U/F 31G X 5/16" 1 ML MISC     . collagenase (SANTYL) ointment Apply 1 application topically daily. 30 g 0  . digoxin (LANOXIN) 0.125 MG tablet Take 0.0625 mg by mouth daily.   0  . diltiazem (CARDIZEM) 90 MG tablet Take 1 tablet (90 mg total) by mouth 2 (two) times daily. 60 tablet 11  . gabapentin (NEURONTIN) 100 MG capsule Take 1 capsule (100 mg total) by mouth at bedtime. 30 capsule 3  . LEVEMIR 100 UNIT/ML injection Inject 74 Units into the skin.     . metoprolol tartrate (LOPRESSOR) 50 MG tablet Take 75 mg by mouth 2 (two) times daily.   0  . naproxen sodium (ALEVE) 220 MG tablet Take 220 mg by mouth daily.    Marland Kitchen NOVOLOG FLEXPEN 100 UNIT/ML FlexPen inject 10 units SUBCUTANEOUSLY WITH MAIN MEAL  0  . potassium chloride SA (K-DUR,KLOR-CON) 20 MEQ tablet Take 20 mEq by mouth daily.   0  . rosuvastatin (CRESTOR) 20 MG tablet   0  . sertraline (ZOLOFT) 100 MG tablet      . silver nitrate applicators 28-31 % applicator Apply to wound every other day then cover with guaze and papertape 100 each 0  . sulfamethoxazole-trimethoprim (BACTRIM) 400-80 MG tablet Take 1 tablet by mouth 2 (two) times daily. 28 tablet 0   No current facility-administered medications on file prior to visit.    Allergies  Allergen Reactions  . Levofloxacin Other (See Comments)    confusion    Objective: There were no vitals filed for this visit.  General: No acute distress, AAOx3  Wound measurements similar to last week's visit Right posterior heel/leg, Wound/ dehiscence at surgical site centrally that measures 3.1 x1.9x0.3cm withtendon exposure and granular wound edges but central fibrotic base,  No warmth, mild bloody drainage, mild swelling to right posterior heel/leg, no erythema, no warmth, no other signs of infection noted to this area Right submet 1 full-thickness ulceration that measures 3.5 x 1.5 x 0.3 cm does not probe to bone with significant macerated tissue and much improved cellulitis, no warmth no purulent drainage no malodor no other signs of infection. Right great toenail remaining aspect is well attached with very minimal dried blood from previous aggressive debridement no signs of infection.  Left second toe distal tuft there is a granular ulceration that measures 0.5 x 0.5 cm partial-thickness in nature however there is  significant blanchable erythema to the second toe with no warmth redness malodor or active drainage. Preulcerative calluses noted submet 1 on left with no signs of infection Partial thickness ulceration noted submet 5 on left measures 0.3 x 0.2 cm with no surrounding signs of infection Left heel there is a full-thickness ulceration with fatty layer exposed that measures 1 x 1 x0.5cm does not probe to bone but probes to a soft tissue in range with a granular base, no erythema no edema no other active drainage noted.  Capillary fill time <3 seconds in  all digits, gross sensation present via light touch bilateral + mild pain to right foot.  No pain with calf compression.  Assessment and Plan:  Problem List Items Addressed This Visit      Endocrine   Diabetic polyneuropathy associated with type 2 diabetes mellitus ([place])    Other Visit Diagnoses    Ulcer of foot, limited to breakdown of skin, unspecified laterality ([place])    -  Primary   Right foot ulcer, limited to breakdown of skin ([place])       Postoperative wound dehiscence, subsequent encounter       Leg ulcer, right, with fat layer exposed ([place])       S/P foot surgery, right          -Patient seen and evaluated -Wounds plans very minimal debridement performed today using a tissue nipper removing any loose skin. -Systems analyst and Prisma with Mepilex border to the open wound area at the surgical site at right posterior heel and applied SurgEx antimicrobial gel to all other ulcerations covered with dry Band-Aid dressings - Continue with wound care as above nursing weekly and family twice weekly -Continue with Bactrim and Diflucan Diflucan refill this visit -Recommend to refrain from walking barefoot or in socks in the house like before continue with shoes that feel comfortable -Return in 2 weeks for follow up post op wound care. In the meantime, patient to call office if any issues or problems arise.   Landis Martins, DPM

## 2019-09-28 DIAGNOSIS — L97501 Non-pressure chronic ulcer of other part of unspecified foot limited to breakdown of skin: Secondary | ICD-10-CM | POA: Diagnosis not present

## 2019-09-28 DIAGNOSIS — E46 Unspecified protein-calorie malnutrition: Secondary | ICD-10-CM | POA: Diagnosis not present

## 2019-09-28 DIAGNOSIS — I482 Chronic atrial fibrillation, unspecified: Secondary | ICD-10-CM | POA: Diagnosis not present

## 2019-09-28 DIAGNOSIS — D631 Anemia in chronic kidney disease: Secondary | ICD-10-CM | POA: Diagnosis not present

## 2019-09-28 DIAGNOSIS — Z794 Long term (current) use of insulin: Secondary | ICD-10-CM | POA: Diagnosis not present

## 2019-09-28 DIAGNOSIS — Z932 Ileostomy status: Secondary | ICD-10-CM | POA: Diagnosis not present

## 2019-09-28 DIAGNOSIS — I739 Peripheral vascular disease, unspecified: Secondary | ICD-10-CM | POA: Diagnosis not present

## 2019-09-28 DIAGNOSIS — Z7982 Long term (current) use of aspirin: Secondary | ICD-10-CM | POA: Diagnosis not present

## 2019-09-28 DIAGNOSIS — R5381 Other malaise: Secondary | ICD-10-CM | POA: Diagnosis not present

## 2019-09-28 DIAGNOSIS — N183 Chronic kidney disease, stage 3 unspecified: Secondary | ICD-10-CM | POA: Diagnosis not present

## 2019-09-28 DIAGNOSIS — X58XXXD Exposure to other specified factors, subsequent encounter: Secondary | ICD-10-CM | POA: Diagnosis not present

## 2019-09-28 DIAGNOSIS — R54 Age-related physical debility: Secondary | ICD-10-CM | POA: Diagnosis not present

## 2019-09-28 DIAGNOSIS — F418 Other specified anxiety disorders: Secondary | ICD-10-CM | POA: Diagnosis not present

## 2019-09-28 DIAGNOSIS — L97511 Non-pressure chronic ulcer of other part of right foot limited to breakdown of skin: Secondary | ICD-10-CM | POA: Diagnosis not present

## 2019-09-28 DIAGNOSIS — I129 Hypertensive chronic kidney disease with stage 1 through stage 4 chronic kidney disease, or unspecified chronic kidney disease: Secondary | ICD-10-CM | POA: Diagnosis not present

## 2019-09-28 DIAGNOSIS — R2689 Other abnormalities of gait and mobility: Secondary | ICD-10-CM | POA: Diagnosis not present

## 2019-09-28 DIAGNOSIS — L97912 Non-pressure chronic ulcer of unspecified part of right lower leg with fat layer exposed: Secondary | ICD-10-CM | POA: Diagnosis not present

## 2019-09-28 DIAGNOSIS — M1990 Unspecified osteoarthritis, unspecified site: Secondary | ICD-10-CM | POA: Diagnosis not present

## 2019-09-28 DIAGNOSIS — Z20828 Contact with and (suspected) exposure to other viral communicable diseases: Secondary | ICD-10-CM | POA: Diagnosis not present

## 2019-09-28 DIAGNOSIS — Z853 Personal history of malignant neoplasm of breast: Secondary | ICD-10-CM | POA: Diagnosis not present

## 2019-09-28 DIAGNOSIS — Z79899 Other long term (current) drug therapy: Secondary | ICD-10-CM | POA: Diagnosis not present

## 2019-09-28 DIAGNOSIS — F0390 Unspecified dementia without behavioral disturbance: Secondary | ICD-10-CM | POA: Diagnosis not present

## 2019-09-28 DIAGNOSIS — Z8673 Personal history of transient ischemic attack (TIA), and cerebral infarction without residual deficits: Secondary | ICD-10-CM | POA: Diagnosis not present

## 2019-09-29 DIAGNOSIS — F0390 Unspecified dementia without behavioral disturbance: Secondary | ICD-10-CM | POA: Diagnosis not present

## 2019-09-29 DIAGNOSIS — L97912 Non-pressure chronic ulcer of unspecified part of right lower leg with fat layer exposed: Secondary | ICD-10-CM | POA: Diagnosis not present

## 2019-09-29 DIAGNOSIS — L97511 Non-pressure chronic ulcer of other part of right foot limited to breakdown of skin: Secondary | ICD-10-CM | POA: Diagnosis not present

## 2019-09-29 DIAGNOSIS — I129 Hypertensive chronic kidney disease with stage 1 through stage 4 chronic kidney disease, or unspecified chronic kidney disease: Secondary | ICD-10-CM | POA: Diagnosis not present

## 2019-09-29 DIAGNOSIS — N183 Chronic kidney disease, stage 3 unspecified: Secondary | ICD-10-CM | POA: Diagnosis not present

## 2019-09-29 DIAGNOSIS — L97501 Non-pressure chronic ulcer of other part of unspecified foot limited to breakdown of skin: Secondary | ICD-10-CM | POA: Diagnosis not present

## 2019-09-29 NOTE — Telephone Encounter (Signed)
Tried contacting Tiffany to review new wound care orders, but no response and LVM stating to return our call

## 2019-10-02 NOTE — Telephone Encounter (Signed)
LVM to tiffany stating and advising new wound care orders from Dr. Cannon Kettle

## 2019-10-03 DIAGNOSIS — L97912 Non-pressure chronic ulcer of unspecified part of right lower leg with fat layer exposed: Secondary | ICD-10-CM | POA: Diagnosis not present

## 2019-10-03 DIAGNOSIS — F0390 Unspecified dementia without behavioral disturbance: Secondary | ICD-10-CM | POA: Diagnosis not present

## 2019-10-03 DIAGNOSIS — I129 Hypertensive chronic kidney disease with stage 1 through stage 4 chronic kidney disease, or unspecified chronic kidney disease: Secondary | ICD-10-CM | POA: Diagnosis not present

## 2019-10-03 DIAGNOSIS — L97501 Non-pressure chronic ulcer of other part of unspecified foot limited to breakdown of skin: Secondary | ICD-10-CM | POA: Diagnosis not present

## 2019-10-03 DIAGNOSIS — N183 Chronic kidney disease, stage 3 unspecified: Secondary | ICD-10-CM | POA: Diagnosis not present

## 2019-10-03 DIAGNOSIS — L97511 Non-pressure chronic ulcer of other part of right foot limited to breakdown of skin: Secondary | ICD-10-CM | POA: Diagnosis not present

## 2019-10-04 ENCOUNTER — Encounter: Payer: Medicare Other | Admitting: Sports Medicine

## 2019-10-05 DIAGNOSIS — F0390 Unspecified dementia without behavioral disturbance: Secondary | ICD-10-CM | POA: Diagnosis not present

## 2019-10-05 DIAGNOSIS — L97511 Non-pressure chronic ulcer of other part of right foot limited to breakdown of skin: Secondary | ICD-10-CM | POA: Diagnosis not present

## 2019-10-05 DIAGNOSIS — L97912 Non-pressure chronic ulcer of unspecified part of right lower leg with fat layer exposed: Secondary | ICD-10-CM | POA: Diagnosis not present

## 2019-10-05 DIAGNOSIS — I129 Hypertensive chronic kidney disease with stage 1 through stage 4 chronic kidney disease, or unspecified chronic kidney disease: Secondary | ICD-10-CM | POA: Diagnosis not present

## 2019-10-05 DIAGNOSIS — L97501 Non-pressure chronic ulcer of other part of unspecified foot limited to breakdown of skin: Secondary | ICD-10-CM | POA: Diagnosis not present

## 2019-10-05 DIAGNOSIS — N183 Chronic kidney disease, stage 3 unspecified: Secondary | ICD-10-CM | POA: Diagnosis not present

## 2019-10-10 DIAGNOSIS — L97511 Non-pressure chronic ulcer of other part of right foot limited to breakdown of skin: Secondary | ICD-10-CM | POA: Diagnosis not present

## 2019-10-10 DIAGNOSIS — L97501 Non-pressure chronic ulcer of other part of unspecified foot limited to breakdown of skin: Secondary | ICD-10-CM | POA: Diagnosis not present

## 2019-10-10 DIAGNOSIS — L97912 Non-pressure chronic ulcer of unspecified part of right lower leg with fat layer exposed: Secondary | ICD-10-CM | POA: Diagnosis not present

## 2019-10-10 DIAGNOSIS — I129 Hypertensive chronic kidney disease with stage 1 through stage 4 chronic kidney disease, or unspecified chronic kidney disease: Secondary | ICD-10-CM | POA: Diagnosis not present

## 2019-10-10 DIAGNOSIS — F0390 Unspecified dementia without behavioral disturbance: Secondary | ICD-10-CM | POA: Diagnosis not present

## 2019-10-10 DIAGNOSIS — N183 Chronic kidney disease, stage 3 unspecified: Secondary | ICD-10-CM | POA: Diagnosis not present

## 2019-10-12 ENCOUNTER — Other Ambulatory Visit: Payer: Self-pay

## 2019-10-12 ENCOUNTER — Encounter: Payer: Medicare Other | Admitting: Sports Medicine

## 2019-10-12 ENCOUNTER — Ambulatory Visit (INDEPENDENT_AMBULATORY_CARE_PROVIDER_SITE_OTHER): Payer: Medicare Other | Admitting: Sports Medicine

## 2019-10-12 DIAGNOSIS — T8131XD Disruption of external operation (surgical) wound, not elsewhere classified, subsequent encounter: Secondary | ICD-10-CM

## 2019-10-12 DIAGNOSIS — L97511 Non-pressure chronic ulcer of other part of right foot limited to breakdown of skin: Secondary | ICD-10-CM

## 2019-10-12 DIAGNOSIS — L97501 Non-pressure chronic ulcer of other part of unspecified foot limited to breakdown of skin: Secondary | ICD-10-CM

## 2019-10-12 DIAGNOSIS — L97912 Non-pressure chronic ulcer of unspecified part of right lower leg with fat layer exposed: Secondary | ICD-10-CM

## 2019-10-12 DIAGNOSIS — E1142 Type 2 diabetes mellitus with diabetic polyneuropathy: Secondary | ICD-10-CM

## 2019-10-12 DIAGNOSIS — L02612 Cutaneous abscess of left foot: Secondary | ICD-10-CM

## 2019-10-12 DIAGNOSIS — Z9889 Other specified postprocedural states: Secondary | ICD-10-CM

## 2019-10-12 DIAGNOSIS — L03032 Cellulitis of left toe: Secondary | ICD-10-CM

## 2019-10-12 MED ORDER — FLUCONAZOLE 150 MG PO TABS: 150.0000 mg | ORAL_TABLET | Freq: Every day | ORAL | 1 refills | Status: DC

## 2019-10-12 MED ORDER — DOXYCYCLINE HYCLATE 100 MG PO TABS
100.0000 mg | ORAL_TABLET | Freq: Two times a day (BID) | ORAL | 0 refills | Status: DC
Start: 2019-10-12 — End: 2021-06-18

## 2019-10-12 NOTE — Progress Notes (Signed)
Subjective: Cindy Hahn is a 85 y.o. female patient seen today in office for POV # 6 (DOS 06/2019) , S/P R Achilles debridement and I&D, Patient is assisted by daughter this visit who reports that nurse wants to try wound vac for right and that the 2nd toe on the left is more red, swollen, wit drainage, all other wounds are looking good. Reports that mom has had issues with blood sugars and episodes of chills and is worried that she is going to spike an infection. No other issues noted.   Patient Active Problem List   Diagnosis Date Noted  . Osteomyelitis ([place]) 10/17/2018  . Diabetic foot ulcer ([place]) 05/05/2018  . History of CVA (cerebrovascular accident) 05/05/2018  . Dementia ([place]) 05/05/2018  . Diabetic polyneuropathy associated with type 2 diabetes mellitus ([place]) 06/04/2015  . Pacemaker 03/11/2015  . Bradycardia 02/06/2015  . Chronic atrial fibrillation ([place]) 11/01/2014  . Essential hypertension 11/01/2014  . Diabetes mellitus ([place]) 11/01/2014    Current Outpatient Medications on File Prior to Visit  Medication Sig Dispense Refill  . ALPRAZolam (XANAX) 1 MG tablet Take 1 mg by mouth.    Marland Kitchen aspirin EC 81 MG tablet Take 81 mg by mouth.    . BD INSULIN SYRINGE U/F 31G X 5/16" 1 ML MISC     . collagenase (SANTYL) ointment Apply 1 application topically daily. 30 g 0  . digoxin (LANOXIN) 0.125 MG tablet Take 0.0625 mg by mouth daily.   0  . diltiazem (CARDIZEM) 90 MG tablet Take 1 tablet (90 mg total) by mouth 2 (two) times daily. 60 tablet 11  . gabapentin (NEURONTIN) 100 MG capsule Take 1 capsule (100 mg total) by mouth at bedtime. 30 capsule 3  . LEVEMIR 100 UNIT/ML injection Inject 74 Units into the skin.     . metoprolol tartrate (LOPRESSOR) 50 MG tablet Take 75 mg by mouth 2 (two) times daily.   0  . naproxen sodium (ALEVE) 220 MG tablet Take 220 mg by mouth daily.    Marland Kitchen NOVOLOG FLEXPEN 100 UNIT/ML FlexPen inject 10 units SUBCUTANEOUSLY WITH MAIN MEAL  0  . potassium chloride SA  (K-DUR,KLOR-CON) 20 MEQ tablet Take 20 mEq by mouth daily.   0  . rosuvastatin (CRESTOR) 20 MG tablet   0  . sertraline (ZOLOFT) 100 MG tablet     . silver nitrate applicators 72-62 % applicator Apply to wound every other day then cover with guaze and papertape 100 each 0  . sulfamethoxazole-trimethoprim (BACTRIM) 400-80 MG tablet Take 1 tablet by mouth 2 (two) times daily. 28 tablet 0   No current facility-administered medications on file prior to visit.    Allergies  Allergen Reactions  . Levofloxacin Other (See Comments)    confusion    Objective: There were no vitals filed for this visit.  General: No acute distress, AAOx3  Right posterior heel/leg, Wound/ dehiscence at surgical site centrally that measures 3.2 x1.1x0.3cm with tendon exposure and granular wound edges but central fibrotic base with tendon fibers that are yellow in nature,  No warmth, mild bloody drainage, mild swelling to right posterior heel/leg, no erythema, no warmth, no other signs of infection noted to this area  Right submet 1 now partial thickness ulceration that measures 2.5x 1.5 x 0.3 cm does not probe to bone with macerated tissue and much improved cellulitis, no warmth no purulent drainage no malodor no other signs of infection.  Left second toe distal tuft there is a granular ulceration with  active bloody drainage that measures 1 x 0.8x0.4 cm NOW FULL thickness in nature and probes close to bone, there is significant blanchable erythema/cellutitis to the second toe at level of MTPJ with no warmth or malodor.   Preulcerative calluses noted submet 1 on left with no signs of infection  Previous ulceration noted submet 5 on left and heel is now healed over  Capillary fill time <3 seconds in all digits, gross sensation present via light touch bilateral + mild pain to right foot.  No pain with calf compression.  Assessment and Plan:  Problem List Items Addressed This Visit      Endocrine   Diabetic  polyneuropathy associated with type 2 diabetes mellitus ([place])    Other Visit Diagnoses    Ulcer of foot, limited to breakdown of skin, unspecified laterality ([place])    -  Primary   Right foot ulcer, limited to breakdown of skin ([place])       Postoperative wound dehiscence, subsequent encounter       Leg ulcer, right, with fat layer exposed ([place])       S/P foot surgery, right       Cellulitis and abscess of toe of left foot          -Patient seen and evaluated -To all wounds very minimal debridement performed today using a tissue nipper removing any loose skin keratotic skin as needed to wound sites bilateral without major discomfort -Applied Mepilex border to the open wound area at the surgical site at right posterior heel/leg and applied to left 2nd toe prisma and to all remaining ulcers SurgEx antimicrobial gel to all other ulcerations covered with dry Band-Aid dressings -Continue with wound care as above nursing weekly and family twice weekly -Advised to monitor temp and vitals if worsens return to office or go to ER -Rx Doxcycline to see if this will help the left 2nd toe & Diflucan -Request placed for snap vac for right heel/achilles ulcer -Return in 1 week for follow up post op wound care. In the meantime, patient to call office if any issues or problems arise.   Landis Martins, DPM

## 2019-10-13 ENCOUNTER — Telehealth: Payer: Self-pay | Admitting: *Deleted

## 2019-10-13 NOTE — Telephone Encounter (Addendum)
Left message informing [employer] - S. Cindy Hahn I would be faxing IVR for SnapVac with required form, current chart note, op note and 1st POV to [phone] for pre-cert, and to call if she needed anything else. Faxed afore mentioned information to [employer] for pre-cert for SnapVac to S. Cindy Hahn, and [employer].

## 2019-10-13 NOTE — Telephone Encounter (Signed)
Entered in error

## 2019-10-13 NOTE — Telephone Encounter (Signed)
-----   Message from Landis Martins, [place] sent at 10/12/2019  8:08 PM EDT ----- Regarding: [employer] snap vac Wound vac for right achilles 3.2x1 with tendon exposure

## 2019-10-16 NOTE — Telephone Encounter (Signed)
Patient will need enough supplies for at least 30 days SNAPT 165mHg Therapy Cartridge SNAPT Advanced Dressing Kit (Foam) - 10 x 10cm with foam wound filler (8cm x 8cm) SNAPT Therapy Strap - Small 46cm strap

## 2019-10-16 NOTE — Telephone Encounter (Signed)
Received IVR of benefits, [place]. [place] - Accounts Recievable/Insurance Coordinator states pt will be covered 100% for SnapVac.

## 2019-10-17 ENCOUNTER — Telehealth: Payer: Self-pay

## 2019-10-17 DIAGNOSIS — L97501 Non-pressure chronic ulcer of other part of unspecified foot limited to breakdown of skin: Secondary | ICD-10-CM | POA: Diagnosis not present

## 2019-10-17 DIAGNOSIS — L97912 Non-pressure chronic ulcer of unspecified part of right lower leg with fat layer exposed: Secondary | ICD-10-CM | POA: Diagnosis not present

## 2019-10-17 DIAGNOSIS — F0390 Unspecified dementia without behavioral disturbance: Secondary | ICD-10-CM | POA: Diagnosis not present

## 2019-10-17 DIAGNOSIS — L97511 Non-pressure chronic ulcer of other part of right foot limited to breakdown of skin: Secondary | ICD-10-CM | POA: Diagnosis not present

## 2019-10-17 DIAGNOSIS — I129 Hypertensive chronic kidney disease with stage 1 through stage 4 chronic kidney disease, or unspecified chronic kidney disease: Secondary | ICD-10-CM | POA: Diagnosis not present

## 2019-10-17 DIAGNOSIS — N183 Chronic kidney disease, stage 3 unspecified: Secondary | ICD-10-CM | POA: Diagnosis not present

## 2019-10-17 NOTE — Telephone Encounter (Signed)
Migdalia Dk is working on the wound vac orders for this patient and working with Theresia Majors to get everything ordered -Dr. Chauncey Cruel

## 2019-10-17 NOTE — Telephone Encounter (Signed)
Shann Medal - [employer] will be in [place] pt's appt tomorrow for application and states [facility] may not aapp

## 2019-10-17 NOTE — Telephone Encounter (Signed)
Yes Cindy Hahn got her rescheduled for 430pm tomorrow -Dr. Cannon Kettle

## 2019-10-17 NOTE — Telephone Encounter (Signed)
Pt's daughter called stating they still have not received any wound vac orders yet, if there is any hold up? Please advice

## 2019-10-17 NOTE — Telephone Encounter (Signed)
I spoke with pt's dtr, Cecille Rubin and informed that the SnapVac rep would be in the [place] office tomorrow with the wound vac supplies. Cecille Rubin states she cancelled the appt for tomorrow because the toe that is unrelated to the reason for the SnapVac, had improved. Cecille Rubin states she works with [facility] and the apply wound vac. I told her SnapVac was different and if they did not use SnapVac Dr. Cannon Kettle would peerform the changes in the office, and the SnapVac rep was checking with [facility] I instructed Cecille Rubin to get the pt back into the appt tomorrow.

## 2019-10-17 NOTE — Telephone Encounter (Signed)
Spoke with pt's daughter and advised her that our [place] is working with Theresia Majors to get everything ordered.

## 2019-10-17 NOTE — Telephone Encounter (Signed)
Emailed note requesting status of the SnapVac for pt.

## 2019-10-18 ENCOUNTER — Other Ambulatory Visit: Payer: Self-pay

## 2019-10-18 ENCOUNTER — Ambulatory Visit: Payer: Medicare Other | Admitting: Sports Medicine

## 2019-10-18 ENCOUNTER — Encounter: Payer: Self-pay | Admitting: Sports Medicine

## 2019-10-18 ENCOUNTER — Ambulatory Visit (INDEPENDENT_AMBULATORY_CARE_PROVIDER_SITE_OTHER): Payer: Medicare Other | Admitting: Sports Medicine

## 2019-10-18 DIAGNOSIS — Z9889 Other specified postprocedural states: Secondary | ICD-10-CM

## 2019-10-18 DIAGNOSIS — L97501 Non-pressure chronic ulcer of other part of unspecified foot limited to breakdown of skin: Secondary | ICD-10-CM

## 2019-10-18 DIAGNOSIS — E1142 Type 2 diabetes mellitus with diabetic polyneuropathy: Secondary | ICD-10-CM

## 2019-10-18 DIAGNOSIS — L97912 Non-pressure chronic ulcer of unspecified part of right lower leg with fat layer exposed: Secondary | ICD-10-CM

## 2019-10-18 DIAGNOSIS — T8131XD Disruption of external operation (surgical) wound, not elsewhere classified, subsequent encounter: Secondary | ICD-10-CM

## 2019-10-18 NOTE — Progress Notes (Signed)
Subjective: Cindy Hahn is a 85 y.o. female patient seen today in office for POV #7 (DOS 06/2019) , S/P R Achilles debridement and I&D, Patient is assisted by daughter this visit who reports that the right second toe is looking much better less red swollen very minimal drainage from the distal tuft wound.  Patient had a birthday and reports that she had some additional cake and blood sugar today 230.  Patient is currently taking doxycycline antibiotics and denies any problems with this antibiotic or nausea vomiting fever chills.  Patient is also here for application of snap wound VAC to right Achilles area at wound dehiscence.  No other issues noted.   Patient Active Problem List   Diagnosis Date Noted  . Osteomyelitis ([place]) 10/17/2018  . Diabetic foot ulcer ([place]) 05/05/2018  . History of CVA (cerebrovascular accident) 05/05/2018  . Dementia ([place]) 05/05/2018  . Diabetic polyneuropathy associated with type 2 diabetes mellitus ([place]) 06/04/2015  . Pacemaker 03/11/2015  . Bradycardia 02/06/2015  . Chronic atrial fibrillation ([place]) 11/01/2014  . Essential hypertension 11/01/2014  . Diabetes mellitus ([place]) 11/01/2014    Current Outpatient Medications on File Prior to Visit  Medication Sig Dispense Refill  . ALPRAZolam (XANAX) 1 MG tablet Take 1 mg by mouth.    Marland Kitchen aspirin EC 81 MG tablet Take 81 mg by mouth.    . BD INSULIN SYRINGE U/F 31G X 5/16" 1 ML MISC     . collagenase (SANTYL) ointment Apply 1 application topically daily. 30 g 0  . digoxin (LANOXIN) 0.125 MG tablet Take 0.0625 mg by mouth daily.   0  . diltiazem (CARDIZEM) 90 MG tablet Take 1 tablet (90 mg total) by mouth 2 (two) times daily. 60 tablet 11  . doxycycline (VIBRA-TABS) 100 MG tablet Take 1 tablet (100 mg total) by mouth 2 (two) times daily. 20 tablet 0  . fluconazole (DIFLUCAN) 150 MG tablet Take 1 tablet (150 mg total) by mouth daily. 10 tablet 1  . gabapentin (NEURONTIN) 100 MG capsule Take 1 capsule (100 mg total) by  mouth at bedtime. 30 capsule 3  . LEVEMIR 100 UNIT/ML injection Inject 74 Units into the skin.     . metoprolol tartrate (LOPRESSOR) 50 MG tablet Take 75 mg by mouth 2 (two) times daily.   0  . naproxen sodium (ALEVE) 220 MG tablet Take 220 mg by mouth daily.    Marland Kitchen NOVOLOG FLEXPEN 100 UNIT/ML FlexPen inject 10 units SUBCUTANEOUSLY WITH MAIN MEAL  0  . potassium chloride SA (K-DUR,KLOR-CON) 20 MEQ tablet Take 20 mEq by mouth daily.   0  . rosuvastatin (CRESTOR) 20 MG tablet   0  . sertraline (ZOLOFT) 100 MG tablet     . silver nitrate applicators 82-50 % applicator Apply to wound every other day then cover with guaze and papertape 100 each 0  . sulfamethoxazole-trimethoprim (BACTRIM) 400-80 MG tablet Take 1 tablet by mouth 2 (two) times daily. 28 tablet 0   No current facility-administered medications on file prior to visit.    Allergies  Allergen Reactions  . Levofloxacin Other (See Comments)    confusion    Objective: There were no vitals filed for this visit.  General: No acute distress, AAOx3  Right posterior heel/leg, Wound/ dehiscence at surgical site centrally that measures 3.5 x1.5x0.3cm with tendon exposure and granular wound edges but central fibrotic base with tendon fibers that are yellow in nature,  No warmth, mild bloody drainage, mild swelling to right posterior heel/leg, no  erythema, no warmth, no other signs of infection noted to this area  Right submet 1 now partial thickness ulceration that measures 1x 0.5 x 0.3 cm does not probe to bone with macerated tissue and much improved cellulitis, no warmth no purulent drainage no malodor no other signs of infection.  Left second toe distal tuft there is a granular ulceration with active bloody drainage that measures 0.5 x 0.4x0.3 cm continue full thickness in nature and probes close to bone, there is significant blanchable erythema/cellutitis to the second toe at level of MTPJ with no warmth or malodor that is slowly improving as  compared to last week.   Preulcerative calluses noted submet 1 on left with no signs of infection  Previous ulceration noted submet 5 on left and heel that remains callused over  Capillary fill time <3 seconds in all digits, gross sensation present via light touch bilateral + mild pain to right foot.  No pain with calf compression.  Assessment and Plan:  Problem List Items Addressed This Visit      Endocrine   Diabetic polyneuropathy associated with type 2 diabetes mellitus ([place])    Other Visit Diagnoses    Leg ulcer, right, with fat layer exposed ([place])    -  Primary   Postoperative wound dehiscence, subsequent encounter       S/P foot surgery, right       Ulcer of foot, limited to breakdown of skin, unspecified laterality ([place])          -Patient seen and evaluated -To all wounds very minimal debridement performed today using a saline moistened gauze -Applied protective Band-Aid dressings to all wound and to the right posterior heel applied snap wound VAC using sponge and hydrocolloid VAC paper and tubing with cartridge and seal intact at 125 mmHg and strapped safely to patient's leg; educated patient and daughter on proper check for the seal on the wound VAC and for proper care -Home nurse is to continue with monitoring the snap VAC and to use plunger if the steal breaks to reset it and to continue with Prisma to the right second toe and to all other wounds Surgiex antimicrobial gel cover with Band-Aid dressings as patient can tolerate -Continue with doxycycline until completed -Patient to return in 1 week for VAC change on the right Achilles area and to have Band-Aids replaced on other wound sites of which she is using SurgiEx antimicrobial gel to these areas with help from home nursing.  Landis Martins, DPM

## 2019-10-19 ENCOUNTER — Telehealth: Payer: Self-pay | Admitting: *Deleted

## 2019-10-19 DIAGNOSIS — L97501 Non-pressure chronic ulcer of other part of unspecified foot limited to breakdown of skin: Secondary | ICD-10-CM | POA: Diagnosis not present

## 2019-10-19 DIAGNOSIS — L97912 Non-pressure chronic ulcer of unspecified part of right lower leg with fat layer exposed: Secondary | ICD-10-CM | POA: Diagnosis not present

## 2019-10-19 DIAGNOSIS — N183 Chronic kidney disease, stage 3 unspecified: Secondary | ICD-10-CM | POA: Diagnosis not present

## 2019-10-19 DIAGNOSIS — F0390 Unspecified dementia without behavioral disturbance: Secondary | ICD-10-CM | POA: Diagnosis not present

## 2019-10-19 DIAGNOSIS — I129 Hypertensive chronic kidney disease with stage 1 through stage 4 chronic kidney disease, or unspecified chronic kidney disease: Secondary | ICD-10-CM | POA: Diagnosis not present

## 2019-10-19 DIAGNOSIS — L97511 Non-pressure chronic ulcer of other part of right foot limited to breakdown of skin: Secondary | ICD-10-CM | POA: Diagnosis not present

## 2019-10-19 NOTE — Telephone Encounter (Signed)
Faxed copy of Dr. Leeanne Rio 10/19/2019 1:07pm orders to [facility].

## 2019-10-19 NOTE — Telephone Encounter (Signed)
-----   Message from Landis Martins, [place] sent at 10/18/2019 11:18 PM EDT ----- Regarding: [facility] wound care home nursing updated orders Home nurse is to continue with monitoring the snap VAC (Dr. Cannon Kettle or change this weekly in office) and to use plunger if the steal breaks to reset it and to continue with Prisma to the right second toe and to all other wounds Surgiex antimicrobial gel cover with Band-Aid dressings as patient can tolerate

## 2019-10-24 ENCOUNTER — Other Ambulatory Visit: Payer: Self-pay

## 2019-10-24 ENCOUNTER — Encounter: Payer: Self-pay | Admitting: Podiatry

## 2019-10-24 ENCOUNTER — Ambulatory Visit (INDEPENDENT_AMBULATORY_CARE_PROVIDER_SITE_OTHER): Payer: Medicare Other | Admitting: Podiatry

## 2019-10-24 DIAGNOSIS — L97501 Non-pressure chronic ulcer of other part of unspecified foot limited to breakdown of skin: Secondary | ICD-10-CM | POA: Diagnosis not present

## 2019-10-24 DIAGNOSIS — L97912 Non-pressure chronic ulcer of unspecified part of right lower leg with fat layer exposed: Secondary | ICD-10-CM

## 2019-10-24 DIAGNOSIS — Z1231 Encounter for screening mammogram for malignant neoplasm of breast: Secondary | ICD-10-CM | POA: Diagnosis not present

## 2019-10-24 NOTE — Progress Notes (Addendum)
  Subjective:  Patient ID: Cindy Hahn, female    DOB: 1934-12-18,  MRN: [id number]  Chief Complaint  Patient presents with  . Foot Ulcer    the right back of the heel is doing ok and the left foot has a few spots on it as well    85 y.o. female presents for wound care. Hx confirmed with patient. Patient presents with daughter who is a nurse who assists in her care. Objective:  Physical Exam: Wound Location: right Achilles area Wound Measurement: 3.5x1.5 post debridement Wound Base: Fibrotic slough Peri-wound: Normal Exudate: None: wound tissue dry wound without warmth, erythema, signs of acute infection  Right 1st MPJ ulceration measuring 1x0.6 superficial Right 2nd toe 0.4x0.4 with dry fibrotic wound base Pre-ulcerative HPKs submet 1,5 left and left heel  All wounds without warmth, erythema, signs of acute infection. Assessment:   1. Leg ulcer, right, with fat layer exposed ([place])   2. Ulcer of foot, limited to breakdown of skin, unspecified laterality [facility])      Plan:  Patient was evaluated and treated and all questions answered.  Ulcer right Achilles -Wound cleansed and debrided; devitalized tendon carefully removed.  Iodosorb applied prior to [facility] application -Snap VAC reapplied. Pt's daughter to remove Thursday.  1st MPJ, 2nd toe ulcerations -Debrided, dressed with Band-Aids and Silvadene -Patient to dress at home with SurgiEx (did not bring today)  Procedure: Selective Debridement of Wound Rationale: Removal of devitalized tissue from the wound to promote healing.  Pre-Debridement Wound Measurements: 3.5 cm x 1 point.5 cm x 0.3 cm  Post-Debridement Wound Measurements: same as pre-debridement. Type of Debridement: sharp selective Tissue Removed: Devitalized soft-tissue Dressing: Dry, sterile, compression dressing. Disposition: Patient tolerated procedure well. Patient to return in 1 week for follow-up.  Procedure: Mechanical Wound VAC Application Location:  right Achilles Wound Measurement: 3.5 cm x 1.5 cm x 0.3 cm  Technique: Blue foam to wound base, followed by hydrocolloid 0-Ring, followed by hydrocolloid dressing. Plunger maximally depressed with with good seal noted. Disposition: Patient tolerated procedure well.  No follow-ups on file.

## 2019-10-25 DIAGNOSIS — L97511 Non-pressure chronic ulcer of other part of right foot limited to breakdown of skin: Secondary | ICD-10-CM | POA: Diagnosis not present

## 2019-10-25 DIAGNOSIS — L97501 Non-pressure chronic ulcer of other part of unspecified foot limited to breakdown of skin: Secondary | ICD-10-CM | POA: Diagnosis not present

## 2019-10-25 DIAGNOSIS — I129 Hypertensive chronic kidney disease with stage 1 through stage 4 chronic kidney disease, or unspecified chronic kidney disease: Secondary | ICD-10-CM | POA: Diagnosis not present

## 2019-10-25 DIAGNOSIS — L97912 Non-pressure chronic ulcer of unspecified part of right lower leg with fat layer exposed: Secondary | ICD-10-CM | POA: Diagnosis not present

## 2019-10-25 DIAGNOSIS — N183 Chronic kidney disease, stage 3 unspecified: Secondary | ICD-10-CM | POA: Diagnosis not present

## 2019-10-25 DIAGNOSIS — F0390 Unspecified dementia without behavioral disturbance: Secondary | ICD-10-CM | POA: Diagnosis not present

## 2019-10-27 DIAGNOSIS — L97501 Non-pressure chronic ulcer of other part of unspecified foot limited to breakdown of skin: Secondary | ICD-10-CM | POA: Diagnosis not present

## 2019-10-27 DIAGNOSIS — L97912 Non-pressure chronic ulcer of unspecified part of right lower leg with fat layer exposed: Secondary | ICD-10-CM | POA: Diagnosis not present

## 2019-10-27 DIAGNOSIS — Z170 Estrogen receptor positive status [ER+]: Secondary | ICD-10-CM | POA: Diagnosis not present

## 2019-10-27 DIAGNOSIS — L97511 Non-pressure chronic ulcer of other part of right foot limited to breakdown of skin: Secondary | ICD-10-CM | POA: Diagnosis not present

## 2019-10-27 DIAGNOSIS — F0390 Unspecified dementia without behavioral disturbance: Secondary | ICD-10-CM | POA: Diagnosis not present

## 2019-10-27 DIAGNOSIS — Z853 Personal history of malignant neoplasm of breast: Secondary | ICD-10-CM | POA: Diagnosis not present

## 2019-10-27 DIAGNOSIS — C50411 Malignant neoplasm of upper-outer quadrant of right female breast: Secondary | ICD-10-CM | POA: Diagnosis not present

## 2019-10-27 DIAGNOSIS — I129 Hypertensive chronic kidney disease with stage 1 through stage 4 chronic kidney disease, or unspecified chronic kidney disease: Secondary | ICD-10-CM | POA: Diagnosis not present

## 2019-10-27 DIAGNOSIS — N183 Chronic kidney disease, stage 3 unspecified: Secondary | ICD-10-CM | POA: Diagnosis not present

## 2019-10-28 DIAGNOSIS — R5381 Other malaise: Secondary | ICD-10-CM | POA: Diagnosis not present

## 2019-10-28 DIAGNOSIS — L97511 Non-pressure chronic ulcer of other part of right foot limited to breakdown of skin: Secondary | ICD-10-CM | POA: Diagnosis not present

## 2019-10-28 DIAGNOSIS — Z7982 Long term (current) use of aspirin: Secondary | ICD-10-CM | POA: Diagnosis not present

## 2019-10-28 DIAGNOSIS — Z794 Long term (current) use of insulin: Secondary | ICD-10-CM | POA: Diagnosis not present

## 2019-10-28 DIAGNOSIS — L97912 Non-pressure chronic ulcer of unspecified part of right lower leg with fat layer exposed: Secondary | ICD-10-CM | POA: Diagnosis not present

## 2019-10-28 DIAGNOSIS — F0390 Unspecified dementia without behavioral disturbance: Secondary | ICD-10-CM | POA: Diagnosis not present

## 2019-10-28 DIAGNOSIS — L97501 Non-pressure chronic ulcer of other part of unspecified foot limited to breakdown of skin: Secondary | ICD-10-CM | POA: Diagnosis not present

## 2019-10-28 DIAGNOSIS — Z20828 Contact with and (suspected) exposure to other viral communicable diseases: Secondary | ICD-10-CM | POA: Diagnosis not present

## 2019-10-28 DIAGNOSIS — M1990 Unspecified osteoarthritis, unspecified site: Secondary | ICD-10-CM | POA: Diagnosis not present

## 2019-10-28 DIAGNOSIS — Z853 Personal history of malignant neoplasm of breast: Secondary | ICD-10-CM | POA: Diagnosis not present

## 2019-10-28 DIAGNOSIS — F418 Other specified anxiety disorders: Secondary | ICD-10-CM | POA: Diagnosis not present

## 2019-10-28 DIAGNOSIS — Z79899 Other long term (current) drug therapy: Secondary | ICD-10-CM | POA: Diagnosis not present

## 2019-10-28 DIAGNOSIS — R54 Age-related physical debility: Secondary | ICD-10-CM | POA: Diagnosis not present

## 2019-10-28 DIAGNOSIS — R2689 Other abnormalities of gait and mobility: Secondary | ICD-10-CM | POA: Diagnosis not present

## 2019-10-28 DIAGNOSIS — Z8673 Personal history of transient ischemic attack (TIA), and cerebral infarction without residual deficits: Secondary | ICD-10-CM | POA: Diagnosis not present

## 2019-10-28 DIAGNOSIS — X58XXXD Exposure to other specified factors, subsequent encounter: Secondary | ICD-10-CM | POA: Diagnosis not present

## 2019-10-28 DIAGNOSIS — I482 Chronic atrial fibrillation, unspecified: Secondary | ICD-10-CM | POA: Diagnosis not present

## 2019-10-28 DIAGNOSIS — D631 Anemia in chronic kidney disease: Secondary | ICD-10-CM | POA: Diagnosis not present

## 2019-10-28 DIAGNOSIS — N183 Chronic kidney disease, stage 3 unspecified: Secondary | ICD-10-CM | POA: Diagnosis not present

## 2019-10-28 DIAGNOSIS — I739 Peripheral vascular disease, unspecified: Secondary | ICD-10-CM | POA: Diagnosis not present

## 2019-10-28 DIAGNOSIS — Z932 Ileostomy status: Secondary | ICD-10-CM | POA: Diagnosis not present

## 2019-10-28 DIAGNOSIS — I129 Hypertensive chronic kidney disease with stage 1 through stage 4 chronic kidney disease, or unspecified chronic kidney disease: Secondary | ICD-10-CM | POA: Diagnosis not present

## 2019-10-28 DIAGNOSIS — E46 Unspecified protein-calorie malnutrition: Secondary | ICD-10-CM | POA: Diagnosis not present

## 2019-10-30 DIAGNOSIS — N183 Chronic kidney disease, stage 3 unspecified: Secondary | ICD-10-CM | POA: Diagnosis not present

## 2019-10-30 DIAGNOSIS — F0390 Unspecified dementia without behavioral disturbance: Secondary | ICD-10-CM | POA: Diagnosis not present

## 2019-10-30 DIAGNOSIS — I129 Hypertensive chronic kidney disease with stage 1 through stage 4 chronic kidney disease, or unspecified chronic kidney disease: Secondary | ICD-10-CM | POA: Diagnosis not present

## 2019-10-30 DIAGNOSIS — L97511 Non-pressure chronic ulcer of other part of right foot limited to breakdown of skin: Secondary | ICD-10-CM | POA: Diagnosis not present

## 2019-10-30 DIAGNOSIS — L97501 Non-pressure chronic ulcer of other part of unspecified foot limited to breakdown of skin: Secondary | ICD-10-CM | POA: Diagnosis not present

## 2019-10-30 DIAGNOSIS — L97912 Non-pressure chronic ulcer of unspecified part of right lower leg with fat layer exposed: Secondary | ICD-10-CM | POA: Diagnosis not present

## 2019-11-01 ENCOUNTER — Encounter: Payer: Self-pay | Admitting: Sports Medicine

## 2019-11-01 ENCOUNTER — Ambulatory Visit (INDEPENDENT_AMBULATORY_CARE_PROVIDER_SITE_OTHER): Payer: Medicare Other | Admitting: Sports Medicine

## 2019-11-01 DIAGNOSIS — L97501 Non-pressure chronic ulcer of other part of unspecified foot limited to breakdown of skin: Secondary | ICD-10-CM | POA: Diagnosis not present

## 2019-11-01 DIAGNOSIS — I129 Hypertensive chronic kidney disease with stage 1 through stage 4 chronic kidney disease, or unspecified chronic kidney disease: Secondary | ICD-10-CM | POA: Diagnosis not present

## 2019-11-01 DIAGNOSIS — L97511 Non-pressure chronic ulcer of other part of right foot limited to breakdown of skin: Secondary | ICD-10-CM | POA: Diagnosis not present

## 2019-11-01 DIAGNOSIS — N183 Chronic kidney disease, stage 3 unspecified: Secondary | ICD-10-CM | POA: Diagnosis not present

## 2019-11-01 DIAGNOSIS — Z9889 Other specified postprocedural states: Secondary | ICD-10-CM

## 2019-11-01 DIAGNOSIS — F0390 Unspecified dementia without behavioral disturbance: Secondary | ICD-10-CM | POA: Diagnosis not present

## 2019-11-01 DIAGNOSIS — E1142 Type 2 diabetes mellitus with diabetic polyneuropathy: Secondary | ICD-10-CM

## 2019-11-01 DIAGNOSIS — L97912 Non-pressure chronic ulcer of unspecified part of right lower leg with fat layer exposed: Secondary | ICD-10-CM | POA: Diagnosis not present

## 2019-11-01 DIAGNOSIS — F0150 Vascular dementia without behavioral disturbance: Secondary | ICD-10-CM

## 2019-11-01 DIAGNOSIS — T8131XD Disruption of external operation (surgical) wound, not elsewhere classified, subsequent encounter: Secondary | ICD-10-CM

## 2019-11-01 NOTE — Progress Notes (Signed)
Subjective: KATELEE SCHUPP is a 85 y.o. female patient seen today in office for POV # 9 (DOS 06/2019) , S/P R Achilles debridement and I&D, Patient is assisted by daughter this visit who reports that she had to reinforce the snap VAC with Tegaderm at the edges.No other issues noted.   Patient Active Problem List   Diagnosis Date Noted   Osteomyelitis ([place]) 10/17/2018   Diabetic foot ulcer ([place]) 05/05/2018   History of CVA (cerebrovascular accident) 05/05/2018   Dementia ([place]) 05/05/2018   Diabetic polyneuropathy associated with type 2 diabetes mellitus ([place]) 06/04/2015   Pacemaker 03/11/2015   Bradycardia 02/06/2015   Chronic atrial fibrillation ([place]) 11/01/2014   Essential hypertension 11/01/2014   Diabetes mellitus ([place]) 11/01/2014    Current Outpatient Medications on File Prior to Visit  Medication Sig Dispense Refill   ALPRAZolam (XANAX) 1 MG tablet Take 1 mg by mouth.     aspirin EC 81 MG tablet Take 81 mg by mouth.     BD INSULIN SYRINGE U/F 31G X 5/16" 1 ML MISC      collagenase (SANTYL) ointment Apply 1 application topically daily. 30 g 0   digoxin (LANOXIN) 0.125 MG tablet Take 0.0625 mg by mouth daily.   0   diltiazem (CARDIZEM) 90 MG tablet Take 1 tablet (90 mg total) by mouth 2 (two) times daily. 60 tablet 11   doxycycline (VIBRA-TABS) 100 MG tablet Take 1 tablet (100 mg total) by mouth 2 (two) times daily. 20 tablet 0   fluconazole (DIFLUCAN) 150 MG tablet Take 1 tablet (150 mg total) by mouth daily. 10 tablet 1   gabapentin (NEURONTIN) 100 MG capsule Take 1 capsule (100 mg total) by mouth at bedtime. 30 capsule 3   LEVEMIR 100 UNIT/ML injection Inject 74 Units into the skin.      metoprolol tartrate (LOPRESSOR) 50 MG tablet Take 75 mg by mouth 2 (two) times daily.   0   naproxen sodium (ALEVE) 220 MG tablet Take 220 mg by mouth daily.     NOVOLOG FLEXPEN 100 UNIT/ML FlexPen inject 10 units SUBCUTANEOUSLY WITH MAIN MEAL  0   potassium chloride SA  (K-DUR,KLOR-CON) 20 MEQ tablet Take 20 mEq by mouth daily.   0   rosuvastatin (CRESTOR) 20 MG tablet   0   sertraline (ZOLOFT) 100 MG tablet      silver nitrate applicators 70-96 % applicator Apply to wound every other day then cover with guaze and papertape 100 each 0   sulfamethoxazole-trimethoprim (BACTRIM) 400-80 MG tablet Take 1 tablet by mouth 2 (two) times daily. 28 tablet 0   No current facility-administered medications on file prior to visit.    Allergies  Allergen Reactions   Levofloxacin Other (See Comments)    confusion    Objective: There were no vitals filed for this visit.  General: No acute distress, AAOx3  Right posterior heel/leg, Wound/ dehiscence at surgical site centrally that measures 3.5 x1.5x0.3cm with tendon exposure and granular wound edges but central fibrotic base with tendon fibers that are yellow in nature, same as previous,  No warmth, mild bloody drainage, mild swelling to right posterior heel/leg, no erythema, no warmth, no other signs of infection noted to this area  Right submet 1partial thickness ulceration that measures 0.8x 0.4 x 0.3 cm does not probe to bone with macerated tissue and much improved cellulitis, no warmth no purulent drainage no malodor no other signs of infection.  Left second toe distal tuft there is a granular ulceration  with active bloody drainage that measures 0.1 x 0.2x0.2 cm now partial thickness in nature previous probing to bone resolved, there is resolving erythema/cellutitis to the second toe at level of MTPJ with no warmth or malodor that is slowly improving as compared to last week.   Preulcerative calluses noted submet 1 on left with no signs of infection  Previous ulceration noted submet 5 on left and heel that remains callused over however on today's exam at the left heel there was noted to be a reopening that measures 0.1 x 0.2 x 0.4 cm in depth with a granular base to the left central heel.  Capillary fill time  <3 seconds in all digits, gross sensation present via light touch bilateral + mild pain to right foot.  No pain with calf compression.  Assessment and Plan:  Problem List Items Addressed This Visit      Endocrine   Diabetic polyneuropathy associated with type 2 diabetes mellitus ([place])     Nervous and Auditory   Dementia ([place])    Other Visit Diagnoses    Leg ulcer, right, with fat layer exposed ([place])    -  Primary   Ulcer of foot, limited to breakdown of skin, unspecified laterality ([place])       Postoperative wound dehiscence, subsequent encounter       S/P foot surgery, right          -Patient seen and evaluated -To all wounds very minimal debridement performed today using a saline moistened gauze and tissue nippers to patient's tolerance -Applied SurgiEx and protective Band-Aid dressings to all wound and to the right posterior heel applied snap wound VAC using sponge and hydrocolloid VAC paper and tubing with cartridge and seal intact at 100 mmHg and strapped safely to patient's leg; educated patient and daughter on proper check for the seal on the wound VAC and for proper care -Home nurse is to continue with monitoring the snap VAC and to use plunger if the steal breaks to reset it and to continue with Prisma to the right second toe and to all other wounds SurgiEx antimicrobial gel cover with Band-Aid dressings as patient can tolerate -Patient to return in 1 week for VAC change on the right Achilles area and continued wound care bilateral.  Landis Martins, DPM

## 2019-11-02 DIAGNOSIS — E113412 Type 2 diabetes mellitus with severe nonproliferative diabetic retinopathy with macular edema, left eye: Secondary | ICD-10-CM | POA: Diagnosis not present

## 2019-11-02 DIAGNOSIS — E113411 Type 2 diabetes mellitus with severe nonproliferative diabetic retinopathy with macular edema, right eye: Secondary | ICD-10-CM | POA: Diagnosis not present

## 2019-11-06 DIAGNOSIS — L97511 Non-pressure chronic ulcer of other part of right foot limited to breakdown of skin: Secondary | ICD-10-CM | POA: Diagnosis not present

## 2019-11-06 DIAGNOSIS — L97501 Non-pressure chronic ulcer of other part of unspecified foot limited to breakdown of skin: Secondary | ICD-10-CM | POA: Diagnosis not present

## 2019-11-06 DIAGNOSIS — F0390 Unspecified dementia without behavioral disturbance: Secondary | ICD-10-CM | POA: Diagnosis not present

## 2019-11-06 DIAGNOSIS — I129 Hypertensive chronic kidney disease with stage 1 through stage 4 chronic kidney disease, or unspecified chronic kidney disease: Secondary | ICD-10-CM | POA: Diagnosis not present

## 2019-11-06 DIAGNOSIS — L97912 Non-pressure chronic ulcer of unspecified part of right lower leg with fat layer exposed: Secondary | ICD-10-CM | POA: Diagnosis not present

## 2019-11-06 DIAGNOSIS — N183 Chronic kidney disease, stage 3 unspecified: Secondary | ICD-10-CM | POA: Diagnosis not present

## 2019-11-08 ENCOUNTER — Other Ambulatory Visit: Payer: Self-pay

## 2019-11-08 ENCOUNTER — Encounter: Payer: Self-pay | Admitting: Sports Medicine

## 2019-11-08 ENCOUNTER — Ambulatory Visit (INDEPENDENT_AMBULATORY_CARE_PROVIDER_SITE_OTHER): Payer: Medicare Other | Admitting: Sports Medicine

## 2019-11-08 DIAGNOSIS — F0150 Vascular dementia without behavioral disturbance: Secondary | ICD-10-CM

## 2019-11-08 DIAGNOSIS — E1142 Type 2 diabetes mellitus with diabetic polyneuropathy: Secondary | ICD-10-CM

## 2019-11-08 DIAGNOSIS — Z9889 Other specified postprocedural states: Secondary | ICD-10-CM

## 2019-11-08 DIAGNOSIS — L97912 Non-pressure chronic ulcer of unspecified part of right lower leg with fat layer exposed: Secondary | ICD-10-CM

## 2019-11-08 DIAGNOSIS — T8131XD Disruption of external operation (surgical) wound, not elsewhere classified, subsequent encounter: Secondary | ICD-10-CM

## 2019-11-08 DIAGNOSIS — L97501 Non-pressure chronic ulcer of other part of unspecified foot limited to breakdown of skin: Secondary | ICD-10-CM

## 2019-11-08 NOTE — Progress Notes (Addendum)
Subjective: Cindy Hahn is a 85 y.o. female patient seen today in office for POV #10 (DOS 06/2019) , S/P R Achilles debridement and I&D, Patient is assisted by daughter this visit who reports that she had to reinforce the snap VAC with Tegaderm at the edges again right after her last visit.  Reports minimal drainage from wounds.No other issues noted.   Patient Active Problem List   Diagnosis Date Noted  . Osteomyelitis ([place]) 10/17/2018  . Diabetic foot ulcer ([place]) 05/05/2018  . History of CVA (cerebrovascular accident) 05/05/2018  . Dementia ([place]) 05/05/2018  . Diabetic polyneuropathy associated with type 2 diabetes mellitus ([place]) 06/04/2015  . Pacemaker 03/11/2015  . Bradycardia 02/06/2015  . Chronic atrial fibrillation ([place]) 11/01/2014  . Essential hypertension 11/01/2014  . Diabetes mellitus ([place]) 11/01/2014    Current Outpatient Medications on File Prior to Visit  Medication Sig Dispense Refill  . ALPRAZolam (XANAX) 1 MG tablet Take 1 mg by mouth.    Marland Kitchen aspirin EC 81 MG tablet Take 81 mg by mouth.    . BD INSULIN SYRINGE U/F 31G X 5/16" 1 ML MISC     . collagenase (SANTYL) ointment Apply 1 application topically daily. 30 g 0  . digoxin (LANOXIN) 0.125 MG tablet Take 0.0625 mg by mouth daily.   0  . diltiazem (CARDIZEM) 90 MG tablet Take 1 tablet (90 mg total) by mouth 2 (two) times daily. 60 tablet 11  . doxycycline (VIBRA-TABS) 100 MG tablet Take 1 tablet (100 mg total) by mouth 2 (two) times daily. 20 tablet 0  . fluconazole (DIFLUCAN) 150 MG tablet Take 1 tablet (150 mg total) by mouth daily. 10 tablet 1  . gabapentin (NEURONTIN) 100 MG capsule Take 1 capsule (100 mg total) by mouth at bedtime. 30 capsule 3  . LEVEMIR 100 UNIT/ML injection Inject 74 Units into the skin.     . metoprolol tartrate (LOPRESSOR) 50 MG tablet Take 75 mg by mouth 2 (two) times daily.   0  . naproxen sodium (ALEVE) 220 MG tablet Take 220 mg by mouth daily.    Marland Kitchen NOVOLOG FLEXPEN 100 UNIT/ML FlexPen  inject 10 units SUBCUTANEOUSLY WITH MAIN MEAL  0  . potassium chloride SA (K-DUR,KLOR-CON) 20 MEQ tablet Take 20 mEq by mouth daily.   0  . rosuvastatin (CRESTOR) 20 MG tablet   0  . sertraline (ZOLOFT) 100 MG tablet     . silver nitrate applicators 82-50 % applicator Apply to wound every other day then cover with guaze and papertape 100 each 0  . sulfamethoxazole-trimethoprim (BACTRIM) 400-80 MG tablet Take 1 tablet by mouth 2 (two) times daily. 28 tablet 0   No current facility-administered medications on file prior to visit.    Allergies  Allergen Reactions  . Levofloxacin Other (See Comments)    confusion    Objective: There were no vitals filed for this visit.  General: No acute distress, AAOx3  Right posterior heel/leg, Wound/ dehiscence at surgical site centrally that measures 4 x2.0x0.3cm with tendon exposure and granular wound edges but central fibrotic base with tendon fibers that are yellow in nature, same as previous however at the edges today there is some increased maceration likely from the hydrocolloid ring,  No warmth, mild bloody drainage, mild swelling to right posterior heel/leg, no other signs of infection noted to this area  Right submet 1partial thickness ulceration that measures 0.6x 0.2x 0.3 cm does not probe to bone with macerated tissue and much improved cellulitis, no warmth  no purulent drainage no malodor no other signs of infection.  Left second toe distal tuft there is a granular ulceration with active bloody drainage that measures 0.1 x 0.2x0.2 cm now partial thickness in nature previous probing to bone resolved, there is resolving erythema/cellutitis to the second toe at level of MTPJ with no warmth or malodor that is slowly improving as compared to last week.   Preulcerative calluses noted submet 1 on left with no signs of infection  Previous ulceration noted submet 5 on left and heel that remains callused over however on today's exam at the left heel  there was noted to be a reopening that measures 0.1 x 0.2 x 0.4 cm in depth with a granular base to the left central heel.  Capillary fill time <3 seconds in all digits, gross sensation present via light touch bilateral + mild pain to right foot.  No pain with calf compression.  Assessment and Plan:  Problem List Items Addressed This Visit      Endocrine   Diabetic polyneuropathy associated with type 2 diabetes mellitus ([place])     Nervous and Auditory   Dementia ([place])    Other Visit Diagnoses    Leg ulcer, right, with fat layer exposed ([place])    -  Primary   Ulcer of foot, limited to breakdown of skin, unspecified laterality ([employer] [place])       Postoperative wound dehiscence, subsequent encounter       S/P foot surgery, right          -Patient seen and evaluated -To all wounds very minimal debridement performed today using a saline moistened gauze and tissue nippers to patient's tolerance at the right Achilles removing any loose tendinous fibers. -Applied SurgiEx and protective Band-Aid dressings to all wound and to the right posterior heel applied snap wound VAC using sponge and hydrocolloid VAC paper and tubing with cartridge and seal intact at 125 mmHg and strapped safely to patient's leg; educated patient and daughter on proper check for the seal on the wound VAC and for proper care -Home nurse is to continue with monitoring the snap VAC and to use plunger if the steal breaks to reset it and to continue with Prisma to the right second toe and to all other wounds SurgiEx antimicrobial gel cover with Band-Aid dressings as patient can tolerate like before -Patient to return in 1 week for VAC change on the right Achilles area and continued wound care bilateral.  Landis Martins, DPM

## 2019-11-14 DIAGNOSIS — L97912 Non-pressure chronic ulcer of unspecified part of right lower leg with fat layer exposed: Secondary | ICD-10-CM | POA: Diagnosis not present

## 2019-11-14 DIAGNOSIS — L97501 Non-pressure chronic ulcer of other part of unspecified foot limited to breakdown of skin: Secondary | ICD-10-CM | POA: Diagnosis not present

## 2019-11-14 DIAGNOSIS — F0390 Unspecified dementia without behavioral disturbance: Secondary | ICD-10-CM | POA: Diagnosis not present

## 2019-11-14 DIAGNOSIS — L97511 Non-pressure chronic ulcer of other part of right foot limited to breakdown of skin: Secondary | ICD-10-CM | POA: Diagnosis not present

## 2019-11-14 DIAGNOSIS — N183 Chronic kidney disease, stage 3 unspecified: Secondary | ICD-10-CM | POA: Diagnosis not present

## 2019-11-14 DIAGNOSIS — I129 Hypertensive chronic kidney disease with stage 1 through stage 4 chronic kidney disease, or unspecified chronic kidney disease: Secondary | ICD-10-CM | POA: Diagnosis not present

## 2019-11-15 ENCOUNTER — Other Ambulatory Visit: Payer: Self-pay

## 2019-11-15 ENCOUNTER — Encounter: Payer: Self-pay | Admitting: Sports Medicine

## 2019-11-15 ENCOUNTER — Ambulatory Visit (INDEPENDENT_AMBULATORY_CARE_PROVIDER_SITE_OTHER): Payer: Medicare Other | Admitting: Sports Medicine

## 2019-11-15 DIAGNOSIS — L97511 Non-pressure chronic ulcer of other part of right foot limited to breakdown of skin: Secondary | ICD-10-CM

## 2019-11-15 DIAGNOSIS — L97501 Non-pressure chronic ulcer of other part of unspecified foot limited to breakdown of skin: Secondary | ICD-10-CM

## 2019-11-15 DIAGNOSIS — Z9889 Other specified postprocedural states: Secondary | ICD-10-CM

## 2019-11-15 DIAGNOSIS — E1142 Type 2 diabetes mellitus with diabetic polyneuropathy: Secondary | ICD-10-CM

## 2019-11-15 DIAGNOSIS — L97912 Non-pressure chronic ulcer of unspecified part of right lower leg with fat layer exposed: Secondary | ICD-10-CM | POA: Diagnosis not present

## 2019-11-15 DIAGNOSIS — F0150 Vascular dementia without behavioral disturbance: Secondary | ICD-10-CM

## 2019-11-15 NOTE — Progress Notes (Signed)
Subjective: Cindy Hahn is a 85 y.o. female patient seen today in office for POV #11 (DOS 06/2019) , S/P R Achilles debridement and I&D, Patient is assisted by daughter this visit who reports tha she thinks the back started acting up but left it intact was not sucking anymore as of yesterday and thinks that there could have been a little bit more drainage in the back that could have caused this. No other issues noted.   Patient Active Problem List   Diagnosis Date Noted   Osteomyelitis ([place]) 10/17/2018   Diabetic foot ulcer ([place]) 05/05/2018   History of CVA (cerebrovascular accident) 05/05/2018   Dementia ([place]) 05/05/2018   Diabetic polyneuropathy associated with type 2 diabetes mellitus ([place]) 06/04/2015   Pacemaker 03/11/2015   Bradycardia 02/06/2015   Chronic atrial fibrillation ([place]) 11/01/2014   Essential hypertension 11/01/2014   Diabetes mellitus ([place]) 11/01/2014    Current Outpatient Medications on File Prior to Visit  Medication Sig Dispense Refill   ALPRAZolam (XANAX) 1 MG tablet Take 1 mg by mouth.     aspirin EC 81 MG tablet Take 81 mg by mouth.     BD INSULIN SYRINGE U/F 31G X 5/16" 1 ML MISC      collagenase (SANTYL) ointment Apply 1 application topically daily. 30 g 0   digoxin (LANOXIN) 0.125 MG tablet Take 0.0625 mg by mouth daily.   0   diltiazem (CARDIZEM) 90 MG tablet Take 1 tablet (90 mg total) by mouth 2 (two) times daily. 60 tablet 11   doxycycline (VIBRA-TABS) 100 MG tablet Take 1 tablet (100 mg total) by mouth 2 (two) times daily. 20 tablet 0   fluconazole (DIFLUCAN) 150 MG tablet Take 1 tablet (150 mg total) by mouth daily. 10 tablet 1   gabapentin (NEURONTIN) 100 MG capsule Take 1 capsule (100 mg total) by mouth at bedtime. 30 capsule 3   LEVEMIR 100 UNIT/ML injection Inject 74 Units into the skin.      metoprolol tartrate (LOPRESSOR) 50 MG tablet Take 75 mg by mouth 2 (two) times daily.   0   naproxen sodium (ALEVE) 220 MG tablet  Take 220 mg by mouth daily.     NOVOLOG FLEXPEN 100 UNIT/ML FlexPen inject 10 units SUBCUTANEOUSLY WITH MAIN MEAL  0   potassium chloride SA (K-DUR,KLOR-CON) 20 MEQ tablet Take 20 mEq by mouth daily.   0   rosuvastatin (CRESTOR) 20 MG tablet   0   sertraline (ZOLOFT) 100 MG tablet      silver nitrate applicators 62-37 % applicator Apply to wound every other day then cover with guaze and papertape 100 each 0   sulfamethoxazole-trimethoprim (BACTRIM) 400-80 MG tablet Take 1 tablet by mouth 2 (two) times daily. 28 tablet 0   No current facility-administered medications on file prior to visit.    Allergies  Allergen Reactions   Levofloxacin Other (See Comments)    confusion    Objective: There were no vitals filed for this visit.  General: No acute distress, AAOx3  Right posterior heel/leg, Wound/ dehiscence at surgical site centrally that measures 3.8 x 1.8 x0.3cm with tendon exposure and  Very few granular buds at wound edges but central fibrotic base with tendon fibers that are yellow in nature, No warmth, mild bloody drainage, mild swelling to right posterior heel/leg, no other signs of infection noted to this area  Right submet 1partial thickness ulceration that measures 0.5x 0.2x 0.3 cm does not probe to bone with macerated tissue and much improved  cellulitis, no warmth no purulent drainage no malodor no other signs of infection.  Left second toe distal tuft appears to be prematurely healed, no cellulitis.    Preulcerative calluses noted submet 1 on left with no signs of infection  Previous ulceration noted submet 5 on left and heel that remains callused over with no re-opening.  Capillary fill time <3 seconds in all digits, gross sensation present via light touch bilateral + mild pain to right foot.  No pain with calf compression.  Assessment and Plan:  Problem List Items Addressed This Visit      Endocrine   Diabetic polyneuropathy associated with type 2 diabetes  mellitus ([place])     Nervous and Auditory   Dementia ([place])    Other Visit Diagnoses    Leg ulcer, right, with fat layer exposed ([place])    -  Primary   Ulcer of foot, limited to breakdown of skin, unspecified laterality ([place])       S/P foot surgery, right       Right foot ulcer, limited to breakdown of skin ([place])          -Patient seen and evaluated -To all wounds very minimal debridement performed today using a saline moistened gauze and tissue nipper and chisel blade to patient's tolerance  -Recommend SurgiEx  When they get home sine we were out of this med in office and protective Band-Aid dressings to all wound and to the right posterior heel applied Oasis trilayer matrix 3x3.5cm lot [id number] secured with adaptic and steristrips and 4x4 guaze and abd, and mesh stockinette sleeve -Home nurse orders sent for wound care as above -Return to office in 1 week or sooner if problems arise.   Landis Martins, DPM

## 2019-11-16 ENCOUNTER — Telehealth: Payer: Self-pay

## 2019-11-16 NOTE — Telephone Encounter (Signed)
Contacted [id number]  And gave new wound care orders from Dr. Cannon Kettle. Per Dr. Cannon Kettle once weekly change guaze and ABD pad to Rt leg and keep intact steri strips and adaptic and continue with surgiex gel and bandaids on other wounds

## 2019-11-16 NOTE — Telephone Encounter (Signed)
-----   Message from Landis Martins, [place] sent at 11/15/2019  9:24 PM EDT ----- Regarding: Nursing orders: [facility] Please let patient home nurse know that a Oasis graft was placed on the right Achilles area once weekly please change the gauze and ABD pad to the right leg and keep intact the Steri-Strips and Adaptic.  To all other wounds may continue with Surgiex antimicrobial gel and Band-Aids.

## 2019-11-21 DIAGNOSIS — L97501 Non-pressure chronic ulcer of other part of unspecified foot limited to breakdown of skin: Secondary | ICD-10-CM | POA: Diagnosis not present

## 2019-11-21 DIAGNOSIS — N183 Chronic kidney disease, stage 3 unspecified: Secondary | ICD-10-CM | POA: Diagnosis not present

## 2019-11-21 DIAGNOSIS — I129 Hypertensive chronic kidney disease with stage 1 through stage 4 chronic kidney disease, or unspecified chronic kidney disease: Secondary | ICD-10-CM | POA: Diagnosis not present

## 2019-11-21 DIAGNOSIS — L97511 Non-pressure chronic ulcer of other part of right foot limited to breakdown of skin: Secondary | ICD-10-CM | POA: Diagnosis not present

## 2019-11-21 DIAGNOSIS — F0390 Unspecified dementia without behavioral disturbance: Secondary | ICD-10-CM | POA: Diagnosis not present

## 2019-11-21 DIAGNOSIS — L97912 Non-pressure chronic ulcer of unspecified part of right lower leg with fat layer exposed: Secondary | ICD-10-CM | POA: Diagnosis not present

## 2019-11-22 ENCOUNTER — Other Ambulatory Visit: Payer: Self-pay

## 2019-11-22 ENCOUNTER — Ambulatory Visit (INDEPENDENT_AMBULATORY_CARE_PROVIDER_SITE_OTHER): Payer: Medicare Other | Admitting: Sports Medicine

## 2019-11-22 ENCOUNTER — Encounter: Payer: Self-pay | Admitting: Sports Medicine

## 2019-11-22 DIAGNOSIS — Z9889 Other specified postprocedural states: Secondary | ICD-10-CM

## 2019-11-22 DIAGNOSIS — L97501 Non-pressure chronic ulcer of other part of unspecified foot limited to breakdown of skin: Secondary | ICD-10-CM | POA: Diagnosis not present

## 2019-11-22 DIAGNOSIS — L97912 Non-pressure chronic ulcer of unspecified part of right lower leg with fat layer exposed: Secondary | ICD-10-CM

## 2019-11-22 DIAGNOSIS — F0150 Vascular dementia without behavioral disturbance: Secondary | ICD-10-CM

## 2019-11-22 DIAGNOSIS — E1142 Type 2 diabetes mellitus with diabetic polyneuropathy: Secondary | ICD-10-CM | POA: Diagnosis not present

## 2019-11-22 NOTE — Progress Notes (Signed)
Subjective: Cindy Hahn is a 85 y.o. female patient seen today in office for POV #12 (DOS 06/2019) , S/P R Achilles debridement and I&D, Patient is assisted by daughter this visit who reports that the wound matrix stayed in place, no issues. Patient admits some pain at left 5th toe. No other issues noted.   Patient Active Problem List   Diagnosis Date Noted  . Osteomyelitis ([place]) 10/17/2018  . Diabetic foot ulcer ([place]) 05/05/2018  . History of CVA (cerebrovascular accident) 05/05/2018  . Dementia ([place]) 05/05/2018  . Diabetic polyneuropathy associated with type 2 diabetes mellitus ([place]) 06/04/2015  . Pacemaker 03/11/2015  . Bradycardia 02/06/2015  . Chronic atrial fibrillation ([place]) 11/01/2014  . Essential hypertension 11/01/2014  . Diabetes mellitus ([place]) 11/01/2014    Current Outpatient Medications on File Prior to Visit  Medication Sig Dispense Refill  . ALPRAZolam (XANAX) 1 MG tablet Take 1 mg by mouth.    Marland Kitchen aspirin EC 81 MG tablet Take 81 mg by mouth.    . BD INSULIN SYRINGE U/F 31G X 5/16" 1 ML MISC     . collagenase (SANTYL) ointment Apply 1 application topically daily. 30 g 0  . digoxin (LANOXIN) 0.125 MG tablet Take 0.0625 mg by mouth daily.   0  . diltiazem (CARDIZEM) 90 MG tablet Take 1 tablet (90 mg total) by mouth 2 (two) times daily. 60 tablet 11  . doxycycline (VIBRA-TABS) 100 MG tablet Take 1 tablet (100 mg total) by mouth 2 (two) times daily. 20 tablet 0  . fluconazole (DIFLUCAN) 150 MG tablet Take 1 tablet (150 mg total) by mouth daily. 10 tablet 1  . gabapentin (NEURONTIN) 100 MG capsule Take 1 capsule (100 mg total) by mouth at bedtime. 30 capsule 3  . LEVEMIR 100 UNIT/ML injection Inject 74 Units into the skin.     . metoprolol tartrate (LOPRESSOR) 50 MG tablet Take 75 mg by mouth 2 (two) times daily.   0  . naproxen sodium (ALEVE) 220 MG tablet Take 220 mg by mouth daily.    Marland Kitchen NOVOLOG FLEXPEN 100 UNIT/ML FlexPen inject 10 units SUBCUTANEOUSLY WITH MAIN MEAL  0   . potassium chloride SA (K-DUR,KLOR-CON) 20 MEQ tablet Take 20 mEq by mouth daily.   0  . rosuvastatin (CRESTOR) 20 MG tablet   0  . sertraline (ZOLOFT) 100 MG tablet     . silver nitrate applicators 26-83 % applicator Apply to wound every other day then cover with guaze and papertape 100 each 0  . sulfamethoxazole-trimethoprim (BACTRIM) 400-80 MG tablet Take 1 tablet by mouth 2 (two) times daily. 28 tablet 0   No current facility-administered medications on file prior to visit.    Allergies  Allergen Reactions  . Levofloxacin Other (See Comments)    confusion    Objective: There were no vitals filed for this visit.  General: No acute distress, AAOx3  Right posterior heel/leg, Wound/ dehiscence at surgical site centrally that measures 3.8 x 1.5 x0.3cm with tendon exposure and  Very few granular buds at wound edges but central fibrotic base with tendon fibers that are yellow in nature, No warmth, mild bloody drainage, mild swelling to right posterior heel/leg, no other signs of infection noted to this area  Right submet 1partial thickness ulceration that measures 0.4x 0.2x 0.3 cm does not probe to bone with macerated tissue and much improved cellulitis, no warmth no purulent drainage no malodor no other signs of infection.  Left second toe distal tuft appears to be  prematurely healed, no cellulitis.    Preulcerative calluses noted submet 1 on left with no signs of infection  Previous ulceration noted submet 5 on left and heel that remains callused over with no re-opening.  Capillary fill time <3 seconds in all digits, gross sensation present via light touch bilateral + mild pain to right foot.  No pain with calf compression.  Assessment and Plan:  Problem List Items Addressed This Visit      Endocrine   Diabetic polyneuropathy associated with type 2 diabetes mellitus ([place])     Nervous and Auditory   Dementia ([place])    Other Visit Diagnoses    Leg ulcer, right, with fat layer  exposed ([place])    -  Primary   Ulcer of foot, limited to breakdown of skin, unspecified laterality ([place])       S/P foot surgery, right          -Patient seen and evaluated -To all wounds very minimal debridement performed today using a saline moistened gauze and tissue nipper and chisel blade to patient's tolerance  -Recommend SurgiEx  When they get home sine we were out of this med in office and protective Band-Aid dressings to all wound and to the right posterior heel applied Oasis trilayer matrix 3x3.5cm lot [id number], application #2 secured with adaptic and steristrips and 4x4 guaze and abd, and mesh stockinette sleeve -Home nurse orders sent for wound care as above -Return to office in 1 week or sooner if problems arise.   Landis Martins, DPM

## 2019-11-24 DIAGNOSIS — L97511 Non-pressure chronic ulcer of other part of right foot limited to breakdown of skin: Secondary | ICD-10-CM | POA: Diagnosis not present

## 2019-11-24 DIAGNOSIS — L97501 Non-pressure chronic ulcer of other part of unspecified foot limited to breakdown of skin: Secondary | ICD-10-CM | POA: Diagnosis not present

## 2019-11-24 DIAGNOSIS — L97912 Non-pressure chronic ulcer of unspecified part of right lower leg with fat layer exposed: Secondary | ICD-10-CM | POA: Diagnosis not present

## 2019-11-24 DIAGNOSIS — F0390 Unspecified dementia without behavioral disturbance: Secondary | ICD-10-CM | POA: Diagnosis not present

## 2019-11-24 DIAGNOSIS — N183 Chronic kidney disease, stage 3 unspecified: Secondary | ICD-10-CM | POA: Diagnosis not present

## 2019-11-24 DIAGNOSIS — I129 Hypertensive chronic kidney disease with stage 1 through stage 4 chronic kidney disease, or unspecified chronic kidney disease: Secondary | ICD-10-CM | POA: Diagnosis not present

## 2019-11-27 DIAGNOSIS — I482 Chronic atrial fibrillation, unspecified: Secondary | ICD-10-CM | POA: Diagnosis not present

## 2019-11-27 DIAGNOSIS — Z853 Personal history of malignant neoplasm of breast: Secondary | ICD-10-CM | POA: Diagnosis not present

## 2019-11-27 DIAGNOSIS — Z8673 Personal history of transient ischemic attack (TIA), and cerebral infarction without residual deficits: Secondary | ICD-10-CM | POA: Diagnosis not present

## 2019-11-27 DIAGNOSIS — R5381 Other malaise: Secondary | ICD-10-CM | POA: Diagnosis not present

## 2019-11-27 DIAGNOSIS — E46 Unspecified protein-calorie malnutrition: Secondary | ICD-10-CM | POA: Diagnosis not present

## 2019-11-27 DIAGNOSIS — F418 Other specified anxiety disorders: Secondary | ICD-10-CM | POA: Diagnosis not present

## 2019-11-27 DIAGNOSIS — D631 Anemia in chronic kidney disease: Secondary | ICD-10-CM | POA: Diagnosis not present

## 2019-11-27 DIAGNOSIS — I129 Hypertensive chronic kidney disease with stage 1 through stage 4 chronic kidney disease, or unspecified chronic kidney disease: Secondary | ICD-10-CM | POA: Diagnosis not present

## 2019-11-27 DIAGNOSIS — L97511 Non-pressure chronic ulcer of other part of right foot limited to breakdown of skin: Secondary | ICD-10-CM | POA: Diagnosis not present

## 2019-11-27 DIAGNOSIS — L97501 Non-pressure chronic ulcer of other part of unspecified foot limited to breakdown of skin: Secondary | ICD-10-CM | POA: Diagnosis not present

## 2019-11-27 DIAGNOSIS — Z20828 Contact with and (suspected) exposure to other viral communicable diseases: Secondary | ICD-10-CM | POA: Diagnosis not present

## 2019-11-27 DIAGNOSIS — R54 Age-related physical debility: Secondary | ICD-10-CM | POA: Diagnosis not present

## 2019-11-27 DIAGNOSIS — Z7982 Long term (current) use of aspirin: Secondary | ICD-10-CM | POA: Diagnosis not present

## 2019-11-27 DIAGNOSIS — Z932 Ileostomy status: Secondary | ICD-10-CM | POA: Diagnosis not present

## 2019-11-27 DIAGNOSIS — L97912 Non-pressure chronic ulcer of unspecified part of right lower leg with fat layer exposed: Secondary | ICD-10-CM | POA: Diagnosis not present

## 2019-11-27 DIAGNOSIS — Z79899 Other long term (current) drug therapy: Secondary | ICD-10-CM | POA: Diagnosis not present

## 2019-11-27 DIAGNOSIS — Z794 Long term (current) use of insulin: Secondary | ICD-10-CM | POA: Diagnosis not present

## 2019-11-27 DIAGNOSIS — R2689 Other abnormalities of gait and mobility: Secondary | ICD-10-CM | POA: Diagnosis not present

## 2019-11-27 DIAGNOSIS — M1990 Unspecified osteoarthritis, unspecified site: Secondary | ICD-10-CM | POA: Diagnosis not present

## 2019-11-27 DIAGNOSIS — I739 Peripheral vascular disease, unspecified: Secondary | ICD-10-CM | POA: Diagnosis not present

## 2019-11-27 DIAGNOSIS — X58XXXD Exposure to other specified factors, subsequent encounter: Secondary | ICD-10-CM | POA: Diagnosis not present

## 2019-11-27 DIAGNOSIS — F0390 Unspecified dementia without behavioral disturbance: Secondary | ICD-10-CM | POA: Diagnosis not present

## 2019-11-27 DIAGNOSIS — N183 Chronic kidney disease, stage 3 unspecified: Secondary | ICD-10-CM | POA: Diagnosis not present

## 2019-11-29 ENCOUNTER — Encounter: Payer: Self-pay | Admitting: Sports Medicine

## 2019-11-29 ENCOUNTER — Other Ambulatory Visit: Payer: Self-pay

## 2019-11-29 ENCOUNTER — Ambulatory Visit (INDEPENDENT_AMBULATORY_CARE_PROVIDER_SITE_OTHER): Payer: Medicare Other | Admitting: Sports Medicine

## 2019-11-29 DIAGNOSIS — L97912 Non-pressure chronic ulcer of unspecified part of right lower leg with fat layer exposed: Secondary | ICD-10-CM | POA: Diagnosis not present

## 2019-11-29 DIAGNOSIS — Z9889 Other specified postprocedural states: Secondary | ICD-10-CM

## 2019-11-29 DIAGNOSIS — F0150 Vascular dementia without behavioral disturbance: Secondary | ICD-10-CM

## 2019-11-29 DIAGNOSIS — E1142 Type 2 diabetes mellitus with diabetic polyneuropathy: Secondary | ICD-10-CM

## 2019-11-29 DIAGNOSIS — L97501 Non-pressure chronic ulcer of other part of unspecified foot limited to breakdown of skin: Secondary | ICD-10-CM

## 2019-11-29 NOTE — Progress Notes (Signed)
Subjective: Cindy Hahn is a 85 y.o. female patient seen today in office for POV #13 (DOS 06/2019) , S/P R Achilles debridement and I&D, Patient is assisted by daughter this visit who reports that the wound matrix stayed in place but did not notice a little bit of odor.  Patient denies any other pedal complaints at this time. No other issues noted.   Patient Active Problem List   Diagnosis Date Noted  . Osteomyelitis ([place]) 10/17/2018  . Diabetic foot ulcer ([place]) 05/05/2018  . History of CVA (cerebrovascular accident) 05/05/2018  . Dementia ([place]) 05/05/2018  . Diabetic polyneuropathy associated with type 2 diabetes mellitus ([place]) 06/04/2015  . Pacemaker 03/11/2015  . Bradycardia 02/06/2015  . Chronic atrial fibrillation ([place]) 11/01/2014  . Essential hypertension 11/01/2014  . Diabetes mellitus ([place]) 11/01/2014    Current Outpatient Medications on File Prior to Visit  Medication Sig Dispense Refill  . ALPRAZolam (XANAX) 1 MG tablet Take 1 mg by mouth.    Marland Kitchen aspirin EC 81 MG tablet Take 81 mg by mouth.    . BD INSULIN SYRINGE U/F 31G X 5/16" 1 ML MISC     . collagenase (SANTYL) ointment Apply 1 application topically daily. 30 g 0  . digoxin (LANOXIN) 0.125 MG tablet Take 0.0625 mg by mouth daily.   0  . diltiazem (CARDIZEM) 90 MG tablet Take 1 tablet (90 mg total) by mouth 2 (two) times daily. 60 tablet 11  . doxycycline (VIBRA-TABS) 100 MG tablet Take 1 tablet (100 mg total) by mouth 2 (two) times daily. 20 tablet 0  . fluconazole (DIFLUCAN) 150 MG tablet Take 1 tablet (150 mg total) by mouth daily. 10 tablet 1  . gabapentin (NEURONTIN) 100 MG capsule Take 1 capsule (100 mg total) by mouth at bedtime. 30 capsule 3  . LEVEMIR 100 UNIT/ML injection Inject 74 Units into the skin.     . metoprolol tartrate (LOPRESSOR) 50 MG tablet Take 75 mg by mouth 2 (two) times daily.   0  . naproxen sodium (ALEVE) 220 MG tablet Take 220 mg by mouth daily.    Marland Kitchen NOVOLOG FLEXPEN 100 UNIT/ML FlexPen  inject 10 units SUBCUTANEOUSLY WITH MAIN MEAL  0  . potassium chloride SA (K-DUR,KLOR-CON) 20 MEQ tablet Take 20 mEq by mouth daily.   0  . rosuvastatin (CRESTOR) 20 MG tablet   0  . sertraline (ZOLOFT) 100 MG tablet     . silver nitrate applicators 52-84 % applicator Apply to wound every other day then cover with guaze and papertape 100 each 0  . sulfamethoxazole-trimethoprim (BACTRIM) 400-80 MG tablet Take 1 tablet by mouth 2 (two) times daily. 28 tablet 0   No current facility-administered medications on file prior to visit.    Allergies  Allergen Reactions  . Levofloxacin Other (See Comments)    confusion    Objective: There were no vitals filed for this visit.  General: No acute distress, AAOx3  Right posterior heel/leg, Wound/ dehiscence at surgical site centrally that measures 3.3 x 0.9 x0.3cm with tendon exposure and increased granular buds at wound edges but central fibrotic base with tendon fibers that are yellow in nature, No warmth, mild bloody drainage, mild swelling to right posterior heel/leg, no other signs of infection noted to this area  Right submet 1partial thickness ulceration that measures 0.5x 1x 0.3 cm does not probe to bone with macerated tissue and much improved cellulitis, no warmth no purulent drainage no malodor no other signs of infection.  Right  fourth toe partial thickness ulceration that measures 0.3 x 0.3 x 0.1 cm does not probe to bone with a granular base and mild surrounding blanchable erythema to the distal tuft, no warmth, no drainage, no malodor.  Left second toe distal tuft appears to be prematurely healed, no cellulitis.    Preulcerative calluses noted submet 1 on left with no signs of infection  Preulcerative callus submet 5 on left and heel that remains healed with no re-opening.  Capillary fill time <3 seconds in all digits, gross sensation present via light touch bilateral + mild pain to right foot.  No pain with calf  compression.  Assessment and Plan:  Problem List Items Addressed This Visit      Endocrine   Diabetic polyneuropathy associated with type 2 diabetes mellitus ([place])     Nervous and Auditory   Dementia ([place])    Other Visit Diagnoses    Leg ulcer, right, with fat layer exposed ([place])    -  Primary   Ulcer of foot, limited to breakdown of skin, unspecified laterality ([place])       S/P foot surgery, right          -Patient seen and evaluated -To all wounds very minimal debridement performed today using a saline moistened gauze and tissue nipper and dermal curette to patient's tolerance  -Applied SurgiEx to all wounds except right submet 1 and applied Maxorb and protective Band-Aid dressings.  To the right posterior heel applied Oasis trilayer matrix 3x3.5cm lot [id number], application #3 secured with adaptic and steristrips and 4x4 guaze and abd, and [place] -Home nurse orders sent for wound care as above -Order placed for wheelchair -Return to office in 1-2 weeks or sooner if problems arise.   Landis Martins, DPM

## 2019-11-30 ENCOUNTER — Telehealth: Payer: Self-pay

## 2019-11-30 NOTE — Telephone Encounter (Signed)
Contacted [facility] back to give approval for PT, LVM stating approval.

## 2019-11-30 NOTE — Telephone Encounter (Signed)
Yes PT can see her

## 2019-11-30 NOTE — Telephone Encounter (Signed)
Cindy Hahn from [facility] called and LVM stating they had received a script for a wheelchair. Cindy Hahn states Dr's note is not sufficient where insurance would cover for the DM equipment. She also stated it would be best if PT does and eval and writes the Rx up.  Cindy Hahn would like to know if she can have a verbal order for PT eval? Please advice

## 2019-12-01 DIAGNOSIS — N183 Chronic kidney disease, stage 3 unspecified: Secondary | ICD-10-CM | POA: Diagnosis not present

## 2019-12-01 DIAGNOSIS — L97501 Non-pressure chronic ulcer of other part of unspecified foot limited to breakdown of skin: Secondary | ICD-10-CM | POA: Diagnosis not present

## 2019-12-01 DIAGNOSIS — I129 Hypertensive chronic kidney disease with stage 1 through stage 4 chronic kidney disease, or unspecified chronic kidney disease: Secondary | ICD-10-CM | POA: Diagnosis not present

## 2019-12-01 DIAGNOSIS — L97912 Non-pressure chronic ulcer of unspecified part of right lower leg with fat layer exposed: Secondary | ICD-10-CM | POA: Diagnosis not present

## 2019-12-01 DIAGNOSIS — F0390 Unspecified dementia without behavioral disturbance: Secondary | ICD-10-CM | POA: Diagnosis not present

## 2019-12-01 DIAGNOSIS — L97511 Non-pressure chronic ulcer of other part of right foot limited to breakdown of skin: Secondary | ICD-10-CM | POA: Diagnosis not present

## 2019-12-08 ENCOUNTER — Encounter: Payer: Self-pay | Admitting: Sports Medicine

## 2019-12-08 ENCOUNTER — Telehealth: Payer: Self-pay

## 2019-12-08 DIAGNOSIS — E119 Type 2 diabetes mellitus without complications: Secondary | ICD-10-CM | POA: Diagnosis not present

## 2019-12-08 DIAGNOSIS — E11621 Type 2 diabetes mellitus with foot ulcer: Secondary | ICD-10-CM | POA: Diagnosis not present

## 2019-12-08 NOTE — Telephone Encounter (Signed)
Pt's daughter Cecille Rubin dixon) called and LVM at the nurse line stating her mother has been acting a little off. Pt has also been having irregular BS readings as low as 42 and high 500.  Cecille Rubin would like an order to get lab work and urine

## 2019-12-08 NOTE — Telephone Encounter (Signed)
Contacted [facility] and gave orders for labs and urnialysis. [facility] stated they will do labs until Wednesday

## 2019-12-08 NOTE — Telephone Encounter (Signed)
Contact [facility] to draw CBC, BMP, ESR, CRP, and Urinalysis  -Dr. Chauncey Cruel

## 2019-12-12 ENCOUNTER — Ambulatory Visit: Payer: Medicare Other | Admitting: Sports Medicine

## 2019-12-13 DIAGNOSIS — I129 Hypertensive chronic kidney disease with stage 1 through stage 4 chronic kidney disease, or unspecified chronic kidney disease: Secondary | ICD-10-CM | POA: Diagnosis not present

## 2019-12-13 DIAGNOSIS — L97511 Non-pressure chronic ulcer of other part of right foot limited to breakdown of skin: Secondary | ICD-10-CM | POA: Diagnosis not present

## 2019-12-13 DIAGNOSIS — F0390 Unspecified dementia without behavioral disturbance: Secondary | ICD-10-CM | POA: Diagnosis not present

## 2019-12-13 DIAGNOSIS — N183 Chronic kidney disease, stage 3 unspecified: Secondary | ICD-10-CM | POA: Diagnosis not present

## 2019-12-13 DIAGNOSIS — L97912 Non-pressure chronic ulcer of unspecified part of right lower leg with fat layer exposed: Secondary | ICD-10-CM | POA: Diagnosis not present

## 2019-12-13 DIAGNOSIS — L97501 Non-pressure chronic ulcer of other part of unspecified foot limited to breakdown of skin: Secondary | ICD-10-CM | POA: Diagnosis not present

## 2019-12-14 DIAGNOSIS — E113411 Type 2 diabetes mellitus with severe nonproliferative diabetic retinopathy with macular edema, right eye: Secondary | ICD-10-CM | POA: Diagnosis not present

## 2019-12-14 DIAGNOSIS — E113412 Type 2 diabetes mellitus with severe nonproliferative diabetic retinopathy with macular edema, left eye: Secondary | ICD-10-CM | POA: Diagnosis not present

## 2019-12-18 DIAGNOSIS — S2249XD Multiple fractures of ribs, unspecified side, subsequent encounter for fracture with routine healing: Secondary | ICD-10-CM | POA: Diagnosis not present

## 2019-12-18 DIAGNOSIS — R41841 Cognitive communication deficit: Secondary | ICD-10-CM | POA: Diagnosis not present

## 2019-12-18 DIAGNOSIS — E875 Hyperkalemia: Secondary | ICD-10-CM | POA: Diagnosis not present

## 2019-12-18 DIAGNOSIS — R2689 Other abnormalities of gait and mobility: Secondary | ICD-10-CM | POA: Diagnosis not present

## 2019-12-18 DIAGNOSIS — B9620 Unspecified Escherichia coli [E. coli] as the cause of diseases classified elsewhere: Secondary | ICD-10-CM | POA: Diagnosis present

## 2019-12-18 DIAGNOSIS — F0390 Unspecified dementia without behavioral disturbance: Secondary | ICD-10-CM | POA: Diagnosis present

## 2019-12-18 DIAGNOSIS — Z87440 Personal history of urinary (tract) infections: Secondary | ICD-10-CM | POA: Diagnosis not present

## 2019-12-18 DIAGNOSIS — I2510 Atherosclerotic heart disease of native coronary artery without angina pectoris: Secondary | ICD-10-CM | POA: Diagnosis not present

## 2019-12-18 DIAGNOSIS — Z743 Need for continuous supervision: Secondary | ICD-10-CM | POA: Diagnosis not present

## 2019-12-18 DIAGNOSIS — R4182 Altered mental status, unspecified: Secondary | ICD-10-CM | POA: Diagnosis not present

## 2019-12-18 DIAGNOSIS — F339 Major depressive disorder, recurrent, unspecified: Secondary | ICD-10-CM | POA: Diagnosis not present

## 2019-12-18 DIAGNOSIS — I129 Hypertensive chronic kidney disease with stage 1 through stage 4 chronic kidney disease, or unspecified chronic kidney disease: Secondary | ICD-10-CM | POA: Diagnosis not present

## 2019-12-18 DIAGNOSIS — R1011 Right upper quadrant pain: Secondary | ICD-10-CM | POA: Diagnosis not present

## 2019-12-18 DIAGNOSIS — R0902 Hypoxemia: Secondary | ICD-10-CM | POA: Diagnosis not present

## 2019-12-18 DIAGNOSIS — N189 Chronic kidney disease, unspecified: Secondary | ICD-10-CM | POA: Diagnosis present

## 2019-12-18 DIAGNOSIS — R410 Disorientation, unspecified: Secondary | ICD-10-CM | POA: Diagnosis not present

## 2019-12-18 DIAGNOSIS — Z79811 Long term (current) use of aromatase inhibitors: Secondary | ICD-10-CM | POA: Diagnosis not present

## 2019-12-18 DIAGNOSIS — K859 Acute pancreatitis without necrosis or infection, unspecified: Secondary | ICD-10-CM | POA: Diagnosis not present

## 2019-12-18 DIAGNOSIS — A419 Sepsis, unspecified organism: Secondary | ICD-10-CM | POA: Diagnosis not present

## 2019-12-18 DIAGNOSIS — I10 Essential (primary) hypertension: Secondary | ICD-10-CM | POA: Diagnosis not present

## 2019-12-18 DIAGNOSIS — E1122 Type 2 diabetes mellitus with diabetic chronic kidney disease: Secondary | ICD-10-CM | POA: Diagnosis present

## 2019-12-18 DIAGNOSIS — S37009D Unspecified injury of unspecified kidney, subsequent encounter: Secondary | ICD-10-CM | POA: Diagnosis not present

## 2019-12-18 DIAGNOSIS — R279 Unspecified lack of coordination: Secondary | ICD-10-CM | POA: Diagnosis not present

## 2019-12-18 DIAGNOSIS — F419 Anxiety disorder, unspecified: Secondary | ICD-10-CM | POA: Diagnosis present

## 2019-12-18 DIAGNOSIS — R1084 Generalized abdominal pain: Secondary | ICD-10-CM | POA: Diagnosis not present

## 2019-12-18 DIAGNOSIS — E119 Type 2 diabetes mellitus without complications: Secondary | ICD-10-CM | POA: Diagnosis present

## 2019-12-18 DIAGNOSIS — C50911 Malignant neoplasm of unspecified site of right female breast: Secondary | ICD-10-CM | POA: Diagnosis present

## 2019-12-18 DIAGNOSIS — N3000 Acute cystitis without hematuria: Secondary | ICD-10-CM | POA: Diagnosis not present

## 2019-12-18 DIAGNOSIS — F329 Major depressive disorder, single episode, unspecified: Secondary | ICD-10-CM | POA: Diagnosis present

## 2019-12-18 DIAGNOSIS — I482 Chronic atrial fibrillation, unspecified: Secondary | ICD-10-CM | POA: Diagnosis not present

## 2019-12-18 DIAGNOSIS — N170 Acute kidney failure with tubular necrosis: Secondary | ICD-10-CM | POA: Diagnosis not present

## 2019-12-18 DIAGNOSIS — M1380 Other specified arthritis, unspecified site: Secondary | ICD-10-CM | POA: Diagnosis not present

## 2019-12-18 DIAGNOSIS — Z7982 Long term (current) use of aspirin: Secondary | ICD-10-CM | POA: Diagnosis not present

## 2019-12-18 DIAGNOSIS — R52 Pain, unspecified: Secondary | ICD-10-CM | POA: Diagnosis not present

## 2019-12-18 DIAGNOSIS — S2242XA Multiple fractures of ribs, left side, initial encounter for closed fracture: Secondary | ICD-10-CM | POA: Diagnosis not present

## 2019-12-18 DIAGNOSIS — N390 Urinary tract infection, site not specified: Secondary | ICD-10-CM | POA: Diagnosis not present

## 2019-12-18 DIAGNOSIS — I959 Hypotension, unspecified: Secondary | ICD-10-CM | POA: Diagnosis present

## 2019-12-18 DIAGNOSIS — Z8673 Personal history of transient ischemic attack (TIA), and cerebral infarction without residual deficits: Secondary | ICD-10-CM | POA: Diagnosis not present

## 2019-12-18 DIAGNOSIS — I4891 Unspecified atrial fibrillation: Secondary | ICD-10-CM | POA: Diagnosis not present

## 2019-12-18 DIAGNOSIS — M6281 Muscle weakness (generalized): Secondary | ICD-10-CM | POA: Diagnosis not present

## 2019-12-18 DIAGNOSIS — M1990 Unspecified osteoarthritis, unspecified site: Secondary | ICD-10-CM | POA: Diagnosis present

## 2019-12-18 DIAGNOSIS — E785 Hyperlipidemia, unspecified: Secondary | ICD-10-CM | POA: Diagnosis not present

## 2019-12-18 DIAGNOSIS — Z950 Presence of cardiac pacemaker: Secondary | ICD-10-CM | POA: Diagnosis not present

## 2019-12-19 ENCOUNTER — Ambulatory Visit: Payer: Medicare Other | Admitting: Sports Medicine

## 2019-12-22 DIAGNOSIS — S2242XA Multiple fractures of ribs, left side, initial encounter for closed fracture: Secondary | ICD-10-CM | POA: Diagnosis not present

## 2019-12-22 DIAGNOSIS — F413 Other mixed anxiety disorders: Secondary | ICD-10-CM | POA: Diagnosis not present

## 2019-12-22 DIAGNOSIS — M1990 Unspecified osteoarthritis, unspecified site: Secondary | ICD-10-CM | POA: Diagnosis not present

## 2019-12-22 DIAGNOSIS — W19XXXA Unspecified fall, initial encounter: Secondary | ICD-10-CM | POA: Diagnosis not present

## 2019-12-22 DIAGNOSIS — L97412 Non-pressure chronic ulcer of right heel and midfoot with fat layer exposed: Secondary | ICD-10-CM | POA: Diagnosis not present

## 2019-12-22 DIAGNOSIS — R2689 Other abnormalities of gait and mobility: Secondary | ICD-10-CM | POA: Diagnosis not present

## 2019-12-22 DIAGNOSIS — F32A Depression, unspecified: Secondary | ICD-10-CM | POA: Diagnosis present

## 2019-12-22 DIAGNOSIS — S0003XA Contusion of scalp, initial encounter: Secondary | ICD-10-CM | POA: Diagnosis not present

## 2019-12-22 DIAGNOSIS — D649 Anemia, unspecified: Secondary | ICD-10-CM | POA: Diagnosis not present

## 2019-12-22 DIAGNOSIS — G928 Other toxic encephalopathy: Secondary | ICD-10-CM | POA: Diagnosis not present

## 2019-12-22 DIAGNOSIS — M6281 Muscle weakness (generalized): Secondary | ICD-10-CM | POA: Diagnosis not present

## 2019-12-22 DIAGNOSIS — S0993XA Unspecified injury of face, initial encounter: Secondary | ICD-10-CM | POA: Diagnosis not present

## 2019-12-22 DIAGNOSIS — R531 Weakness: Secondary | ICD-10-CM | POA: Diagnosis not present

## 2019-12-22 DIAGNOSIS — L97419 Non-pressure chronic ulcer of right heel and midfoot with unspecified severity: Secondary | ICD-10-CM | POA: Diagnosis present

## 2019-12-22 DIAGNOSIS — N3090 Cystitis, unspecified without hematuria: Secondary | ICD-10-CM | POA: Diagnosis not present

## 2019-12-22 DIAGNOSIS — H5509 Other forms of nystagmus: Secondary | ICD-10-CM | POA: Diagnosis present

## 2019-12-22 DIAGNOSIS — R69 Illness, unspecified: Secondary | ICD-10-CM | POA: Diagnosis not present

## 2019-12-22 DIAGNOSIS — S2249XD Multiple fractures of ribs, unspecified side, subsequent encounter for fracture with routine healing: Secondary | ICD-10-CM | POA: Diagnosis not present

## 2019-12-22 DIAGNOSIS — N183 Chronic kidney disease, stage 3 unspecified: Secondary | ICD-10-CM | POA: Diagnosis not present

## 2019-12-22 DIAGNOSIS — M1380 Other specified arthritis, unspecified site: Secondary | ICD-10-CM | POA: Diagnosis not present

## 2019-12-22 DIAGNOSIS — S37009D Unspecified injury of unspecified kidney, subsequent encounter: Secondary | ICD-10-CM | POA: Diagnosis not present

## 2019-12-22 DIAGNOSIS — R41841 Cognitive communication deficit: Secondary | ICD-10-CM | POA: Diagnosis not present

## 2019-12-22 DIAGNOSIS — J90 Pleural effusion, not elsewhere classified: Secondary | ICD-10-CM | POA: Diagnosis not present

## 2019-12-22 DIAGNOSIS — L97519 Non-pressure chronic ulcer of other part of right foot with unspecified severity: Secondary | ICD-10-CM | POA: Diagnosis present

## 2019-12-22 DIAGNOSIS — L03116 Cellulitis of left lower limb: Secondary | ICD-10-CM | POA: Diagnosis not present

## 2019-12-22 DIAGNOSIS — S065X0A Traumatic subdural hemorrhage without loss of consciousness, initial encounter: Secondary | ICD-10-CM | POA: Diagnosis not present

## 2019-12-22 DIAGNOSIS — Z8673 Personal history of transient ischemic attack (TIA), and cerebral infarction without residual deficits: Secondary | ICD-10-CM | POA: Diagnosis not present

## 2019-12-22 DIAGNOSIS — F0390 Unspecified dementia without behavioral disturbance: Secondary | ICD-10-CM | POA: Diagnosis not present

## 2019-12-22 DIAGNOSIS — R4182 Altered mental status, unspecified: Secondary | ICD-10-CM | POA: Diagnosis not present

## 2019-12-22 DIAGNOSIS — Y998 Other external cause status: Secondary | ICD-10-CM | POA: Diagnosis not present

## 2019-12-22 DIAGNOSIS — I2510 Atherosclerotic heart disease of native coronary artery without angina pectoris: Secondary | ICD-10-CM | POA: Diagnosis not present

## 2019-12-22 DIAGNOSIS — R54 Age-related physical debility: Secondary | ICD-10-CM | POA: Diagnosis not present

## 2019-12-22 DIAGNOSIS — R279 Unspecified lack of coordination: Secondary | ICD-10-CM | POA: Diagnosis not present

## 2019-12-22 DIAGNOSIS — R42 Dizziness and giddiness: Secondary | ICD-10-CM | POA: Diagnosis present

## 2019-12-22 DIAGNOSIS — Z20822 Contact with and (suspected) exposure to covid-19: Secondary | ICD-10-CM | POA: Diagnosis not present

## 2019-12-22 DIAGNOSIS — N390 Urinary tract infection, site not specified: Secondary | ICD-10-CM | POA: Diagnosis not present

## 2019-12-22 DIAGNOSIS — I10 Essential (primary) hypertension: Secondary | ICD-10-CM | POA: Diagnosis not present

## 2019-12-22 DIAGNOSIS — I129 Hypertensive chronic kidney disease with stage 1 through stage 4 chronic kidney disease, or unspecified chronic kidney disease: Secondary | ICD-10-CM | POA: Diagnosis not present

## 2019-12-22 DIAGNOSIS — I482 Chronic atrial fibrillation, unspecified: Secondary | ICD-10-CM | POA: Diagnosis not present

## 2019-12-22 DIAGNOSIS — F338 Other recurrent depressive disorders: Secondary | ICD-10-CM | POA: Diagnosis not present

## 2019-12-22 DIAGNOSIS — I480 Paroxysmal atrial fibrillation: Secondary | ICD-10-CM | POA: Diagnosis not present

## 2019-12-22 DIAGNOSIS — N3000 Acute cystitis without hematuria: Secondary | ICD-10-CM | POA: Diagnosis not present

## 2019-12-22 DIAGNOSIS — F418 Other specified anxiety disorders: Secondary | ICD-10-CM | POA: Diagnosis not present

## 2019-12-22 DIAGNOSIS — W1839XA Other fall on same level, initial encounter: Secondary | ICD-10-CM | POA: Diagnosis not present

## 2019-12-22 DIAGNOSIS — E875 Hyperkalemia: Secondary | ICD-10-CM | POA: Diagnosis not present

## 2019-12-22 DIAGNOSIS — E1165 Type 2 diabetes mellitus with hyperglycemia: Secondary | ICD-10-CM | POA: Diagnosis not present

## 2019-12-22 DIAGNOSIS — S0990XA Unspecified injury of head, initial encounter: Secondary | ICD-10-CM | POA: Diagnosis not present

## 2019-12-22 DIAGNOSIS — I69328 Other speech and language deficits following cerebral infarction: Secondary | ICD-10-CM | POA: Diagnosis not present

## 2019-12-22 DIAGNOSIS — M25551 Pain in right hip: Secondary | ICD-10-CM | POA: Diagnosis not present

## 2019-12-22 DIAGNOSIS — G4733 Obstructive sleep apnea (adult) (pediatric): Secondary | ICD-10-CM | POA: Diagnosis present

## 2019-12-22 DIAGNOSIS — R4020 Unspecified coma: Secondary | ICD-10-CM | POA: Diagnosis not present

## 2019-12-22 DIAGNOSIS — I472 Ventricular tachycardia: Secondary | ICD-10-CM | POA: Diagnosis not present

## 2019-12-22 DIAGNOSIS — L97501 Non-pressure chronic ulcer of other part of unspecified foot limited to breakdown of skin: Secondary | ICD-10-CM | POA: Diagnosis not present

## 2019-12-22 DIAGNOSIS — I739 Peripheral vascular disease, unspecified: Secondary | ICD-10-CM | POA: Diagnosis not present

## 2019-12-22 DIAGNOSIS — N189 Chronic kidney disease, unspecified: Secondary | ICD-10-CM | POA: Diagnosis not present

## 2019-12-22 DIAGNOSIS — F419 Anxiety disorder, unspecified: Secondary | ICD-10-CM | POA: Diagnosis present

## 2019-12-22 DIAGNOSIS — A419 Sepsis, unspecified organism: Secondary | ICD-10-CM | POA: Diagnosis not present

## 2019-12-22 DIAGNOSIS — N179 Acute kidney failure, unspecified: Secondary | ICD-10-CM | POA: Diagnosis not present

## 2019-12-22 DIAGNOSIS — R296 Repeated falls: Secondary | ICD-10-CM | POA: Diagnosis not present

## 2019-12-22 DIAGNOSIS — C642 Malignant neoplasm of left kidney, except renal pelvis: Secondary | ICD-10-CM | POA: Diagnosis present

## 2019-12-22 DIAGNOSIS — N170 Acute kidney failure with tubular necrosis: Secondary | ICD-10-CM | POA: Diagnosis not present

## 2019-12-22 DIAGNOSIS — E11621 Type 2 diabetes mellitus with foot ulcer: Secondary | ICD-10-CM | POA: Diagnosis present

## 2019-12-22 DIAGNOSIS — Z794 Long term (current) use of insulin: Secondary | ICD-10-CM | POA: Diagnosis not present

## 2019-12-22 DIAGNOSIS — G309 Alzheimer's disease, unspecified: Secondary | ICD-10-CM | POA: Diagnosis present

## 2019-12-22 DIAGNOSIS — F22 Delusional disorders: Secondary | ICD-10-CM | POA: Diagnosis not present

## 2019-12-22 DIAGNOSIS — G9341 Metabolic encephalopathy: Secondary | ICD-10-CM | POA: Diagnosis present

## 2019-12-22 DIAGNOSIS — Z743 Need for continuous supervision: Secondary | ICD-10-CM | POA: Diagnosis not present

## 2019-12-22 DIAGNOSIS — N1831 Chronic kidney disease, stage 3a: Secondary | ICD-10-CM | POA: Diagnosis not present

## 2019-12-22 DIAGNOSIS — L97529 Non-pressure chronic ulcer of other part of left foot with unspecified severity: Secondary | ICD-10-CM | POA: Diagnosis present

## 2019-12-22 DIAGNOSIS — D631 Anemia in chronic kidney disease: Secondary | ICD-10-CM | POA: Diagnosis not present

## 2019-12-22 DIAGNOSIS — Z7982 Long term (current) use of aspirin: Secondary | ICD-10-CM | POA: Diagnosis not present

## 2019-12-22 DIAGNOSIS — S0083XA Contusion of other part of head, initial encounter: Secondary | ICD-10-CM | POA: Diagnosis not present

## 2019-12-22 DIAGNOSIS — I4891 Unspecified atrial fibrillation: Secondary | ICD-10-CM | POA: Diagnosis not present

## 2019-12-22 DIAGNOSIS — M791 Myalgia, unspecified site: Secondary | ICD-10-CM | POA: Diagnosis not present

## 2019-12-22 DIAGNOSIS — E1142 Type 2 diabetes mellitus with diabetic polyneuropathy: Secondary | ICD-10-CM | POA: Diagnosis present

## 2019-12-22 DIAGNOSIS — Z20828 Contact with and (suspected) exposure to other viral communicable diseases: Secondary | ICD-10-CM | POA: Diagnosis not present

## 2019-12-22 DIAGNOSIS — F339 Major depressive disorder, recurrent, unspecified: Secondary | ICD-10-CM | POA: Diagnosis not present

## 2019-12-22 DIAGNOSIS — E46 Unspecified protein-calorie malnutrition: Secondary | ICD-10-CM | POA: Diagnosis not present

## 2019-12-22 DIAGNOSIS — B3741 Candidal cystitis and urethritis: Secondary | ICD-10-CM | POA: Diagnosis present

## 2019-12-22 DIAGNOSIS — R262 Difficulty in walking, not elsewhere classified: Secondary | ICD-10-CM | POA: Diagnosis not present

## 2019-12-22 DIAGNOSIS — E119 Type 2 diabetes mellitus without complications: Secondary | ICD-10-CM | POA: Diagnosis not present

## 2019-12-22 DIAGNOSIS — R5381 Other malaise: Secondary | ICD-10-CM | POA: Diagnosis not present

## 2019-12-22 DIAGNOSIS — L97912 Non-pressure chronic ulcer of unspecified part of right lower leg with fat layer exposed: Secondary | ICD-10-CM | POA: Diagnosis not present

## 2019-12-22 DIAGNOSIS — L97511 Non-pressure chronic ulcer of other part of right foot limited to breakdown of skin: Secondary | ICD-10-CM | POA: Diagnosis not present

## 2019-12-22 DIAGNOSIS — Z853 Personal history of malignant neoplasm of breast: Secondary | ICD-10-CM | POA: Diagnosis not present

## 2019-12-22 DIAGNOSIS — X58XXXD Exposure to other specified factors, subsequent encounter: Secondary | ICD-10-CM | POA: Diagnosis not present

## 2019-12-22 DIAGNOSIS — R404 Transient alteration of awareness: Secondary | ICD-10-CM | POA: Diagnosis not present

## 2019-12-22 DIAGNOSIS — E785 Hyperlipidemia, unspecified: Secondary | ICD-10-CM | POA: Diagnosis present

## 2019-12-22 DIAGNOSIS — Z932 Ileostomy status: Secondary | ICD-10-CM | POA: Diagnosis not present

## 2019-12-22 DIAGNOSIS — Z79899 Other long term (current) drug therapy: Secondary | ICD-10-CM | POA: Diagnosis not present

## 2019-12-22 DIAGNOSIS — M79605 Pain in left leg: Secondary | ICD-10-CM | POA: Diagnosis not present

## 2019-12-26 ENCOUNTER — Ambulatory Visit: Payer: Medicare Other | Admitting: Sports Medicine

## 2019-12-26 DIAGNOSIS — D649 Anemia, unspecified: Secondary | ICD-10-CM | POA: Diagnosis not present

## 2019-12-26 DIAGNOSIS — N390 Urinary tract infection, site not specified: Secondary | ICD-10-CM | POA: Diagnosis not present

## 2019-12-26 DIAGNOSIS — N1831 Chronic kidney disease, stage 3a: Secondary | ICD-10-CM | POA: Diagnosis not present

## 2019-12-26 DIAGNOSIS — L97412 Non-pressure chronic ulcer of right heel and midfoot with fat layer exposed: Secondary | ICD-10-CM | POA: Diagnosis not present

## 2019-12-26 DIAGNOSIS — R262 Difficulty in walking, not elsewhere classified: Secondary | ICD-10-CM | POA: Diagnosis not present

## 2019-12-27 DIAGNOSIS — N183 Chronic kidney disease, stage 3 unspecified: Secondary | ICD-10-CM | POA: Diagnosis not present

## 2019-12-27 DIAGNOSIS — L97511 Non-pressure chronic ulcer of other part of right foot limited to breakdown of skin: Secondary | ICD-10-CM | POA: Diagnosis not present

## 2019-12-27 DIAGNOSIS — L97501 Non-pressure chronic ulcer of other part of unspecified foot limited to breakdown of skin: Secondary | ICD-10-CM | POA: Diagnosis not present

## 2019-12-27 DIAGNOSIS — L97912 Non-pressure chronic ulcer of unspecified part of right lower leg with fat layer exposed: Secondary | ICD-10-CM | POA: Diagnosis not present

## 2019-12-27 DIAGNOSIS — I129 Hypertensive chronic kidney disease with stage 1 through stage 4 chronic kidney disease, or unspecified chronic kidney disease: Secondary | ICD-10-CM | POA: Diagnosis not present

## 2019-12-27 DIAGNOSIS — F0390 Unspecified dementia without behavioral disturbance: Secondary | ICD-10-CM | POA: Diagnosis not present

## 2020-01-09 DIAGNOSIS — L97412 Non-pressure chronic ulcer of right heel and midfoot with fat layer exposed: Secondary | ICD-10-CM | POA: Diagnosis not present

## 2020-01-09 DIAGNOSIS — L03116 Cellulitis of left lower limb: Secondary | ICD-10-CM | POA: Diagnosis not present

## 2020-01-11 DIAGNOSIS — F0390 Unspecified dementia without behavioral disturbance: Secondary | ICD-10-CM | POA: Diagnosis not present

## 2020-01-11 DIAGNOSIS — F22 Delusional disorders: Secondary | ICD-10-CM | POA: Diagnosis not present

## 2020-01-11 DIAGNOSIS — F413 Other mixed anxiety disorders: Secondary | ICD-10-CM | POA: Diagnosis not present

## 2020-01-11 DIAGNOSIS — F338 Other recurrent depressive disorders: Secondary | ICD-10-CM | POA: Diagnosis not present

## 2020-01-16 DIAGNOSIS — L97412 Non-pressure chronic ulcer of right heel and midfoot with fat layer exposed: Secondary | ICD-10-CM | POA: Diagnosis not present

## 2020-01-16 DIAGNOSIS — L03116 Cellulitis of left lower limb: Secondary | ICD-10-CM | POA: Diagnosis not present

## 2020-01-17 ENCOUNTER — Other Ambulatory Visit: Payer: Self-pay | Admitting: *Deleted

## 2020-01-17 ENCOUNTER — Other Ambulatory Visit: Payer: Self-pay | Admitting: Cardiology

## 2020-01-17 NOTE — Patient Outreach (Signed)
Member screened for potential [facility] Care Management needs as a benefit of [place] Medicare.  Per Patient Cindy Hahn member resides in [facility].   Communication received from facility SW indicating [name] transition plan is to return home with daughter.  Will continue to follow and plan outreach closer to SNF discharge.    Marthenia Rolling, MSN-Ed, RN,BSN [place] Acute Care Coordinator [phone] [facility]) [phone]  (Toll free office)

## 2020-01-19 ENCOUNTER — Telehealth: Payer: Self-pay | Admitting: *Deleted

## 2020-01-19 NOTE — Telephone Encounter (Signed)
Cindy Hahn called from [facility] and sent pictures and the wounds were red and draining and the new wound had a water blister that popped and there was a culture done and per Dr Cannon Kettle needs to use heel pillows every day and to use the medi-honey once daily and change the dressing once daily as well and called the nursing home back and spoke with Cindy Hahn and relayed the message per Dr Cannon Kettle and stated to call the office if any concerns or questions. Lattie Haw

## 2020-01-23 DIAGNOSIS — L97412 Non-pressure chronic ulcer of right heel and midfoot with fat layer exposed: Secondary | ICD-10-CM | POA: Diagnosis not present

## 2020-01-23 DIAGNOSIS — L03116 Cellulitis of left lower limb: Secondary | ICD-10-CM | POA: Diagnosis not present

## 2020-01-25 DIAGNOSIS — F0390 Unspecified dementia without behavioral disturbance: Secondary | ICD-10-CM | POA: Diagnosis not present

## 2020-01-25 DIAGNOSIS — F22 Delusional disorders: Secondary | ICD-10-CM | POA: Diagnosis not present

## 2020-01-25 DIAGNOSIS — F413 Other mixed anxiety disorders: Secondary | ICD-10-CM | POA: Diagnosis not present

## 2020-01-25 DIAGNOSIS — F338 Other recurrent depressive disorders: Secondary | ICD-10-CM | POA: Diagnosis not present

## 2020-01-29 DIAGNOSIS — S0993XA Unspecified injury of face, initial encounter: Secondary | ICD-10-CM | POA: Diagnosis not present

## 2020-01-29 DIAGNOSIS — J90 Pleural effusion, not elsewhere classified: Secondary | ICD-10-CM | POA: Diagnosis not present

## 2020-01-29 DIAGNOSIS — I482 Chronic atrial fibrillation, unspecified: Secondary | ICD-10-CM | POA: Diagnosis not present

## 2020-01-29 DIAGNOSIS — N390 Urinary tract infection, site not specified: Secondary | ICD-10-CM | POA: Diagnosis not present

## 2020-01-29 DIAGNOSIS — Y998 Other external cause status: Secondary | ICD-10-CM | POA: Diagnosis not present

## 2020-01-29 DIAGNOSIS — S0003XA Contusion of scalp, initial encounter: Secondary | ICD-10-CM | POA: Diagnosis not present

## 2020-01-29 DIAGNOSIS — N170 Acute kidney failure with tubular necrosis: Secondary | ICD-10-CM | POA: Diagnosis not present

## 2020-01-29 DIAGNOSIS — M791 Myalgia, unspecified site: Secondary | ICD-10-CM | POA: Diagnosis not present

## 2020-01-29 DIAGNOSIS — M79605 Pain in left leg: Secondary | ICD-10-CM | POA: Diagnosis not present

## 2020-01-29 DIAGNOSIS — N3000 Acute cystitis without hematuria: Secondary | ICD-10-CM | POA: Diagnosis not present

## 2020-01-29 DIAGNOSIS — S0990XA Unspecified injury of head, initial encounter: Secondary | ICD-10-CM | POA: Diagnosis not present

## 2020-01-29 DIAGNOSIS — S0083XA Contusion of other part of head, initial encounter: Secondary | ICD-10-CM | POA: Diagnosis not present

## 2020-01-29 DIAGNOSIS — S065X0A Traumatic subdural hemorrhage without loss of consciousness, initial encounter: Secondary | ICD-10-CM | POA: Diagnosis not present

## 2020-01-29 DIAGNOSIS — Z20822 Contact with and (suspected) exposure to covid-19: Secondary | ICD-10-CM | POA: Diagnosis not present

## 2020-01-29 DIAGNOSIS — I480 Paroxysmal atrial fibrillation: Secondary | ICD-10-CM | POA: Diagnosis not present

## 2020-01-29 DIAGNOSIS — I10 Essential (primary) hypertension: Secondary | ICD-10-CM | POA: Diagnosis not present

## 2020-01-29 DIAGNOSIS — G9341 Metabolic encephalopathy: Secondary | ICD-10-CM | POA: Diagnosis not present

## 2020-01-29 DIAGNOSIS — I4891 Unspecified atrial fibrillation: Secondary | ICD-10-CM | POA: Diagnosis not present

## 2020-01-29 DIAGNOSIS — N3090 Cystitis, unspecified without hematuria: Secondary | ICD-10-CM | POA: Diagnosis not present

## 2020-01-29 DIAGNOSIS — R4182 Altered mental status, unspecified: Secondary | ICD-10-CM | POA: Diagnosis not present

## 2020-01-29 DIAGNOSIS — W1839XA Other fall on same level, initial encounter: Secondary | ICD-10-CM | POA: Diagnosis not present

## 2020-02-01 ENCOUNTER — Other Ambulatory Visit: Payer: Self-pay | Admitting: *Deleted

## 2020-02-01 DIAGNOSIS — Z7401 Bed confinement status: Secondary | ICD-10-CM | POA: Diagnosis not present

## 2020-02-01 DIAGNOSIS — C642 Malignant neoplasm of left kidney, except renal pelvis: Secondary | ICD-10-CM | POA: Diagnosis present

## 2020-02-01 DIAGNOSIS — I672 Cerebral atherosclerosis: Secondary | ICD-10-CM | POA: Diagnosis not present

## 2020-02-01 DIAGNOSIS — B3741 Candidal cystitis and urethritis: Secondary | ICD-10-CM | POA: Diagnosis present

## 2020-02-01 DIAGNOSIS — G309 Alzheimer's disease, unspecified: Secondary | ICD-10-CM | POA: Diagnosis present

## 2020-02-01 DIAGNOSIS — K314 Gastric diverticulum: Secondary | ICD-10-CM | POA: Diagnosis not present

## 2020-02-01 DIAGNOSIS — N3289 Other specified disorders of bladder: Secondary | ICD-10-CM | POA: Diagnosis not present

## 2020-02-01 DIAGNOSIS — I4891 Unspecified atrial fibrillation: Secondary | ICD-10-CM | POA: Diagnosis not present

## 2020-02-01 DIAGNOSIS — Z20822 Contact with and (suspected) exposure to covid-19: Secondary | ICD-10-CM | POA: Diagnosis not present

## 2020-02-01 DIAGNOSIS — E785 Hyperlipidemia, unspecified: Secondary | ICD-10-CM | POA: Insufficient documentation

## 2020-02-01 DIAGNOSIS — E1142 Type 2 diabetes mellitus with diabetic polyneuropathy: Secondary | ICD-10-CM | POA: Diagnosis present

## 2020-02-01 DIAGNOSIS — N390 Urinary tract infection, site not specified: Secondary | ICD-10-CM | POA: Diagnosis not present

## 2020-02-01 DIAGNOSIS — Y998 Other external cause status: Secondary | ICD-10-CM | POA: Diagnosis not present

## 2020-02-01 DIAGNOSIS — G9340 Encephalopathy, unspecified: Secondary | ICD-10-CM | POA: Diagnosis not present

## 2020-02-01 DIAGNOSIS — M1711 Unilateral primary osteoarthritis, right knee: Secondary | ICD-10-CM | POA: Diagnosis not present

## 2020-02-01 DIAGNOSIS — M1612 Unilateral primary osteoarthritis, left hip: Secondary | ICD-10-CM | POA: Diagnosis not present

## 2020-02-01 DIAGNOSIS — M1380 Other specified arthritis, unspecified site: Secondary | ICD-10-CM | POA: Diagnosis not present

## 2020-02-01 DIAGNOSIS — N2889 Other specified disorders of kidney and ureter: Secondary | ICD-10-CM | POA: Diagnosis not present

## 2020-02-01 DIAGNOSIS — S7002XA Contusion of left hip, initial encounter: Secondary | ICD-10-CM | POA: Diagnosis not present

## 2020-02-01 DIAGNOSIS — N3000 Acute cystitis without hematuria: Secondary | ICD-10-CM | POA: Diagnosis not present

## 2020-02-01 DIAGNOSIS — M2550 Pain in unspecified joint: Secondary | ICD-10-CM | POA: Diagnosis not present

## 2020-02-01 DIAGNOSIS — S2249XD Multiple fractures of ribs, unspecified side, subsequent encounter for fracture with routine healing: Secondary | ICD-10-CM | POA: Diagnosis not present

## 2020-02-01 DIAGNOSIS — S065X0A Traumatic subdural hemorrhage without loss of consciousness, initial encounter: Secondary | ICD-10-CM | POA: Diagnosis not present

## 2020-02-01 DIAGNOSIS — F32A Depression, unspecified: Secondary | ICD-10-CM | POA: Diagnosis present

## 2020-02-01 DIAGNOSIS — E11621 Type 2 diabetes mellitus with foot ulcer: Secondary | ICD-10-CM | POA: Diagnosis not present

## 2020-02-01 DIAGNOSIS — R2689 Other abnormalities of gait and mobility: Secondary | ICD-10-CM | POA: Diagnosis not present

## 2020-02-01 DIAGNOSIS — G9341 Metabolic encephalopathy: Secondary | ICD-10-CM | POA: Diagnosis not present

## 2020-02-01 DIAGNOSIS — E875 Hyperkalemia: Secondary | ICD-10-CM | POA: Diagnosis not present

## 2020-02-01 DIAGNOSIS — F339 Major depressive disorder, recurrent, unspecified: Secondary | ICD-10-CM | POA: Diagnosis not present

## 2020-02-01 DIAGNOSIS — S300XXA Contusion of lower back and pelvis, initial encounter: Secondary | ICD-10-CM | POA: Diagnosis not present

## 2020-02-01 DIAGNOSIS — R42 Dizziness and giddiness: Secondary | ICD-10-CM | POA: Diagnosis present

## 2020-02-01 DIAGNOSIS — H5509 Other forms of nystagmus: Secondary | ICD-10-CM | POA: Diagnosis present

## 2020-02-01 DIAGNOSIS — I10 Essential (primary) hypertension: Secondary | ICD-10-CM | POA: Diagnosis present

## 2020-02-01 DIAGNOSIS — R4020 Unspecified coma: Secondary | ICD-10-CM | POA: Diagnosis not present

## 2020-02-01 DIAGNOSIS — G928 Other toxic encephalopathy: Secondary | ICD-10-CM | POA: Diagnosis not present

## 2020-02-01 DIAGNOSIS — M7981 Nontraumatic hematoma of soft tissue: Secondary | ICD-10-CM | POA: Diagnosis not present

## 2020-02-01 DIAGNOSIS — D72829 Elevated white blood cell count, unspecified: Secondary | ICD-10-CM | POA: Diagnosis not present

## 2020-02-01 DIAGNOSIS — F0390 Unspecified dementia without behavioral disturbance: Secondary | ICD-10-CM | POA: Diagnosis not present

## 2020-02-01 DIAGNOSIS — L97419 Non-pressure chronic ulcer of right heel and midfoot with unspecified severity: Secondary | ICD-10-CM | POA: Diagnosis present

## 2020-02-01 DIAGNOSIS — I480 Paroxysmal atrial fibrillation: Secondary | ICD-10-CM | POA: Diagnosis not present

## 2020-02-01 DIAGNOSIS — S0993XA Unspecified injury of face, initial encounter: Secondary | ICD-10-CM | POA: Diagnosis not present

## 2020-02-01 DIAGNOSIS — S37009D Unspecified injury of unspecified kidney, subsequent encounter: Secondary | ICD-10-CM | POA: Diagnosis not present

## 2020-02-01 DIAGNOSIS — W19XXXA Unspecified fall, initial encounter: Secondary | ICD-10-CM | POA: Diagnosis not present

## 2020-02-01 DIAGNOSIS — E119 Type 2 diabetes mellitus without complications: Secondary | ICD-10-CM | POA: Diagnosis not present

## 2020-02-01 DIAGNOSIS — J90 Pleural effusion, not elsewhere classified: Secondary | ICD-10-CM | POA: Diagnosis not present

## 2020-02-01 DIAGNOSIS — Z794 Long term (current) use of insulin: Secondary | ICD-10-CM | POA: Diagnosis not present

## 2020-02-01 DIAGNOSIS — I482 Chronic atrial fibrillation, unspecified: Secondary | ICD-10-CM | POA: Diagnosis not present

## 2020-02-01 DIAGNOSIS — S065X0D Traumatic subdural hemorrhage without loss of consciousness, subsequent encounter: Secondary | ICD-10-CM | POA: Diagnosis not present

## 2020-02-01 DIAGNOSIS — Z515 Encounter for palliative care: Secondary | ICD-10-CM | POA: Diagnosis not present

## 2020-02-01 DIAGNOSIS — L97519 Non-pressure chronic ulcer of other part of right foot with unspecified severity: Secondary | ICD-10-CM | POA: Diagnosis not present

## 2020-02-01 DIAGNOSIS — S0990XA Unspecified injury of head, initial encounter: Secondary | ICD-10-CM | POA: Diagnosis not present

## 2020-02-01 DIAGNOSIS — I2510 Atherosclerotic heart disease of native coronary artery without angina pectoris: Secondary | ICD-10-CM | POA: Diagnosis not present

## 2020-02-01 DIAGNOSIS — M79605 Pain in left leg: Secondary | ICD-10-CM | POA: Diagnosis not present

## 2020-02-01 DIAGNOSIS — I4819 Other persistent atrial fibrillation: Secondary | ICD-10-CM | POA: Diagnosis not present

## 2020-02-01 DIAGNOSIS — F419 Anxiety disorder, unspecified: Secondary | ICD-10-CM | POA: Diagnosis present

## 2020-02-01 DIAGNOSIS — I69328 Other speech and language deficits following cerebral infarction: Secondary | ICD-10-CM | POA: Diagnosis not present

## 2020-02-01 DIAGNOSIS — I6200 Nontraumatic subdural hemorrhage, unspecified: Secondary | ICD-10-CM | POA: Diagnosis not present

## 2020-02-01 DIAGNOSIS — N179 Acute kidney failure, unspecified: Secondary | ICD-10-CM | POA: Diagnosis not present

## 2020-02-01 DIAGNOSIS — A419 Sepsis, unspecified organism: Secondary | ICD-10-CM | POA: Diagnosis not present

## 2020-02-01 DIAGNOSIS — W1839XA Other fall on same level, initial encounter: Secondary | ICD-10-CM | POA: Diagnosis not present

## 2020-02-01 DIAGNOSIS — E1165 Type 2 diabetes mellitus with hyperglycemia: Secondary | ICD-10-CM | POA: Diagnosis not present

## 2020-02-01 DIAGNOSIS — I708 Atherosclerosis of other arteries: Secondary | ICD-10-CM | POA: Diagnosis not present

## 2020-02-01 DIAGNOSIS — I472 Ventricular tachycardia: Secondary | ICD-10-CM | POA: Diagnosis not present

## 2020-02-01 DIAGNOSIS — K8689 Other specified diseases of pancreas: Secondary | ICD-10-CM | POA: Diagnosis not present

## 2020-02-01 DIAGNOSIS — G4733 Obstructive sleep apnea (adult) (pediatric): Secondary | ICD-10-CM | POA: Diagnosis present

## 2020-02-01 DIAGNOSIS — R41841 Cognitive communication deficit: Secondary | ICD-10-CM | POA: Diagnosis not present

## 2020-02-01 DIAGNOSIS — R531 Weakness: Secondary | ICD-10-CM | POA: Diagnosis not present

## 2020-02-01 DIAGNOSIS — N170 Acute kidney failure with tubular necrosis: Secondary | ICD-10-CM | POA: Diagnosis not present

## 2020-02-01 DIAGNOSIS — N281 Cyst of kidney, acquired: Secondary | ICD-10-CM | POA: Diagnosis not present

## 2020-02-01 DIAGNOSIS — M6281 Muscle weakness (generalized): Secondary | ICD-10-CM | POA: Diagnosis not present

## 2020-02-01 DIAGNOSIS — R4182 Altered mental status, unspecified: Secondary | ICD-10-CM | POA: Diagnosis not present

## 2020-02-01 DIAGNOSIS — R296 Repeated falls: Secondary | ICD-10-CM | POA: Diagnosis not present

## 2020-02-01 DIAGNOSIS — N189 Chronic kidney disease, unspecified: Secondary | ICD-10-CM | POA: Diagnosis not present

## 2020-02-01 DIAGNOSIS — R404 Transient alteration of awareness: Secondary | ICD-10-CM | POA: Diagnosis not present

## 2020-02-01 DIAGNOSIS — I618 Other nontraumatic intracerebral hemorrhage: Secondary | ICD-10-CM | POA: Diagnosis not present

## 2020-02-01 DIAGNOSIS — G929 Unspecified toxic encephalopathy: Secondary | ICD-10-CM | POA: Diagnosis not present

## 2020-02-01 DIAGNOSIS — L97529 Non-pressure chronic ulcer of other part of left foot with unspecified severity: Secondary | ICD-10-CM | POA: Diagnosis not present

## 2020-02-01 DIAGNOSIS — S065XAA Traumatic subdural hemorrhage with loss of consciousness status unknown, initial encounter: Secondary | ICD-10-CM | POA: Insufficient documentation

## 2020-02-01 NOTE — Patient Outreach (Signed)
Member screened for potential [facility] Care Management needs as a benefit of [place] Medicare.  Per Patient [name] member resides in [facility].   Communication sent to SNF SW to collaborate about anticipated dc plans and potential [facility] Care Management needs.   Marthenia Rolling, MSN-Ed, RN,BSN [place] Acute Care Coordinator [phone] [facility]) [phone]  (Toll free office)

## 2020-02-12 DIAGNOSIS — F413 Other mixed anxiety disorders: Secondary | ICD-10-CM | POA: Diagnosis not present

## 2020-02-12 DIAGNOSIS — N2889 Other specified disorders of kidney and ureter: Secondary | ICD-10-CM | POA: Diagnosis not present

## 2020-02-12 DIAGNOSIS — E875 Hyperkalemia: Secondary | ICD-10-CM | POA: Diagnosis not present

## 2020-02-12 DIAGNOSIS — R41841 Cognitive communication deficit: Secondary | ICD-10-CM | POA: Diagnosis not present

## 2020-02-12 DIAGNOSIS — E785 Hyperlipidemia, unspecified: Secondary | ICD-10-CM | POA: Diagnosis not present

## 2020-02-12 DIAGNOSIS — F339 Major depressive disorder, recurrent, unspecified: Secondary | ICD-10-CM | POA: Diagnosis not present

## 2020-02-12 DIAGNOSIS — I4891 Unspecified atrial fibrillation: Secondary | ICD-10-CM | POA: Diagnosis not present

## 2020-02-12 DIAGNOSIS — S37009D Unspecified injury of unspecified kidney, subsequent encounter: Secondary | ICD-10-CM | POA: Diagnosis not present

## 2020-02-12 DIAGNOSIS — Z7401 Bed confinement status: Secondary | ICD-10-CM | POA: Diagnosis not present

## 2020-02-12 DIAGNOSIS — S065X0D Traumatic subdural hemorrhage without loss of consciousness, subsequent encounter: Secondary | ICD-10-CM | POA: Diagnosis not present

## 2020-02-12 DIAGNOSIS — N179 Acute kidney failure, unspecified: Secondary | ICD-10-CM | POA: Diagnosis not present

## 2020-02-12 DIAGNOSIS — E119 Type 2 diabetes mellitus without complications: Secondary | ICD-10-CM | POA: Diagnosis not present

## 2020-02-12 DIAGNOSIS — S2249XD Multiple fractures of ribs, unspecified side, subsequent encounter for fracture with routine healing: Secondary | ICD-10-CM | POA: Diagnosis not present

## 2020-02-12 DIAGNOSIS — I10 Essential (primary) hypertension: Secondary | ICD-10-CM | POA: Diagnosis not present

## 2020-02-12 DIAGNOSIS — M2550 Pain in unspecified joint: Secondary | ICD-10-CM | POA: Diagnosis not present

## 2020-02-12 DIAGNOSIS — N390 Urinary tract infection, site not specified: Secondary | ICD-10-CM | POA: Diagnosis not present

## 2020-02-12 DIAGNOSIS — R2689 Other abnormalities of gait and mobility: Secondary | ICD-10-CM | POA: Diagnosis not present

## 2020-02-12 DIAGNOSIS — I480 Paroxysmal atrial fibrillation: Secondary | ICD-10-CM | POA: Diagnosis not present

## 2020-02-12 DIAGNOSIS — L97412 Non-pressure chronic ulcer of right heel and midfoot with fat layer exposed: Secondary | ICD-10-CM | POA: Diagnosis not present

## 2020-02-12 DIAGNOSIS — F338 Other recurrent depressive disorders: Secondary | ICD-10-CM | POA: Diagnosis not present

## 2020-02-12 DIAGNOSIS — I119 Hypertensive heart disease without heart failure: Secondary | ICD-10-CM | POA: Diagnosis not present

## 2020-02-12 DIAGNOSIS — R413 Other amnesia: Secondary | ICD-10-CM | POA: Diagnosis not present

## 2020-02-12 DIAGNOSIS — F419 Anxiety disorder, unspecified: Secondary | ICD-10-CM | POA: Diagnosis not present

## 2020-02-12 DIAGNOSIS — G929 Unspecified toxic encephalopathy: Secondary | ICD-10-CM | POA: Diagnosis not present

## 2020-02-12 DIAGNOSIS — L03116 Cellulitis of left lower limb: Secondary | ICD-10-CM | POA: Diagnosis not present

## 2020-02-12 DIAGNOSIS — R531 Weakness: Secondary | ICD-10-CM | POA: Diagnosis not present

## 2020-02-12 DIAGNOSIS — M6281 Muscle weakness (generalized): Secondary | ICD-10-CM | POA: Diagnosis not present

## 2020-02-12 DIAGNOSIS — N189 Chronic kidney disease, unspecified: Secondary | ICD-10-CM | POA: Diagnosis not present

## 2020-02-12 DIAGNOSIS — R4182 Altered mental status, unspecified: Secondary | ICD-10-CM | POA: Diagnosis not present

## 2020-02-12 DIAGNOSIS — I2510 Atherosclerotic heart disease of native coronary artery without angina pectoris: Secondary | ICD-10-CM | POA: Diagnosis not present

## 2020-02-12 DIAGNOSIS — A419 Sepsis, unspecified organism: Secondary | ICD-10-CM | POA: Diagnosis not present

## 2020-02-12 DIAGNOSIS — F0390 Unspecified dementia without behavioral disturbance: Secondary | ICD-10-CM | POA: Diagnosis not present

## 2020-02-12 DIAGNOSIS — M810 Age-related osteoporosis without current pathological fracture: Secondary | ICD-10-CM | POA: Diagnosis not present

## 2020-02-12 DIAGNOSIS — G928 Other toxic encephalopathy: Secondary | ICD-10-CM | POA: Diagnosis not present

## 2020-02-12 DIAGNOSIS — M1380 Other specified arthritis, unspecified site: Secondary | ICD-10-CM | POA: Diagnosis not present

## 2020-02-15 DIAGNOSIS — M810 Age-related osteoporosis without current pathological fracture: Secondary | ICD-10-CM | POA: Diagnosis not present

## 2020-02-15 DIAGNOSIS — I4891 Unspecified atrial fibrillation: Secondary | ICD-10-CM | POA: Diagnosis not present

## 2020-02-15 DIAGNOSIS — I119 Hypertensive heart disease without heart failure: Secondary | ICD-10-CM | POA: Diagnosis not present

## 2020-02-15 DIAGNOSIS — F0390 Unspecified dementia without behavioral disturbance: Secondary | ICD-10-CM | POA: Diagnosis not present

## 2020-02-21 DIAGNOSIS — R413 Other amnesia: Secondary | ICD-10-CM | POA: Diagnosis not present

## 2020-02-22 DIAGNOSIS — F338 Other recurrent depressive disorders: Secondary | ICD-10-CM | POA: Diagnosis not present

## 2020-02-22 DIAGNOSIS — F0390 Unspecified dementia without behavioral disturbance: Secondary | ICD-10-CM | POA: Diagnosis not present

## 2020-02-22 DIAGNOSIS — F413 Other mixed anxiety disorders: Secondary | ICD-10-CM | POA: Diagnosis not present

## 2020-02-27 DIAGNOSIS — L97412 Non-pressure chronic ulcer of right heel and midfoot with fat layer exposed: Secondary | ICD-10-CM | POA: Diagnosis not present

## 2020-02-27 IMAGING — CR LEFT FOOT - COMPLETE 3+ VIEW
3 series · 3 of 3 positions shown · non-contrast
Comparison: Radiographs dated 12/01/2017 and CT scan dated
03/30/2018

CLINICAL DATA: Diabetic ulcer of the hindfoot at the plantar aspect
of the calcaneus. Osteomyelitis demonstrated on recent biopsy.

EXAM:
LEFT FOOT - COMPLETE 3+ VIEW

[t foot ap left]
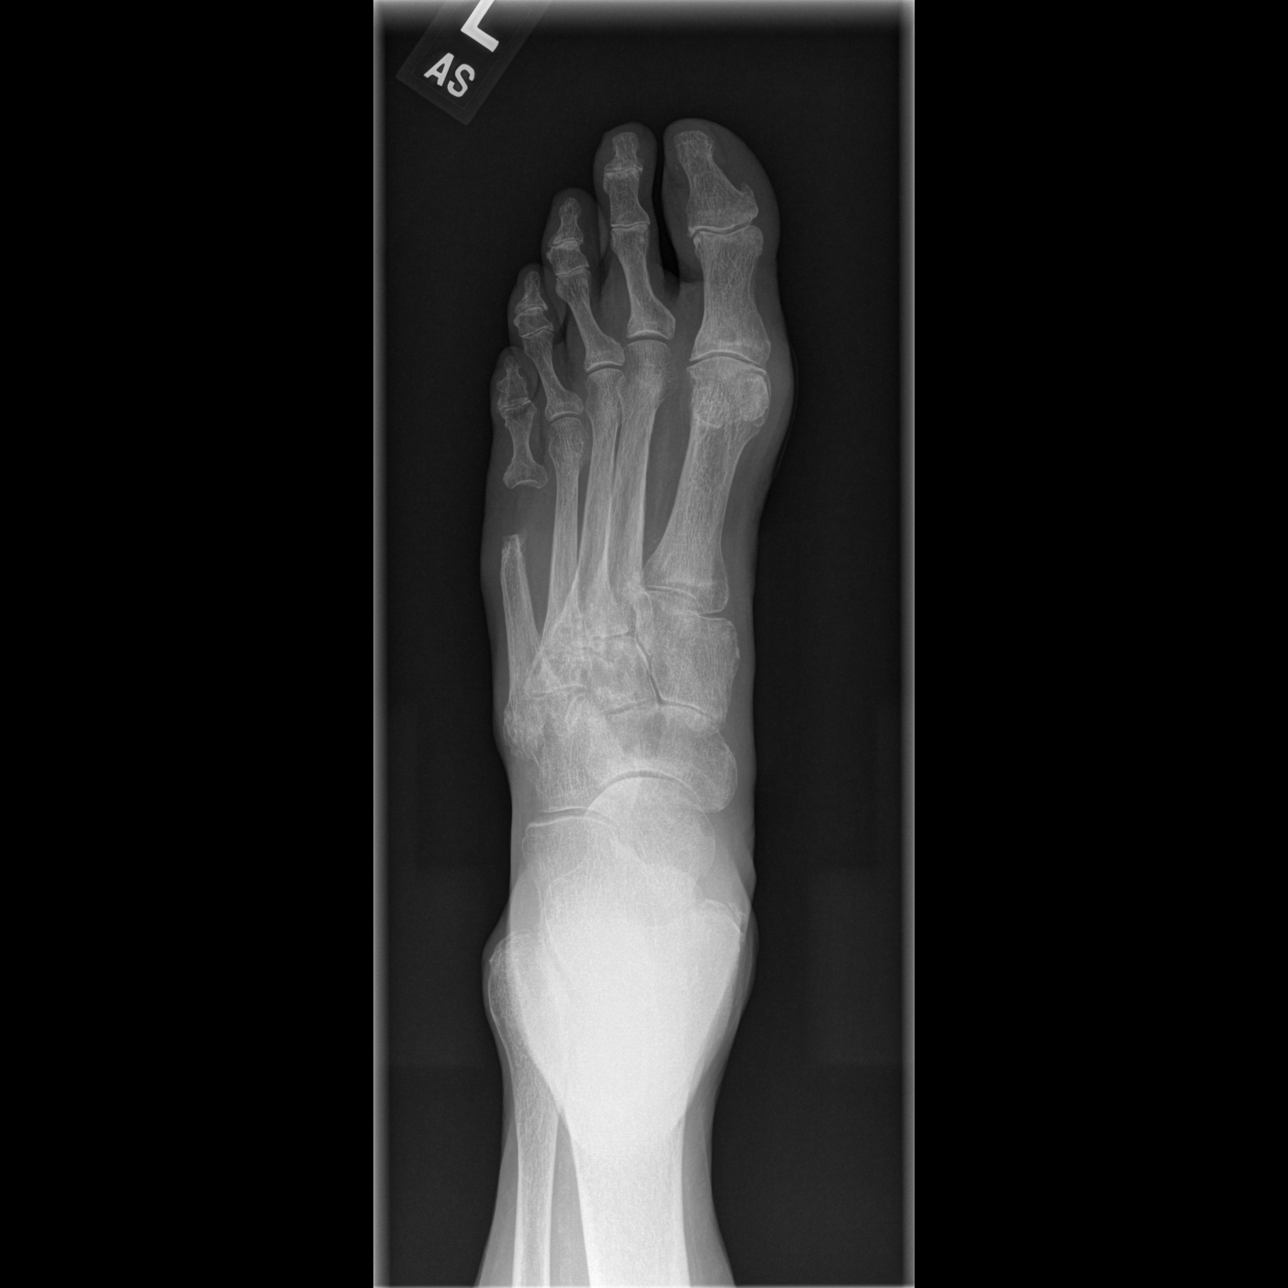

[t foot oblique left]
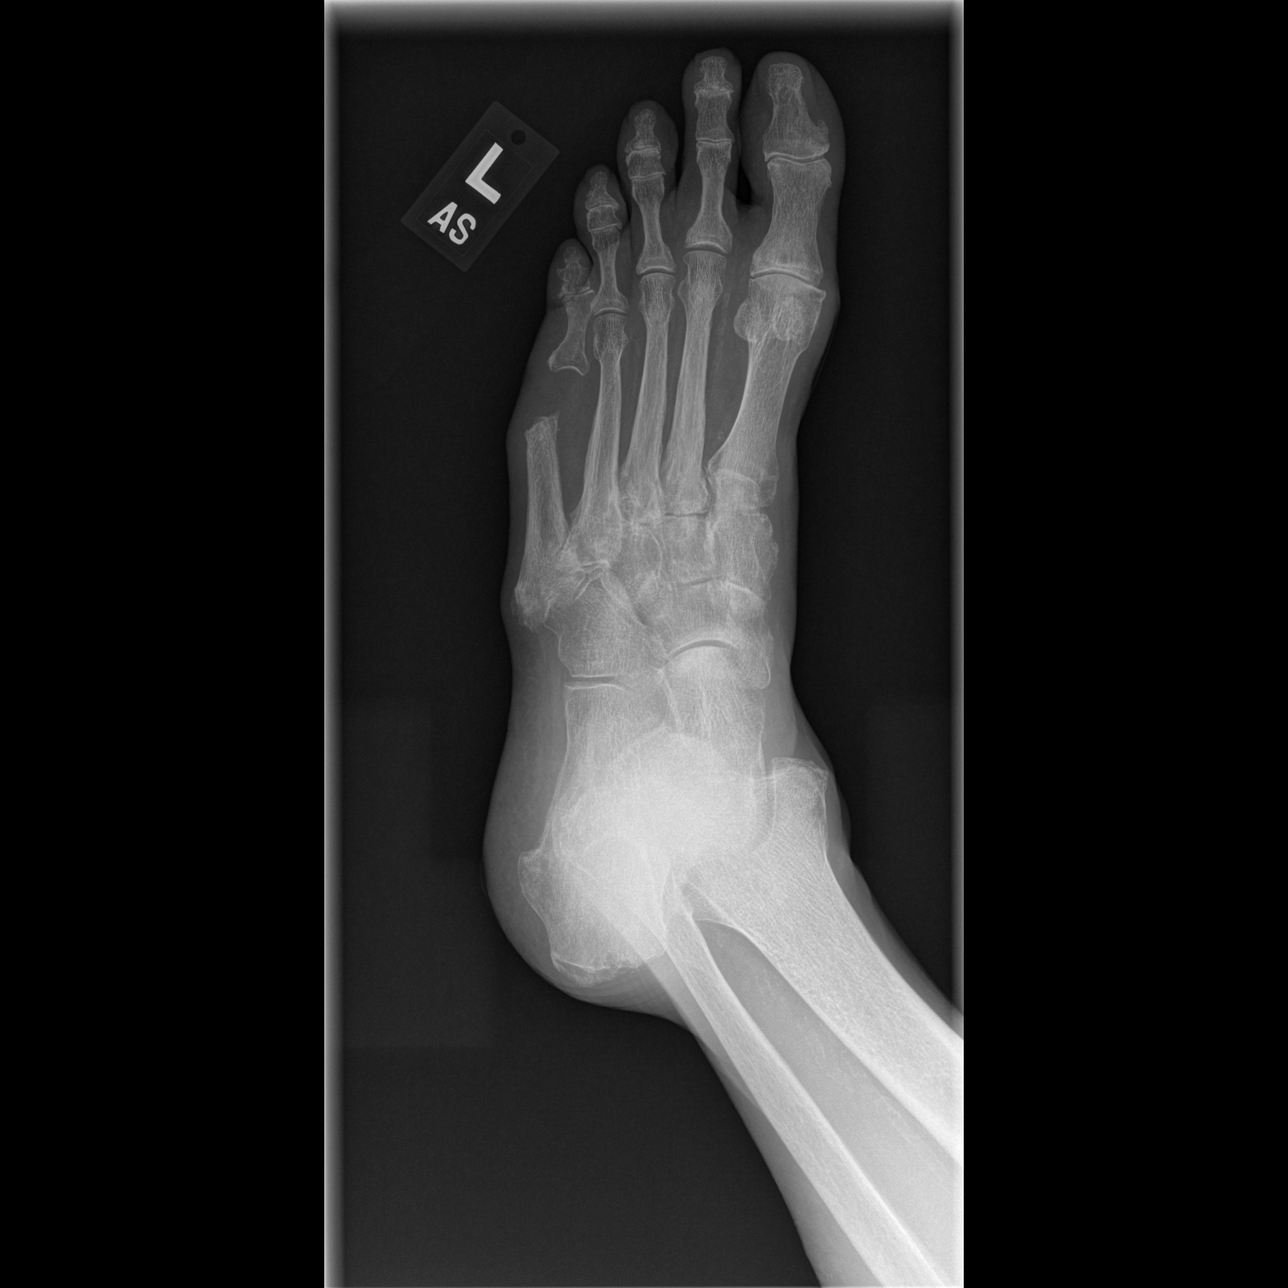

[t foot lat left]
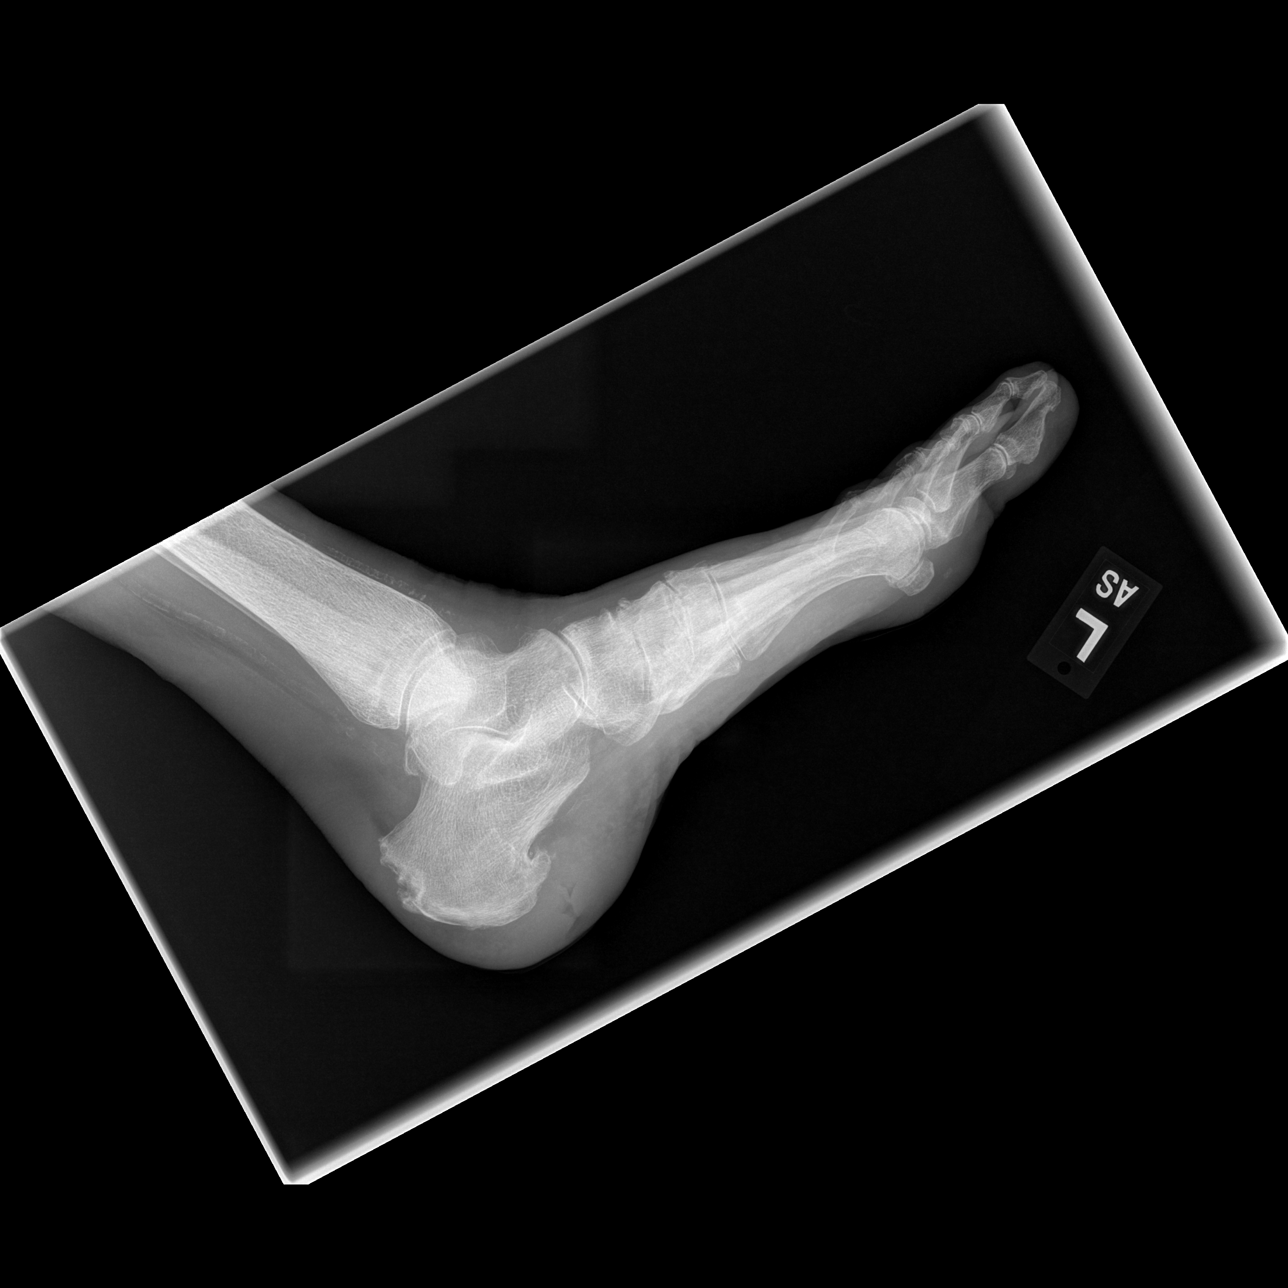

[3 of 3 positions shown; findings below may reference images not displayed]

FINDINGS: There is a soft tissue ulcer on the plantar aspect of the heel.
There is an irregular calcaneal enthesophyte at that site. No
discrete cortical destruction of the calcaneus. Haglund deformity at
the Achilles insertion on the posterior calcaneus, unchanged.

The patient has moderate arthritis in the midfoot. Previous
amputation of the distal fifth metatarsal. Flatfoot deformity.

Arthritic changes of the IP joints of the toes and at the first MTP
joint, minimally progressed.
IMPRESSION: 1. No radiographic evidence of osteomyelitis.
2. Deep soft tissue ulceration of the plantar aspect of the heel.
3. Multifocal osteoarthritic changes.

## 2020-02-28 ENCOUNTER — Other Ambulatory Visit: Payer: Self-pay | Admitting: *Deleted

## 2020-02-28 NOTE — Patient Outreach (Signed)
Member screened for potential [facility] Care Management needs as a benefit of [employer].  Per Patient Ping member resides in [facility].   Communication sent to [facility] SW to collaborate about anticipated dc plans and potential [facility] Care Management needs.  Will continue to follow while member resides in SNF.    Jennifermarie Franzen, MSN-Ed, RN,BSN [facility] Post Acute Care Coordinator [phone] ( [employer]) [phone]  (Toll free office)  

## 2020-03-08 ENCOUNTER — Other Ambulatory Visit: Payer: Self-pay | Admitting: *Deleted

## 2020-03-08 NOTE — Patient Outreach (Signed)
[facility] Post- Acute Care Coordinator follow up. Member screened for potential [facility] Care Management needs as a benefit of [place] Medicare.  Verified in Patient [name] that member transitioned to home from [facility] on 03/05/20.  Telephone call made to [facility] home phone [phone]. No answer. Left HIPAA compliant voicemail message. Telephone call made to mobile phone [phone]. Spoke with daughter/ DPR Baird Lyons. Patient identifiers confirmed.   Cecille Rubin reports member is doing well. Has PCP appointment scheduled next week. [facility] will come on Saturday. Explained [place] Management services. Cecille Rubin pleasantly declined and thanked Probation officer for the call.  No further needs assessed as daughter declined [place] Management follow up.    Marthenia Rolling, MSN, RN,BSN [place] Acute Care Coordinator [phone] [facility]) [phone]  (Toll free office)

## 2020-03-12 ENCOUNTER — Ambulatory Visit (INDEPENDENT_AMBULATORY_CARE_PROVIDER_SITE_OTHER): Payer: Medicare Other | Admitting: Sports Medicine

## 2020-03-12 ENCOUNTER — Other Ambulatory Visit: Payer: Self-pay

## 2020-03-12 ENCOUNTER — Encounter: Payer: Self-pay | Admitting: Sports Medicine

## 2020-03-12 DIAGNOSIS — E1142 Type 2 diabetes mellitus with diabetic polyneuropathy: Secondary | ICD-10-CM

## 2020-03-12 DIAGNOSIS — M79609 Pain in unspecified limb: Secondary | ICD-10-CM

## 2020-03-12 DIAGNOSIS — L84 Corns and callosities: Secondary | ICD-10-CM | POA: Diagnosis not present

## 2020-03-12 DIAGNOSIS — Z9889 Other specified postprocedural states: Secondary | ICD-10-CM

## 2020-03-12 DIAGNOSIS — L97912 Non-pressure chronic ulcer of unspecified part of right lower leg with fat layer exposed: Secondary | ICD-10-CM | POA: Diagnosis not present

## 2020-03-12 DIAGNOSIS — F0150 Vascular dementia without behavioral disturbance: Secondary | ICD-10-CM | POA: Diagnosis not present

## 2020-03-12 DIAGNOSIS — B351 Tinea unguium: Secondary | ICD-10-CM | POA: Diagnosis not present

## 2020-03-12 NOTE — Progress Notes (Signed)
Subjective: Cindy Hahn is a 85 y.o. female patient seen today in office for POV #14 (DOS 06/2019) , S/P R Achilles debridement and I&D, Patient is assisted by daughter this visit who reports that Mom was admitted to [facility] for [facility] months after having an episode of fall and unresponsiveness was treated at [facility] in [facility].  Patient denies any other pedal complaints at this time. No other issues noted.   Patient Active Problem List   Diagnosis Date Noted  . Osteomyelitis ([place]) 10/17/2018  . Diabetic foot ulcer ([place]) 05/05/2018  . History of CVA (cerebrovascular accident) 05/05/2018  . Dementia ([place]) 05/05/2018  . Diabetic polyneuropathy associated with type 2 diabetes mellitus ([place]) 06/04/2015  . Pacemaker 03/11/2015  . Bradycardia 02/06/2015  . Chronic atrial fibrillation ([place]) 11/01/2014  . Essential hypertension 11/01/2014  . Diabetes mellitus ([place]) 11/01/2014    Current Outpatient Medications on File Prior to Visit  Medication Sig Dispense Refill  . ALPRAZolam (XANAX) 1 MG tablet Take 1 mg by mouth.    Marland Kitchen aspirin EC 81 MG tablet Take 81 mg by mouth.    . BD INSULIN SYRINGE U/F 31G X 5/16" 1 ML MISC     . collagenase (SANTYL) ointment Apply 1 application topically daily. 30 g 0  . digoxin (LANOXIN) 0.125 MG tablet Take 0.0625 mg by mouth daily.   0  . diltiazem (CARDIZEM) 90 MG tablet Take 1 tablet (90 mg total) by mouth 2 (two) times daily. Please make overdue appt with Dr. Curt Bears before anymore refills. 1st attempt 60 tablet 0  . doxycycline (VIBRA-TABS) 100 MG tablet Take 1 tablet (100 mg total) by mouth 2 (two) times daily. 20 tablet 0  . fluconazole (DIFLUCAN) 150 MG tablet Take 1 tablet (150 mg total) by mouth daily. 10 tablet 1  . gabapentin (NEURONTIN) 100 MG capsule Take 1 capsule (100 mg total) by mouth at bedtime. 30 capsule 3  . LEVEMIR 100 UNIT/ML injection Inject 74 Units into the skin.     . metoprolol tartrate (LOPRESSOR) 50 MG tablet Take 75 mg by mouth 2  (two) times daily.   0  . naproxen sodium (ALEVE) 220 MG tablet Take 220 mg by mouth daily.    Marland Kitchen NOVOLOG FLEXPEN 100 UNIT/ML FlexPen inject 10 units SUBCUTANEOUSLY WITH MAIN MEAL  0  . potassium chloride SA (K-DUR,KLOR-CON) 20 MEQ tablet Take 20 mEq by mouth daily.   0  . rosuvastatin (CRESTOR) 20 MG tablet   0  . sertraline (ZOLOFT) 100 MG tablet     . silver nitrate applicators 61-44 % applicator Apply to wound every other day then cover with guaze and papertape 100 each 0  . sulfamethoxazole-trimethoprim (BACTRIM) 400-80 MG tablet Take 1 tablet by mouth 2 (two) times daily. 28 tablet 0   No current facility-administered medications on file prior to visit.    Allergies  Allergen Reactions  . Levofloxacin Other (See Comments)    confusion    Objective: There were no vitals filed for this visit.  General: No acute distress, AAOx3  Right posterior heel/leg, Wound/ dehiscence at surgical site centrally that measures 1.5 x 1 x0.3cm with granular base with no exposed tendon fibers, No warmth, mild bloody drainage, mild swelling to right posterior heel/leg, no other signs of infection noted to this area.  Nails x 10 are thickened and elongated.   Preulcerative callus noted submet 5 on right with no signs of infection  Preulcerative callus noted submet 1 on left with  no signs of infection  Preulcerative callus submet 5 on left and heel that remains healed with no re-opening.  Capillary fill time <3 seconds in all digits, gross sensation present via light touch bilateral + mild pain to right leg.  No pain with calf compression.  Assessment and Plan:  Problem List Items Addressed This Visit      Endocrine   Diabetic polyneuropathy associated with type 2 diabetes mellitus ([place])     Nervous and Auditory   Dementia ([place])    Other Visit Diagnoses    Leg ulcer, right, with fat layer exposed ([place])    -  Primary   S/P foot surgery, right       Pain due to onychomycosis of nail        Pre-ulcerative calluses          -Patient seen and evaluated -Mechanically debrided callus x 4 using sterile chisel blade without incident and nails using a sterile nail nipper without incident -To all wounds very minimal debridement performed today using a saline moistened gauze and tissue nipper and dermal curette to patient's tolerance at right leg -Applied prisma and mepliex border dressing  -Home nurse orders sent for wound care as above -Continue with wheelchair and good supportive shoes for foot type -Return to office in 3-4 weeks or sooner if problems arise.   Landis Martins, DPM

## 2020-03-13 DIAGNOSIS — R296 Repeated falls: Secondary | ICD-10-CM | POA: Diagnosis not present

## 2020-03-13 DIAGNOSIS — B372 Candidiasis of skin and nail: Secondary | ICD-10-CM | POA: Diagnosis not present

## 2020-03-13 DIAGNOSIS — E1165 Type 2 diabetes mellitus with hyperglycemia: Secondary | ICD-10-CM | POA: Diagnosis not present

## 2020-03-13 DIAGNOSIS — N390 Urinary tract infection, site not specified: Secondary | ICD-10-CM | POA: Diagnosis not present

## 2020-03-18 ENCOUNTER — Telehealth: Payer: Self-pay | Admitting: *Deleted

## 2020-03-18 NOTE — Telephone Encounter (Signed)
-----   Message from [place], [place] sent at 03/12/2020  5:16 PM EST ----- Regarding: Wound care orders [facility] Apply prisma/collagen sponge to right achilles wound covered with border dressing 2x per week  Apply antibiotic cream to left 1st toe as needed until abrasion has healed.  -Dr. Cannon Kettle

## 2020-03-18 NOTE — Telephone Encounter (Signed)
Sent over the request per Dr Cannon Kettle to the [facility] and faxed to [phone]. Cindy Hahn

## 2020-03-19 DIAGNOSIS — N390 Urinary tract infection, site not specified: Secondary | ICD-10-CM | POA: Diagnosis not present

## 2020-03-20 ENCOUNTER — Ambulatory Visit: Payer: Medicare Other | Admitting: Sports Medicine

## 2020-03-20 DIAGNOSIS — S065X0D Traumatic subdural hemorrhage without loss of consciousness, subsequent encounter: Secondary | ICD-10-CM | POA: Diagnosis not present

## 2020-03-27 ENCOUNTER — Ambulatory Visit: Payer: Medicare Other | Admitting: Diagnostic Neuroimaging

## 2020-04-01 DIAGNOSIS — S065X0D Traumatic subdural hemorrhage without loss of consciousness, subsequent encounter: Secondary | ICD-10-CM | POA: Diagnosis not present

## 2020-04-03 ENCOUNTER — Other Ambulatory Visit: Payer: Self-pay

## 2020-04-03 ENCOUNTER — Ambulatory Visit (INDEPENDENT_AMBULATORY_CARE_PROVIDER_SITE_OTHER): Payer: Medicare Other | Admitting: Sports Medicine

## 2020-04-03 ENCOUNTER — Encounter: Payer: Self-pay | Admitting: Sports Medicine

## 2020-04-03 DIAGNOSIS — F0150 Vascular dementia without behavioral disturbance: Secondary | ICD-10-CM | POA: Diagnosis not present

## 2020-04-03 DIAGNOSIS — L97912 Non-pressure chronic ulcer of unspecified part of right lower leg with fat layer exposed: Secondary | ICD-10-CM

## 2020-04-03 DIAGNOSIS — E1142 Type 2 diabetes mellitus with diabetic polyneuropathy: Secondary | ICD-10-CM | POA: Diagnosis not present

## 2020-04-03 DIAGNOSIS — L84 Corns and callosities: Secondary | ICD-10-CM

## 2020-04-03 DIAGNOSIS — Z9889 Other specified postprocedural states: Secondary | ICD-10-CM | POA: Diagnosis not present

## 2020-04-03 NOTE — Progress Notes (Signed)
Subjective: Cindy Hahn is a 85 y.o. female patient seen today in office for POV #15 (DOS 06/2019) , S/P R Achilles debridement and I&D, Patient is assisted by daughter this visit who reports that Mom is doing better and blood sugars are under better control.  Patient denies any other pedal complaints at this time. No other issues noted.   Review of system noncontributory  Patient Active Problem List   Diagnosis Date Noted  . Hyperlipidemia 02/01/2020  . Subdural hematoma ([place]) 02/01/2020  . Osteomyelitis ([place]) 10/17/2018  . Diabetic foot ulcer ([place]) 05/05/2018  . History of CVA (cerebrovascular accident) 05/05/2018  . Dementia ([place]) 05/05/2018  . Diabetic polyneuropathy associated with type 2 diabetes mellitus ([place]) 06/04/2015  . Pacemaker 03/11/2015  . Bradycardia 02/06/2015  . Chronic atrial fibrillation ([place]) 11/01/2014  . Essential hypertension 11/01/2014  . Diabetes mellitus ([place]) 11/01/2014    Current Outpatient Medications on File Prior to Visit  Medication Sig Dispense Refill  . acetaminophen (TYLENOL) 325 MG tablet Take by mouth.    . ALPRAZolam (XANAX) 0.25 MG tablet     . ALPRAZolam (XANAX) 1 MG tablet Take 1 mg by mouth.    Marland Kitchen ampicillin (PRINCIPEN) 500 MG capsule     . aspirin EC 81 MG tablet Take 81 mg by mouth.    . BD INSULIN SYRINGE U/F 31G X 5/16" 1 ML MISC     . cefTRIAXone (ROCEPHIN) 1 g injection     . cefUROXime (CEFTIN) 250 MG tablet     . Cholecalciferol 125 MCG (5000 UT) TABS Take by mouth.    . collagenase (SANTYL) ointment Apply 1 application topically daily. 30 g 0  . Cranberry-Vitamin C-Inulin (UTI-STAT) LIQD Take by mouth.    . digoxin (LANOXIN) 0.125 MG tablet Take 0.0625 mg by mouth daily.   0  . diltiazem (CARDIZEM) 30 MG tablet     . diltiazem (CARDIZEM) 90 MG tablet Take 1 tablet (90 mg total) by mouth 2 (two) times daily. Please make overdue appt with Dr. Curt Bears before anymore refills. 1st attempt 60 tablet 0  . doxycycline (VIBRA-TABS)  100 MG tablet Take 1 tablet (100 mg total) by mouth 2 (two) times daily. 20 tablet 0  . doxycycline (VIBRAMYCIN) 100 MG capsule     . fluconazole (DIFLUCAN) 100 MG tablet     . fluconazole (DIFLUCAN) 150 MG tablet Take 1 tablet (150 mg total) by mouth daily. 10 tablet 1  . gabapentin (NEURONTIN) 100 MG capsule Take 1 capsule (100 mg total) by mouth at bedtime. 30 capsule 3  . insulin regular (NOVOLIN R) 100 units/mL injection Inject into the skin.    Marland Kitchen LEVEMIR 100 UNIT/ML injection Inject 74 Units into the skin.     Marland Kitchen lidocaine (XYLOCAINE) 1 % (with preservative) injection     . metoprolol tartrate (LOPRESSOR) 50 MG tablet Take 75 mg by mouth 2 (two) times daily.   0  . Multiple Vitamin (QUINTABS) TABS Take 1 tablet by mouth daily.    . naproxen sodium (ALEVE) 220 MG tablet Take 220 mg by mouth daily.    . nitrofurantoin, macrocrystal-monohydrate, (MACROBID) 100 MG capsule     . NOVOLOG FLEXPEN 100 UNIT/ML FlexPen inject 10 units SUBCUTANEOUSLY WITH MAIN MEAL  0  . NYAMYC powder     . nystatin cream (MYCOSTATIN)     . potassium chloride SA (K-DUR,KLOR-CON) 20 MEQ tablet Take 20 mEq by mouth daily.   0  . rosuvastatin (CRESTOR) 20 MG tablet  0  . sertraline (ZOLOFT) 100 MG tablet     . sertraline (ZOLOFT) 50 MG tablet     . silver nitrate applicators 61-60 % applicator Apply to wound every other day then cover with guaze and papertape 100 each 0  . sulfamethoxazole-trimethoprim (BACTRIM) 400-80 MG tablet Take 1 tablet by mouth 2 (two) times daily. 28 tablet 0   No current facility-administered medications on file prior to visit.    Allergies  Allergen Reactions  . Levofloxacin Other (See Comments)    confusion    Objective: There were no vitals filed for this visit.  General: No acute distress, AAOx3  Right posterior heel/leg, Wound/ dehiscence at surgical site centrally that measures 2 x 1.5 x0.3cm with granular base with no exposed tendon fibers, No warmth, mild bloody drainage,  mild swelling to right posterior heel/leg, no other signs of infection noted to this area.  Nails x 10 are short and thickened  Preulcerative callus noted submet 5 and lateral fifth metatarsal base/styloid process on right with no signs of infection  Preulcerative callus noted submet 1 on left with no signs of infection  Preulcerative callus submet 5 on left and heel that remains healed with no re-opening.  Capillary fill time <3 seconds in all digits, gross sensation present via light touch bilateral + mild pain to right leg.  No pain with calf compression.  Assessment and Plan:  Problem List Items Addressed This Visit      Endocrine   Diabetic polyneuropathy associated with type 2 diabetes mellitus (HCC)   Relevant Medications   ALPRAZolam (XANAX) 0.25 MG tablet   insulin regular (NOVOLIN R) 100 units/mL injection   sertraline (ZOLOFT) 50 MG tablet     Nervous and Auditory   Dementia (HCC)   Relevant Medications   ALPRAZolam (XANAX) 0.25 MG tablet   sertraline (ZOLOFT) 50 MG tablet    Other Visit Diagnoses    Leg ulcer, right, with fat layer exposed ([place])    -  Primary   S/P foot surgery, right       Pre-ulcerative calluses          -Patient seen and evaluated -Mechanically debrided callus x 4 using sterile chisel blade without incident and nails using a sterile nail nipper without incident -To right Achilles ulcer very minimal debridement performed today using a saline moistened gauze -Applied Betadine to all the ulcerative callus sites and applied a protective Band-Aid to the right fifth metatarsal base/styloid process -Applied prisma and mepliex border dressing to right lower leg at Achilles -Home nurse orders sent for wound care as above -Continue with wheelchair and good supportive shoes for foot type and encourage patient to wear shoes even when in the house to prevent rubbing and worsening of ulceration -Return to office in 3-4 weeks or sooner if problems arise.    Landis Martins, DPM

## 2020-04-04 DIAGNOSIS — E113412 Type 2 diabetes mellitus with severe nonproliferative diabetic retinopathy with macular edema, left eye: Secondary | ICD-10-CM | POA: Diagnosis not present

## 2020-04-04 DIAGNOSIS — E113411 Type 2 diabetes mellitus with severe nonproliferative diabetic retinopathy with macular edema, right eye: Secondary | ICD-10-CM | POA: Diagnosis not present

## 2020-04-15 ENCOUNTER — Ambulatory Visit (INDEPENDENT_AMBULATORY_CARE_PROVIDER_SITE_OTHER): Payer: Medicare Other

## 2020-04-15 DIAGNOSIS — I482 Chronic atrial fibrillation, unspecified: Secondary | ICD-10-CM | POA: Diagnosis not present

## 2020-04-17 LAB — CUP PACEART REMOTE DEVICE CHECK
Battery Impedance: 1139 Ohm
Battery Remaining Longevity: 62 mo
Battery Voltage: 2.76 V
Brady Statistic RV Percent Paced: 15 %
Date Time Interrogation Session: 20211228170353
Implantable Lead Implant Date: 20161027
Implantable Lead Location: 753860
Implantable Lead Model: 4092
Implantable Pulse Generator Implant Date: 20161027
Lead Channel Impedance Value: 0 Ohm
Lead Channel Impedance Value: 582 Ohm
Lead Channel Pacing Threshold Amplitude: 0.5 V
Lead Channel Pacing Threshold Pulse Width: 0.4 ms
Lead Channel Setting Pacing Amplitude: 2 V
Lead Channel Setting Pacing Pulse Width: 0.4 ms
Lead Channel Setting Sensing Sensitivity: 2 mV

## 2020-04-22 ENCOUNTER — Telehealth: Payer: Self-pay | Admitting: *Deleted

## 2020-04-22 MED ORDER — METOPROLOL TARTRATE 100 MG PO TABS: 100.0000 mg | ORAL_TABLET | Freq: Two times a day (BID) | ORAL | 3 refills | Status: AC

## 2020-04-22 NOTE — Telephone Encounter (Signed)
Patient daughter notified of medication dose change medication sent to pharmacy.

## 2020-04-22 NOTE — Telephone Encounter (Signed)
Left message to call back  

## 2020-04-22 NOTE — Telephone Encounter (Signed)
-----   Message from Will Meredith Leeds, MD sent at 04/17/2020  1:37 PM EST ----- Normal remote reviewed. Battery and lead parameters stable. Rapid af episodes. Increase metoprolol to 100 mg BID.

## 2020-04-22 NOTE — Telephone Encounter (Signed)
Patient's daughter returning call. 

## 2020-04-24 DIAGNOSIS — Z8679 Personal history of other diseases of the circulatory system: Secondary | ICD-10-CM | POA: Diagnosis not present

## 2020-04-24 DIAGNOSIS — F0281 Dementia in other diseases classified elsewhere with behavioral disturbance: Secondary | ICD-10-CM | POA: Diagnosis not present

## 2020-04-24 DIAGNOSIS — G3183 Dementia with Lewy bodies: Secondary | ICD-10-CM | POA: Diagnosis not present

## 2020-04-24 DIAGNOSIS — F0280 Dementia in other diseases classified elsewhere without behavioral disturbance: Secondary | ICD-10-CM | POA: Insufficient documentation

## 2020-04-24 DIAGNOSIS — Z79899 Other long term (current) drug therapy: Secondary | ICD-10-CM | POA: Diagnosis not present

## 2020-04-25 DIAGNOSIS — R300 Dysuria: Secondary | ICD-10-CM | POA: Diagnosis not present

## 2020-04-29 NOTE — Progress Notes (Signed)
Remote pacemaker transmission.   

## 2020-05-01 ENCOUNTER — Other Ambulatory Visit: Payer: Medicare Other | Admitting: Orthotics

## 2020-05-07 ENCOUNTER — Ambulatory Visit: Payer: Medicare Other | Admitting: Sports Medicine

## 2020-05-15 ENCOUNTER — Ambulatory Visit: Payer: Medicare Other | Admitting: Sports Medicine

## 2020-05-22 ENCOUNTER — Ambulatory Visit: Payer: Medicare Other | Admitting: Sports Medicine

## 2020-05-23 DIAGNOSIS — R079 Chest pain, unspecified: Secondary | ICD-10-CM | POA: Diagnosis not present

## 2020-05-23 DIAGNOSIS — I10 Essential (primary) hypertension: Secondary | ICD-10-CM | POA: Diagnosis not present

## 2020-05-23 DIAGNOSIS — Z853 Personal history of malignant neoplasm of breast: Secondary | ICD-10-CM | POA: Diagnosis not present

## 2020-05-23 DIAGNOSIS — I129 Hypertensive chronic kidney disease with stage 1 through stage 4 chronic kidney disease, or unspecified chronic kidney disease: Secondary | ICD-10-CM | POA: Diagnosis not present

## 2020-05-23 DIAGNOSIS — N189 Chronic kidney disease, unspecified: Secondary | ICD-10-CM | POA: Diagnosis not present

## 2020-05-23 DIAGNOSIS — G3183 Dementia with Lewy bodies: Secondary | ICD-10-CM | POA: Diagnosis not present

## 2020-05-23 DIAGNOSIS — F0281 Dementia in other diseases classified elsewhere with behavioral disturbance: Secondary | ICD-10-CM | POA: Diagnosis not present

## 2020-05-23 DIAGNOSIS — E1165 Type 2 diabetes mellitus with hyperglycemia: Secondary | ICD-10-CM | POA: Diagnosis not present

## 2020-05-23 DIAGNOSIS — Z85038 Personal history of other malignant neoplasm of large intestine: Secondary | ICD-10-CM | POA: Diagnosis not present

## 2020-05-23 DIAGNOSIS — Z932 Ileostomy status: Secondary | ICD-10-CM | POA: Diagnosis not present

## 2020-05-23 DIAGNOSIS — R109 Unspecified abdominal pain: Secondary | ICD-10-CM | POA: Diagnosis not present

## 2020-05-23 DIAGNOSIS — Z79899 Other long term (current) drug therapy: Secondary | ICD-10-CM | POA: Diagnosis not present

## 2020-05-23 DIAGNOSIS — I4891 Unspecified atrial fibrillation: Secondary | ICD-10-CM | POA: Diagnosis not present

## 2020-05-23 DIAGNOSIS — Z79811 Long term (current) use of aromatase inhibitors: Secondary | ICD-10-CM | POA: Diagnosis not present

## 2020-05-23 DIAGNOSIS — K760 Fatty (change of) liver, not elsewhere classified: Secondary | ICD-10-CM | POA: Diagnosis not present

## 2020-05-23 DIAGNOSIS — F419 Anxiety disorder, unspecified: Secondary | ICD-10-CM | POA: Diagnosis not present

## 2020-05-23 DIAGNOSIS — F69 Unspecified disorder of adult personality and behavior: Secondary | ICD-10-CM | POA: Diagnosis not present

## 2020-05-23 DIAGNOSIS — F329 Major depressive disorder, single episode, unspecified: Secondary | ICD-10-CM | POA: Diagnosis not present

## 2020-05-23 DIAGNOSIS — R4182 Altered mental status, unspecified: Secondary | ICD-10-CM | POA: Diagnosis not present

## 2020-05-29 ENCOUNTER — Ambulatory Visit (INDEPENDENT_AMBULATORY_CARE_PROVIDER_SITE_OTHER): Payer: Medicare Other | Admitting: Sports Medicine

## 2020-05-29 ENCOUNTER — Encounter: Payer: Self-pay | Admitting: Sports Medicine

## 2020-05-29 ENCOUNTER — Other Ambulatory Visit: Payer: Self-pay

## 2020-05-29 DIAGNOSIS — L97912 Non-pressure chronic ulcer of unspecified part of right lower leg with fat layer exposed: Secondary | ICD-10-CM | POA: Diagnosis not present

## 2020-05-29 DIAGNOSIS — F0150 Vascular dementia without behavioral disturbance: Secondary | ICD-10-CM

## 2020-05-29 DIAGNOSIS — E1142 Type 2 diabetes mellitus with diabetic polyneuropathy: Secondary | ICD-10-CM | POA: Diagnosis not present

## 2020-05-29 DIAGNOSIS — Z9889 Other specified postprocedural states: Secondary | ICD-10-CM

## 2020-05-29 NOTE — Progress Notes (Addendum)
Subjective: Cindy Hahn is a 86 y.o. female patient seen today in office for follow-up wound care patient has previous history of Achilles I&D on the right according to daughter the area is healing well no longer has home nurse she is changing the dressings however she fell about 3 weeks ago and bumped the right great toe and remove the nail by accident.  Daughter has been applying Betadine and dry dressing and it seems to be improving.  Patient Active Problem List   Diagnosis Date Noted  . Hyperlipidemia 02/01/2020  . Subdural hematoma ([place]) 02/01/2020  . Osteomyelitis ([place]) 10/17/2018  . Diabetic foot ulcer ([place]) 05/05/2018  . History of CVA (cerebrovascular accident) 05/05/2018  . Dementia ([place]) 05/05/2018  . Diabetic polyneuropathy associated with type 2 diabetes mellitus ([place]) 06/04/2015  . Pacemaker 03/11/2015  . Bradycardia 02/06/2015  . Chronic atrial fibrillation ([place]) 11/01/2014  . Essential hypertension 11/01/2014  . Diabetes mellitus ([place]) 11/01/2014    Current Outpatient Medications on File Prior to Visit  Medication Sig Dispense Refill  . acetaminophen (TYLENOL) 325 MG tablet Take by mouth.    . ALPRAZolam (XANAX) 0.25 MG tablet     . ALPRAZolam (XANAX) 1 MG tablet Take 1 mg by mouth.    Marland Kitchen ampicillin (PRINCIPEN) 500 MG capsule     . aspirin EC 81 MG tablet Take 81 mg by mouth.    . BD INSULIN SYRINGE U/F 31G X 5/16" 1 ML MISC     . cefTRIAXone (ROCEPHIN) 1 g injection     . cefUROXime (CEFTIN) 250 MG tablet     . Cholecalciferol 125 MCG (5000 UT) TABS Take by mouth.    . collagenase (SANTYL) ointment Apply 1 application topically daily. 30 g 0  . Cranberry-Vitamin C-Inulin (UTI-STAT) LIQD Take by mouth.    . digoxin (LANOXIN) 0.125 MG tablet Take 0.0625 mg by mouth daily.   0  . diltiazem (CARDIZEM) 30 MG tablet     . diltiazem (CARDIZEM) 90 MG tablet Take 1 tablet (90 mg total) by mouth 2 (two) times daily. Please make overdue appt with Dr. Curt Bears before  anymore refills. 1st attempt 60 tablet 0  . doxycycline (VIBRA-TABS) 100 MG tablet Take 1 tablet (100 mg total) by mouth 2 (two) times daily. 20 tablet 0  . doxycycline (VIBRAMYCIN) 100 MG capsule     . fluconazole (DIFLUCAN) 100 MG tablet     . fluconazole (DIFLUCAN) 150 MG tablet Take 1 tablet (150 mg total) by mouth daily. 10 tablet 1  . gabapentin (NEURONTIN) 100 MG capsule Take 1 capsule (100 mg total) by mouth at bedtime. 30 capsule 3  . insulin regular (NOVOLIN R) 100 units/mL injection Inject into the skin.    Marland Kitchen LEVEMIR 100 UNIT/ML injection Inject 74 Units into the skin.     Marland Kitchen lidocaine (XYLOCAINE) 1 % (with preservative) injection     . metoprolol tartrate (LOPRESSOR) 100 MG tablet Take 1 tablet (100 mg total) by mouth 2 (two) times daily. 180 tablet 3  . Multiple Vitamin (QUINTABS) TABS Take 1 tablet by mouth daily.    . naproxen sodium (ALEVE) 220 MG tablet Take 220 mg by mouth daily.    . nitrofurantoin, macrocrystal-monohydrate, (MACROBID) 100 MG capsule     . NOVOLOG FLEXPEN 100 UNIT/ML FlexPen inject 10 units SUBCUTANEOUSLY WITH MAIN MEAL  0  . NYAMYC powder     . nystatin cream (MYCOSTATIN)     . potassium chloride SA (K-DUR,KLOR-CON) 20 MEQ tablet Take  20 mEq by mouth daily.   0  . rosuvastatin (CRESTOR) 20 MG tablet   0  . sertraline (ZOLOFT) 100 MG tablet     . sertraline (ZOLOFT) 50 MG tablet     . silver nitrate applicators 57-01 % applicator Apply to wound every other day then cover with guaze and papertape 100 each 0  . sulfamethoxazole-trimethoprim (BACTRIM) 400-80 MG tablet Take 1 tablet by mouth 2 (two) times daily. 28 tablet 0   No current facility-administered medications on file prior to visit.    Allergies  Allergen Reactions  . Levofloxacin Other (See Comments)    confusion    Objective: There were no vitals filed for this visit.  General: No acute distress, AAOx3  Right posterior heel/leg, Wound/ dehiscence at surgical site centrally that measures  1.5 x 1.2 x0.3cm with granular base mild rolled edges, No warmth, mild bloody drainage, mild swelling to right posterior heel/leg, no other signs of infection noted to this area.  Nails x 10 are short and thickened with dried blood at the right hallux toenail bed with no open lesion or signs of infection noted.  Preulcerative callus noted submet 5 and lateral fifth metatarsal base/styloid process on right with no signs of infection  Preulcerative callus noted submet 1 on left with no signs of infection  Preulcerative callus submet 5 on left and heel that remains healed with no re-opening.  Capillary fill time <3 seconds in all digits, gross sensation present via light touch bilateral + mild pain to right leg.  No pain with calf compression.  Assessment and Plan:  Problem List Items Addressed This Visit      Endocrine   Diabetic polyneuropathy associated with type 2 diabetes mellitus ([place])     Nervous and Auditory   Dementia ([place])    Other Visit Diagnoses    Leg ulcer, right, with fat layer exposed ([place])    -  Primary   S/P foot surgery, right          -Patient seen and evaluated -Encourage patient to try range of motion for any stiffness in her feet or legs -Daughter may continue with using Betadine and Band-Aid to right great toe until scab has fallen off -Mechanically debrided callus x 4 using sterile chisel blade without incident and nails using a sterile nail nipper without incident -To right Achilles ulcer very minimal debridement performed today using a saline moistened gauze and a dermal curette debrided selectively the margins to stimulate the wound through the dermal layer with minimal bleeding noted.  Patient tolerated debridement well without need for anesthesia -Applied foot miracle to all the preulcerative calluses -Applied wet to dry and mepliex border dressing to right lower leg at Achilles and advised daughter to return to using Santyl to the area apply every other  day -Continue with wheelchair for stability -Continue with good supportive shoes for foot type and encourage patient to wear shoes like before and to refrain from walking around barefeet -Return to office in 3-4 weeks or sooner if problems arise.   Landis Martins, DPM

## 2020-06-04 DIAGNOSIS — F0281 Dementia in other diseases classified elsewhere with behavioral disturbance: Secondary | ICD-10-CM | POA: Diagnosis not present

## 2020-06-04 DIAGNOSIS — G3183 Dementia with Lewy bodies: Secondary | ICD-10-CM | POA: Diagnosis not present

## 2020-06-11 DIAGNOSIS — N390 Urinary tract infection, site not specified: Secondary | ICD-10-CM | POA: Diagnosis not present

## 2020-06-11 DIAGNOSIS — N281 Cyst of kidney, acquired: Secondary | ICD-10-CM | POA: Diagnosis not present

## 2020-06-11 DIAGNOSIS — N2889 Other specified disorders of kidney and ureter: Secondary | ICD-10-CM | POA: Diagnosis not present

## 2020-06-28 ENCOUNTER — Encounter: Payer: Self-pay | Admitting: Sports Medicine

## 2020-06-28 ENCOUNTER — Ambulatory Visit (INDEPENDENT_AMBULATORY_CARE_PROVIDER_SITE_OTHER): Payer: Medicare Other | Admitting: Sports Medicine

## 2020-06-28 ENCOUNTER — Other Ambulatory Visit: Payer: Self-pay

## 2020-06-28 DIAGNOSIS — L97501 Non-pressure chronic ulcer of other part of unspecified foot limited to breakdown of skin: Secondary | ICD-10-CM

## 2020-06-28 DIAGNOSIS — L97912 Non-pressure chronic ulcer of unspecified part of right lower leg with fat layer exposed: Secondary | ICD-10-CM

## 2020-06-28 DIAGNOSIS — E1142 Type 2 diabetes mellitus with diabetic polyneuropathy: Secondary | ICD-10-CM

## 2020-06-28 DIAGNOSIS — Z9889 Other specified postprocedural states: Secondary | ICD-10-CM | POA: Diagnosis not present

## 2020-06-28 DIAGNOSIS — F0150 Vascular dementia without behavioral disturbance: Secondary | ICD-10-CM | POA: Diagnosis not present

## 2020-06-28 NOTE — Progress Notes (Signed)
Subjective: KHALEESI GRUEL is a 86 y.o. female patient seen today in office for follow-up wound care patient has previous history of Achilles I&D on the right according to daughter doing ok. Daughter has been applying santyl to the area and hasn't been applying anything to any of the other areas. No other issues noted.   Patient Active Problem List   Diagnosis Date Noted  . Hyperlipidemia 02/01/2020  . Subdural hematoma ([place]) 02/01/2020  . Osteomyelitis ([place]) 10/17/2018  . Diabetic foot ulcer ([place]) 05/05/2018  . History of CVA (cerebrovascular accident) 05/05/2018  . Dementia ([place]) 05/05/2018  . Diabetic polyneuropathy associated with type 2 diabetes mellitus ([place]) 06/04/2015  . Pacemaker 03/11/2015  . Bradycardia 02/06/2015  . Chronic atrial fibrillation ([place]) 11/01/2014  . Essential hypertension 11/01/2014  . Diabetes mellitus ([place]) 11/01/2014    Current Outpatient Medications on File Prior to Visit  Medication Sig Dispense Refill  . donepezil (ARICEPT) 5 MG tablet Take by mouth.    . memantine (NAMENDA) 5 MG tablet Take by mouth.    Marland Kitchen acetaminophen (TYLENOL) 325 MG tablet Take by mouth.    . ALPRAZolam (XANAX) 0.25 MG tablet     . ALPRAZolam (XANAX) 1 MG tablet Take 1 mg by mouth.    Marland Kitchen ampicillin (PRINCIPEN) 500 MG capsule     . aspirin EC 81 MG tablet Take 81 mg by mouth.    . BD INSULIN SYRINGE U/F 31G X 5/16" 1 ML MISC     . cefTRIAXone (ROCEPHIN) 1 g injection     . cefUROXime (CEFTIN) 250 MG tablet     . Cholecalciferol 125 MCG (5000 UT) TABS Take by mouth.    . collagenase (SANTYL) ointment Apply 1 application topically daily. 30 g 0  . Cranberry-Vitamin C-Inulin (UTI-STAT) LIQD Take by mouth.    . digoxin (LANOXIN) 0.125 MG tablet Take 0.0625 mg by mouth daily.   0  . diltiazem (CARDIZEM) 30 MG tablet     . diltiazem (CARDIZEM) 90 MG tablet Take 1 tablet (90 mg total) by mouth 2 (two) times daily. Please make overdue appt with Dr. Curt Bears before anymore refills. 1st  attempt 60 tablet 0  . doxycycline (VIBRA-TABS) 100 MG tablet Take 1 tablet (100 mg total) by mouth 2 (two) times daily. 20 tablet 0  . doxycycline (VIBRAMYCIN) 100 MG capsule     . fluconazole (DIFLUCAN) 100 MG tablet     . fluconazole (DIFLUCAN) 150 MG tablet Take 1 tablet (150 mg total) by mouth daily. 10 tablet 1  . gabapentin (NEURONTIN) 100 MG capsule Take 1 capsule (100 mg total) by mouth at bedtime. 30 capsule 3  . insulin glargine (LANTUS) 100 UNIT/ML injection Inject into the skin.    Marland Kitchen insulin regular (NOVOLIN R) 100 units/mL injection Inject into the skin.    Marland Kitchen LEVEMIR 100 UNIT/ML injection Inject 74 Units into the skin.     Marland Kitchen lidocaine (XYLOCAINE) 1 % (with preservative) injection     . metoprolol tartrate (LOPRESSOR) 100 MG tablet Take 1 tablet (100 mg total) by mouth 2 (two) times daily. 180 tablet 3  . Multiple Vitamin (QUINTABS) TABS Take 1 tablet by mouth daily.    . naproxen sodium (ALEVE) 220 MG tablet Take 220 mg by mouth daily.    . nitrofurantoin, macrocrystal-monohydrate, (MACROBID) 100 MG capsule     . NOVOLOG FLEXPEN 100 UNIT/ML FlexPen inject 10 units SUBCUTANEOUSLY WITH MAIN MEAL  0  . NYAMYC powder     . nystatin  cream (MYCOSTATIN)     . potassium chloride SA (K-DUR,KLOR-CON) 20 MEQ tablet Take 20 mEq by mouth daily.   0  . rosuvastatin (CRESTOR) 20 MG tablet   0  . sertraline (ZOLOFT) 100 MG tablet     . sertraline (ZOLOFT) 50 MG tablet     . silver nitrate applicators 66-06 % applicator Apply to wound every other day then cover with guaze and papertape 100 each 0  . sulfamethoxazole-trimethoprim (BACTRIM) 400-80 MG tablet Take 1 tablet by mouth 2 (two) times daily. 28 tablet 0   No current facility-administered medications on file prior to visit.    Allergies  Allergen Reactions  . Levofloxacin Other (See Comments)    confusion    Objective: There were no vitals filed for this visit.  General: No acute distress, AAOx3  Right posterior heel/leg,  Wound/ dehiscence at surgical site centrally that measures 1.4 x 1.0 x0.3cm with granular base mild rolled edges, No warmth, mild bloody drainage, mild swelling to right posterior heel/leg, no other signs of infection noted to this area.  Right sub met 1 pre-ulcerative callus with a pinpoint opening with no acute signs of infection.  Preulcerative callus noted submet 5 and lateral fifth metatarsal base/styloid process on right with no signs of infection   Left sub met 5 less than 0.5cm with granular base and soft callus skin surrounding the area with no redness, warmth, + bloody drainage.  Preulcerative callus left heel that remains healed with no re-opening.  Nails x 10 are short and thickened with dried blood at the right hallux toenail bed with no open lesion or signs of infection noted.  Capillary fill time <3 seconds in all digits, gross sensation present via light touch bilateral + mild pain to right leg.  No pain with calf compression.  Assessment and Plan:  Problem List Items Addressed This Visit      Endocrine   Diabetic polyneuropathy associated with type 2 diabetes mellitus ([facility])   Relevant Medications   donepezil (ARICEPT) 5 MG tablet   insulin glargine (LANTUS) 100 UNIT/ML injection   memantine (NAMENDA) 5 MG tablet     Nervous and Auditory   Dementia ([facility])   Relevant Medications   donepezil (ARICEPT) 5 MG tablet   memantine (NAMENDA) 5 MG tablet    Other Visit Diagnoses    Leg ulcer, right, with fat layer exposed ([place])    -  Primary   Ulcer of foot, limited to breakdown of skin, unspecified laterality ([facility])       S/P foot surgery, right          -Patient seen and evaluated -Mechanically debrided callus x 2 and ulcerations using sterile chisel blade without incident and nails using a sterile nail nipper without incident again this visit at no char -To right Achilles ulcer very minimal debridement performed today using a saline moistened gauze and a dermal curette  debrided selectively the margins to stimulate the wound through the dermal layer with minimal bleeding noted.  Patient tolerated debridement well without need for anesthesia -Applied wet to dry and mepliex border dressing to right lower leg at Achilles and iodosorb sub met 1 on right and sub met 5 on left -Continue with wheelchair or walker for stability -Continue with good supportive shoes  like before and to refrain from walking around barefoot as best as possible but patient suffers from dementia  -Return to office in 4-6 weeks or sooner if problems arise.   Landis Martins, DPM

## 2020-07-15 ENCOUNTER — Ambulatory Visit (INDEPENDENT_AMBULATORY_CARE_PROVIDER_SITE_OTHER): Payer: Medicare Other

## 2020-07-15 DIAGNOSIS — I482 Chronic atrial fibrillation, unspecified: Secondary | ICD-10-CM

## 2020-07-17 LAB — CUP PACEART REMOTE DEVICE CHECK
Battery Impedance: 1219 Ohm
Battery Remaining Longevity: 59 mo
Battery Voltage: 2.76 V
Brady Statistic RV Percent Paced: 15 %
Date Time Interrogation Session: 20220329142816
Implantable Lead Implant Date: 20161027
Implantable Lead Location: 753860
Implantable Lead Model: 4092
Implantable Pulse Generator Implant Date: 20161027
Lead Channel Impedance Value: 0 Ohm
Lead Channel Impedance Value: 605 Ohm
Lead Channel Pacing Threshold Amplitude: 0.5 V
Lead Channel Pacing Threshold Pulse Width: 0.4 ms
Lead Channel Setting Pacing Amplitude: 2 V
Lead Channel Setting Pacing Pulse Width: 0.4 ms
Lead Channel Setting Sensing Sensitivity: 2 mV

## 2020-07-23 DIAGNOSIS — G3183 Dementia with Lewy bodies: Secondary | ICD-10-CM | POA: Diagnosis not present

## 2020-07-23 DIAGNOSIS — F0281 Dementia in other diseases classified elsewhere with behavioral disturbance: Secondary | ICD-10-CM | POA: Diagnosis not present

## 2020-07-24 ENCOUNTER — Other Ambulatory Visit: Payer: Self-pay | Admitting: Sports Medicine

## 2020-07-24 MED ORDER — FLUCONAZOLE 150 MG PO TABS: 150.0000 mg | ORAL_TABLET | Freq: Every day | ORAL | 1 refills | Status: DC

## 2020-07-24 MED ORDER — SULFAMETHOXAZOLE-TRIMETHOPRIM 400-80 MG PO TABS: 1.0000 | ORAL_TABLET | Freq: Two times a day (BID) | ORAL | 0 refills | Status: DC

## 2020-07-24 NOTE — Progress Notes (Signed)
Sent bactrim for new blister near wound and redness

## 2020-07-26 NOTE — Progress Notes (Signed)
Remote pacemaker transmission.   

## 2020-07-30 ENCOUNTER — Encounter: Payer: Self-pay | Admitting: Sports Medicine

## 2020-07-30 ENCOUNTER — Other Ambulatory Visit: Payer: Self-pay

## 2020-07-30 ENCOUNTER — Ambulatory Visit (INDEPENDENT_AMBULATORY_CARE_PROVIDER_SITE_OTHER): Payer: Medicare Other | Admitting: Sports Medicine

## 2020-07-30 DIAGNOSIS — F0150 Vascular dementia without behavioral disturbance: Secondary | ICD-10-CM

## 2020-07-30 DIAGNOSIS — L97912 Non-pressure chronic ulcer of unspecified part of right lower leg with fat layer exposed: Secondary | ICD-10-CM | POA: Diagnosis not present

## 2020-07-30 DIAGNOSIS — L84 Corns and callosities: Secondary | ICD-10-CM

## 2020-07-30 DIAGNOSIS — L97501 Non-pressure chronic ulcer of other part of unspecified foot limited to breakdown of skin: Secondary | ICD-10-CM

## 2020-07-30 DIAGNOSIS — E1142 Type 2 diabetes mellitus with diabetic polyneuropathy: Secondary | ICD-10-CM

## 2020-07-30 NOTE — Progress Notes (Signed)
Subjective: Cindy Hahn is a 86 y.o. female patient seen today in office for follow-up wound care patient has previous history of Achilles I&D on the right according to daughter doing a little worse have some calluses that need to be trimmed but then also has some darkness to her right heel that looks like dried blood underneath the callus area and to her big toe joint.  Daughter has been helping with wound care.  No other issues noted.   Patient Active Problem List   Diagnosis Date Noted  . Lewy body dementia ([place]) 04/24/2020  . Hyperlipidemia 02/01/2020  . Subdural hematoma ([place]) 02/01/2020  . Osteomyelitis ([place]) 10/17/2018  . Diabetic foot ulcer ([place]) 05/05/2018  . History of CVA (cerebrovascular accident) 05/05/2018  . Dementia ([place]) 05/05/2018  . Diabetic polyneuropathy associated with type 2 diabetes mellitus ([place]) 06/04/2015  . Pacemaker 03/11/2015  . Bradycardia 02/06/2015  . Chronic atrial fibrillation ([place]) 11/01/2014  . Essential hypertension 11/01/2014  . Diabetes mellitus ([place]) 11/01/2014    Current Outpatient Medications on File Prior to Visit  Medication Sig Dispense Refill  . acetaminophen (TYLENOL) 325 MG tablet Take by mouth.    . ALPRAZolam (XANAX) 0.25 MG tablet     . ALPRAZolam (XANAX) 1 MG tablet Take 1 mg by mouth.    Marland Kitchen ampicillin (PRINCIPEN) 500 MG capsule     . aspirin EC 81 MG tablet Take 81 mg by mouth.    . BD INSULIN SYRINGE U/F 31G X 5/16" 1 ML MISC     . cefTRIAXone (ROCEPHIN) 1 g injection     . cefUROXime (CEFTIN) 250 MG tablet     . Cholecalciferol 125 MCG (5000 UT) TABS Take by mouth.    . collagenase (SANTYL) ointment Apply 1 application topically daily. 30 g 0  . Cranberry-Vitamin C-Inulin (UTI-STAT) LIQD Take by mouth.    . digoxin (LANOXIN) 0.125 MG tablet Take 0.0625 mg by mouth daily.   0  . diltiazem (CARDIZEM) 30 MG tablet     . diltiazem (CARDIZEM) 90 MG tablet Take 1 tablet (90 mg total) by mouth 2 (two) times daily. Please make  overdue appt with Dr. Curt Bears before anymore refills. 1st attempt 60 tablet 0  . donepezil (ARICEPT) 5 MG tablet Take by mouth.    . doxycycline (VIBRA-TABS) 100 MG tablet Take 1 tablet (100 mg total) by mouth 2 (two) times daily. 20 tablet 0  . doxycycline (VIBRAMYCIN) 100 MG capsule     . fluconazole (DIFLUCAN) 150 MG tablet Take 1 tablet (150 mg total) by mouth daily. 10 tablet 1  . gabapentin (NEURONTIN) 100 MG capsule Take 1 capsule (100 mg total) by mouth at bedtime. 30 capsule 3  . insulin glargine (LANTUS) 100 UNIT/ML injection Inject into the skin.    Marland Kitchen insulin regular (NOVOLIN R) 100 units/mL injection Inject into the skin.    Marland Kitchen LEVEMIR 100 UNIT/ML injection Inject 74 Units into the skin.     Marland Kitchen lidocaine (XYLOCAINE) 1 % (with preservative) injection     . memantine (NAMENDA) 5 MG tablet Take by mouth.    . metoprolol tartrate (LOPRESSOR) 100 MG tablet Take 1 tablet (100 mg total) by mouth 2 (two) times daily. 180 tablet 3  . Multiple Vitamin (QUINTABS) TABS Take 1 tablet by mouth daily.    . naproxen sodium (ALEVE) 220 MG tablet Take 220 mg by mouth daily.    . nitrofurantoin, macrocrystal-monohydrate, (MACROBID) 100 MG capsule     . NOVOLOG FLEXPEN  100 UNIT/ML FlexPen inject 10 units SUBCUTANEOUSLY WITH MAIN MEAL  0  . NYAMYC powder     . nystatin cream (MYCOSTATIN)     . potassium chloride SA (K-DUR,KLOR-CON) 20 MEQ tablet Take 20 mEq by mouth daily.   0  . risperiDONE (RISPERDAL) 0.5 MG tablet     . rosuvastatin (CRESTOR) 20 MG tablet   0  . sertraline (ZOLOFT) 100 MG tablet     . sertraline (ZOLOFT) 50 MG tablet     . silver nitrate applicators 67-12 % applicator Apply to wound every other day then cover with guaze and papertape 100 each 0  . sulfamethoxazole-trimethoprim (BACTRIM) 400-80 MG tablet Take 1 tablet by mouth 2 (two) times daily. 28 tablet 0   No current facility-administered medications on file prior to visit.    Allergies  Allergen Reactions  . Levofloxacin  Other (See Comments)    confusion    Objective: There were no vitals filed for this visit.  General: No acute distress, AAOx3  Right posterior heel/leg, Wound/ dehiscence at surgical site centrally that measures 1.0 x 0.9 x0.3cm with granular base mild rolled edges, No warmth, mild bloody drainage, mild swelling to right posterior heel/leg, no other signs of infection noted to this area.  Right sub met 1 there is a full-thickness ulceration that measures 0.3 x 0.2 with an adjacent blister that measures 0.2 x 0.2 cm partial-thickness in nature with a granular base and keratotic borders upon debridement there was maceration at these borders.  Preulcerative callus noted submet 5 and lateral fifth metatarsal base/styloid process on right with no signs of infection   Left sub met 5 less than 0.5cm with granular base and soft callus skin surrounding the area with no acute signs of infection.  Left heel ulceration measures 0.5 x 0.3 centimeters with a granular base appears to be partial-thickness in nature no significant redness swelling warmth or drainage.  Nails x 10 are short and thickened with dried blood at the right hallux toenail bed with no open lesion or signs of infection noted.  Capillary fill time <3 seconds in all digits, gross sensation present via light touch bilateral + mild pain to right leg.  No pain with calf compression.  Assessment and Plan:  Problem List Items Addressed This Visit      Endocrine   Diabetic polyneuropathy associated with type 2 diabetes mellitus ([place])   Relevant Medications   risperiDONE (RISPERDAL) 0.5 MG tablet     Nervous and Auditory   Dementia (HCC)   Relevant Medications   risperiDONE (RISPERDAL) 0.5 MG tablet    Other Visit Diagnoses    Leg ulcer, right, with fat layer exposed ([place])    -  Primary   Ulcer of foot, limited to breakdown of skin, unspecified laterality ([place])       Pre-ulcerative calluses          -Patient seen and  evaluated -Mechanically debrided callus x 1 and ulcerations using sterile chisel blade without incident -To right Achilles ulcer very minimal debridement performed today using a saline moistened gauze and a dermal curette debrided selectively the margins to stimulate the wound through the dermal layer with minimal bleeding noted.  Patient tolerated debridement well without need for anesthesia -Applied Betadine to the periphery and a dry dressing to the right Achilles and then applied antibiotic iodine ointment to all other plantar wounds covered with Band-Aids -Continue with good supportive shoes -Continue with walker -Continue with antibiotics as previously prescribed once  she has completed these antibiotics if symptoms recur or worsen daughter to call office for me to start her on Nuzyra antibiotic -Return to office in 2-3 weeks or sooner if problems arise.   Landis Martins, DPM

## 2020-08-08 ENCOUNTER — Telehealth: Payer: Self-pay | Admitting: Oncology

## 2020-08-08 NOTE — Telephone Encounter (Signed)
08/08/20 Spoke with daughter and she requested that her mother be seen post hospital d/c

## 2020-08-14 ENCOUNTER — Ambulatory Visit: Payer: Medicare Other | Admitting: Sports Medicine

## 2020-08-21 ENCOUNTER — Encounter: Payer: Self-pay | Admitting: Sports Medicine

## 2020-08-21 ENCOUNTER — Other Ambulatory Visit: Payer: Self-pay

## 2020-08-21 ENCOUNTER — Ambulatory Visit (INDEPENDENT_AMBULATORY_CARE_PROVIDER_SITE_OTHER): Payer: Medicare Other | Admitting: Sports Medicine

## 2020-08-21 DIAGNOSIS — F0150 Vascular dementia without behavioral disturbance: Secondary | ICD-10-CM

## 2020-08-21 DIAGNOSIS — L84 Corns and callosities: Secondary | ICD-10-CM

## 2020-08-21 DIAGNOSIS — E1142 Type 2 diabetes mellitus with diabetic polyneuropathy: Secondary | ICD-10-CM

## 2020-08-21 DIAGNOSIS — L97912 Non-pressure chronic ulcer of unspecified part of right lower leg with fat layer exposed: Secondary | ICD-10-CM

## 2020-08-21 DIAGNOSIS — L97501 Non-pressure chronic ulcer of other part of unspecified foot limited to breakdown of skin: Secondary | ICD-10-CM

## 2020-08-21 NOTE — Progress Notes (Signed)
Subjective: Cindy Hahn is a 86 y.o. female patient seen today in office for follow-up wound care patient has daughter helping with wound care who admits that she saw a blister submit 1 on the right foot has been using iodine to this area.  Daughter also reports that her mom has not been wearing the proper shoes as previously directed which she struggles with being compliant with due to her dementia.  Daughter also reports that they are evaluating her to go to [facility] for care.  No other issues noted.   Patient Active Problem List   Diagnosis Date Noted  . Lewy body dementia ([place]) 04/24/2020  . Hyperlipidemia 02/01/2020  . Subdural hematoma ([place]) 02/01/2020  . Osteomyelitis ([place]) 10/17/2018  . Diabetic foot ulcer ([place]) 05/05/2018  . History of CVA (cerebrovascular accident) 05/05/2018  . Dementia ([place]) 05/05/2018  . Diabetic polyneuropathy associated with type 2 diabetes mellitus ([place]) 06/04/2015  . Pacemaker 03/11/2015  . Bradycardia 02/06/2015  . Chronic atrial fibrillation ([place]) 11/01/2014  . Essential hypertension 11/01/2014  . Diabetes mellitus ([place]) 11/01/2014    Current Outpatient Medications on File Prior to Visit  Medication Sig Dispense Refill  . acetaminophen (TYLENOL) 325 MG tablet Take by mouth.    . ALPRAZolam (XANAX) 0.25 MG tablet     . ALPRAZolam (XANAX) 1 MG tablet Take 1 mg by mouth.    Marland Kitchen ampicillin (PRINCIPEN) 500 MG capsule     . aspirin EC 81 MG tablet Take 81 mg by mouth.    . BD INSULIN SYRINGE U/F 31G X 5/16" 1 ML MISC     . cefTRIAXone (ROCEPHIN) 1 g injection     . cefUROXime (CEFTIN) 250 MG tablet     . Cholecalciferol 125 MCG (5000 UT) TABS Take by mouth.    . collagenase (SANTYL) ointment Apply 1 application topically daily. 30 g 0  . Cranberry-Vitamin C-Inulin (UTI-STAT) LIQD Take by mouth.    . digoxin (LANOXIN) 0.125 MG tablet Take 0.0625 mg by mouth daily.   0  . diltiazem (CARDIZEM) 30 MG tablet     . diltiazem (CARDIZEM) 90 MG tablet Take  1 tablet (90 mg total) by mouth 2 (two) times daily. Please make overdue appt with Dr. Curt Bears before anymore refills. 1st attempt 60 tablet 0  . donepezil (ARICEPT) 5 MG tablet Take by mouth.    . doxycycline (VIBRA-TABS) 100 MG tablet Take 1 tablet (100 mg total) by mouth 2 (two) times daily. 20 tablet 0  . doxycycline (VIBRAMYCIN) 100 MG capsule     . fluconazole (DIFLUCAN) 150 MG tablet Take 1 tablet (150 mg total) by mouth daily. 10 tablet 1  . gabapentin (NEURONTIN) 100 MG capsule Take 1 capsule (100 mg total) by mouth at bedtime. 30 capsule 3  . insulin glargine (LANTUS) 100 UNIT/ML injection Inject into the skin.    Marland Kitchen insulin regular (NOVOLIN R) 100 units/mL injection Inject into the skin.    Marland Kitchen LEVEMIR 100 UNIT/ML injection Inject 74 Units into the skin.     Marland Kitchen lidocaine (XYLOCAINE) 1 % (with preservative) injection     . memantine (NAMENDA) 5 MG tablet Take by mouth.    . metoprolol tartrate (LOPRESSOR) 100 MG tablet Take 1 tablet (100 mg total) by mouth 2 (two) times daily. 180 tablet 3  . Multiple Vitamin (QUINTABS) TABS Take 1 tablet by mouth daily.    . naproxen sodium (ALEVE) 220 MG tablet Take 220 mg by mouth daily.    . nitrofurantoin,  macrocrystal-monohydrate, (MACROBID) 100 MG capsule     . NOVOLOG FLEXPEN 100 UNIT/ML FlexPen inject 10 units SUBCUTANEOUSLY WITH MAIN MEAL  0  . NYAMYC powder     . nystatin cream (MYCOSTATIN)     . potassium chloride SA (K-DUR,KLOR-CON) 20 MEQ tablet Take 20 mEq by mouth daily.   0  . risperiDONE (RISPERDAL) 0.5 MG tablet     . rosuvastatin (CRESTOR) 20 MG tablet   0  . sertraline (ZOLOFT) 100 MG tablet     . sertraline (ZOLOFT) 50 MG tablet     . silver nitrate applicators 01-75 % applicator Apply to wound every other day then cover with guaze and papertape 100 each 0  . sulfamethoxazole-trimethoprim (BACTRIM) 400-80 MG tablet Take 1 tablet by mouth 2 (two) times daily. 28 tablet 0   No current facility-administered medications on file prior  to visit.    Allergies  Allergen Reactions  . Levofloxacin Other (See Comments)    confusion    Objective: There were no vitals filed for this visit.  General: No acute distress, AAOx3  Right posterior heel/leg, Wound/ dehiscence at surgical site centrally that measures 0.7 x 0.8 x0.3cm with granular base mild rolled edges, No warmth, mild bloody drainage, mild swelling to right posterior heel/leg, no other signs of infection noted to this area.  Right sub met 1 there is a full-thickness ulceration that measures 1 x 1cm with an adjacent blister that measures 1 x 1 cm partial-thickness in nature with a granular base and keratotic borders upon debridement there was maceration at these borders.  Preulcerative callus noted submet 5 and lateral fifth metatarsal base/styloid process on right with no signs of infection   Left sub met 5 less than 0.1cm with granular base and soft callus skin surrounding the area with no acute signs of infection.  Left heel ulceration appears to be prematurely healed with no underlying opening no significant redness warmth swelling or drainage or signs of infection at this time.  Nails x 10 are short and thickened with no acute concerns at this time.  Capillary fill time <3 seconds in all digits, gross sensation present via light touch bilateral + mild pain to right leg and 2 prominent styloid process lateral left greater than right foot.  No pain with calf compression.  Assessment and Plan:  Problem List Items Addressed This Visit      Endocrine   Diabetic polyneuropathy associated with type 2 diabetes mellitus ([place])     Nervous and Auditory   Dementia ([place])    Other Visit Diagnoses    Leg ulcer, right, with fat layer exposed ([place])    -  Primary   Ulcer of foot, limited to breakdown of skin, unspecified laterality ([place])       Pre-ulcerative calluses          -Patient seen and evaluated -Mechanically debrided callus x 1 and ulcerations using  sterile chisel blade without incident -To right Achilles ulcer very minimal debridement performed today using a tissue nipper debrided selectively the margins to stimulate the wound through the dermal layer with minimal bleeding noted.  Patient tolerated debridement well without need for anesthesia -Applied Iodosorb and Band-Aid dressing to the right Achilles and then applied antibiotic iodine ointment to all other plantar wounds covered with Band-Aids -Continue with good supportive shoes -Continue with walker -Continue with [facility] for evaluation as scheduled on Friday and advised daughter that they can send a referral for additional care for her wounds -Return  to office in 2-3 weeks or sooner if problems arise.   Landis Martins, DPM

## 2020-08-26 NOTE — Progress Notes (Signed)
[facility]  [address] [place],  [place]  [address] [phone]  Clinic Day:  08/27/2020  Referring physician: Ronita Hipps, MD  This document serves as a record of services personally performed by Dequincy Macarthur Critchley, MD. It was created on their behalf by [facility], a trained medical scribe. The creation of this record is based on the scribe's personal observations and the provider's statements to them.  HISTORY OF PRESENT ILLNESS:  The patient is a 86 y.o. 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  She completed her 5 years of anastrozole for her adjuvant endocrine management last year.  She comes in today for routine follow up.  Since her last visit, the patient has been severe neuropsychiatric problems.  She has been diagnosed with Lewy body dementia.  She also has been very depressed and expresses suicidal ideation to where she needed psychiatric evaluation.  With respect to her breast cancer, she denies having any particular changes in her breasts which concern her for disease recurrence.  Her last annual mammogram in July 2021 showed no evidence of disease recurrence.  VITALS:  Blood pressure 121/75, pulse (!) 102, temperature 98.1 F (36.7 C), resp. rate 14, height 5\' 7"  (1.702 m), weight 133 lb 11.2 oz (60.6 kg), SpO2 99 %.  Wt Readings from Last 3 Encounters:  08/27/20 133 lb 11.2 oz (60.6 kg)  05/09/19 136 lb (61.7 kg)  01/16/19 135 lb (61.2 kg)    Body mass index is 20.94 kg/m.  Performance status (ECOG): 3  PHYSICAL EXAM:  Physical Exam Constitutional:      Appearance: Normal appearance.  HENT:     Mouth/Throat:     Pharynx: Oropharynx is clear. No oropharyngeal exudate.  Cardiovascular:     Rate and Rhythm: Normal rate and regular rhythm.     Heart sounds: No murmur heard. No friction rub. No gallop.   Pulmonary:     Breath sounds: Normal breath sounds.   Chest:  Breasts:     Right: Mass (2 cm mass in her lower outer quadrant, just beneath her lumpectomy scar) present. No swelling, bleeding, inverted nipple, nipple discharge, skin change, axillary adenopathy or supraclavicular adenopathy.     Left: No swelling, bleeding, inverted nipple, mass, nipple discharge, skin change, axillary adenopathy or supraclavicular adenopathy.    Abdominal:     General: Bowel sounds are normal. There is no distension.     Palpations: Abdomen is soft. There is no mass.     Tenderness: There is no abdominal tenderness.  Musculoskeletal:        General: No tenderness.     Cervical back: Normal range of motion and neck supple.     Right lower leg: No edema.     Left lower leg: No edema.  Lymphadenopathy:     Cervical: No cervical adenopathy.     Right cervical: No superficial, deep or posterior cervical adenopathy.    Left cervical: No superficial, deep or posterior cervical adenopathy.     Upper Body:     Right upper body: No supraclavicular or axillary adenopathy.     Left upper body: No supraclavicular or axillary adenopathy.     Lower Body: No right inguinal adenopathy. No left inguinal adenopathy.  Skin:    Coloration: Skin is not jaundiced.     Findings: No lesion or rash.  Neurological:     General: No focal deficit  present.     Mental Status: She is alert and oriented to person, place, and time. Mental status is at baseline.  Psychiatric:        Mood and Affect: Mood normal.        Behavior: Behavior normal.        Thought Content: Thought content normal.        Judgment: Judgment normal.    ASSESSMENT & PLAN:  Assessment/Plan:  An 86 year old woman with stage IA hormone positive breast cancer, who is nearly 7 years out from her lumpectomy.  She already completed her 5 years of adjuvant endocrine therapy with anastrozole.  Based upon her clinical breast exam today, the patient appears to have a mass in her lateral right breast.  I do believe it  represents postsurgical changes.   Normally, I would recommend a mammogram/ultrasound with potential biopsy of the area in question.  However, this patient's Lewy body dementia and associated neuropsychiatric problems are so prominent and debilitating that I do not believe she is in any shape to undergo such tests.  I also do not believe additional follow-ups here are particularly necessary when seeing how significant her cognitive decline is.  The patient's daughter voiced understanding of this.  They are free to contact our office if noticeable changes occur with her right breast to where re-evaluation needs to be reconsidered.   I, Rita Ohara, am acting as scribe for Marice Potter, MD    I have reviewed this report as typed by the medical scribe, and it is complete and accurate.  Dequincy Macarthur Critchley, MD

## 2020-08-27 ENCOUNTER — Other Ambulatory Visit: Payer: Self-pay

## 2020-08-27 ENCOUNTER — Inpatient Hospital Stay: Payer: Medicare Other | Attending: Oncology | Admitting: Oncology

## 2020-08-27 DIAGNOSIS — C50919 Malignant neoplasm of unspecified site of unspecified female breast: Secondary | ICD-10-CM | POA: Insufficient documentation

## 2020-08-27 DIAGNOSIS — C50511 Malignant neoplasm of lower-outer quadrant of right female breast: Secondary | ICD-10-CM

## 2020-08-27 DIAGNOSIS — Z170 Estrogen receptor positive status [ER+]: Secondary | ICD-10-CM

## 2020-08-29 DIAGNOSIS — F0391 Unspecified dementia with behavioral disturbance: Secondary | ICD-10-CM | POA: Diagnosis not present

## 2020-08-29 DIAGNOSIS — E1122 Type 2 diabetes mellitus with diabetic chronic kidney disease: Secondary | ICD-10-CM | POA: Diagnosis not present

## 2020-08-29 DIAGNOSIS — Z6820 Body mass index (BMI) 20.0-20.9, adult: Secondary | ICD-10-CM | POA: Diagnosis not present

## 2020-08-29 DIAGNOSIS — L97409 Non-pressure chronic ulcer of unspecified heel and midfoot with unspecified severity: Secondary | ICD-10-CM | POA: Diagnosis not present

## 2020-09-03 DIAGNOSIS — H90A21 Sensorineural hearing loss, unilateral, right ear, with restricted hearing on the contralateral side: Secondary | ICD-10-CM | POA: Diagnosis not present

## 2020-09-03 DIAGNOSIS — H9313 Tinnitus, bilateral: Secondary | ICD-10-CM | POA: Diagnosis not present

## 2020-09-03 DIAGNOSIS — H6123 Impacted cerumen, bilateral: Secondary | ICD-10-CM | POA: Diagnosis not present

## 2020-09-03 DIAGNOSIS — H90A32 Mixed conductive and sensorineural hearing loss, unilateral, left ear with restricted hearing on the contralateral side: Secondary | ICD-10-CM | POA: Diagnosis not present

## 2020-09-10 DIAGNOSIS — F0281 Dementia in other diseases classified elsewhere with behavioral disturbance: Secondary | ICD-10-CM | POA: Diagnosis present

## 2020-09-10 DIAGNOSIS — N183 Chronic kidney disease, stage 3 unspecified: Secondary | ICD-10-CM | POA: Diagnosis present

## 2020-09-10 DIAGNOSIS — Z8673 Personal history of transient ischemic attack (TIA), and cerebral infarction without residual deficits: Secondary | ICD-10-CM | POA: Diagnosis not present

## 2020-09-10 DIAGNOSIS — T461X5A Adverse effect of calcium-channel blockers, initial encounter: Secondary | ICD-10-CM | POA: Diagnosis present

## 2020-09-10 DIAGNOSIS — Z452 Encounter for adjustment and management of vascular access device: Secondary | ICD-10-CM | POA: Diagnosis not present

## 2020-09-10 DIAGNOSIS — Z853 Personal history of malignant neoplasm of breast: Secondary | ICD-10-CM | POA: Diagnosis not present

## 2020-09-10 DIAGNOSIS — N179 Acute kidney failure, unspecified: Secondary | ICD-10-CM | POA: Diagnosis not present

## 2020-09-10 DIAGNOSIS — B961 Klebsiella pneumoniae [K. pneumoniae] as the cause of diseases classified elsewhere: Secondary | ICD-10-CM | POA: Diagnosis present

## 2020-09-10 DIAGNOSIS — G3183 Dementia with Lewy bodies: Secondary | ICD-10-CM | POA: Diagnosis present

## 2020-09-10 DIAGNOSIS — E860 Dehydration: Secondary | ICD-10-CM | POA: Diagnosis present

## 2020-09-10 DIAGNOSIS — E1122 Type 2 diabetes mellitus with diabetic chronic kidney disease: Secondary | ICD-10-CM | POA: Diagnosis present

## 2020-09-10 DIAGNOSIS — Z794 Long term (current) use of insulin: Secondary | ICD-10-CM | POA: Diagnosis not present

## 2020-09-10 DIAGNOSIS — Z79899 Other long term (current) drug therapy: Secondary | ICD-10-CM | POA: Diagnosis not present

## 2020-09-10 DIAGNOSIS — R1084 Generalized abdominal pain: Secondary | ICD-10-CM | POA: Diagnosis not present

## 2020-09-10 DIAGNOSIS — T48205A Adverse effect of unspecified drugs acting on muscles, initial encounter: Secondary | ICD-10-CM | POA: Diagnosis present

## 2020-09-10 DIAGNOSIS — Z9181 History of falling: Secondary | ICD-10-CM | POA: Diagnosis not present

## 2020-09-10 DIAGNOSIS — G928 Other toxic encephalopathy: Secondary | ICD-10-CM | POA: Diagnosis not present

## 2020-09-10 DIAGNOSIS — I129 Hypertensive chronic kidney disease with stage 1 through stage 4 chronic kidney disease, or unspecified chronic kidney disease: Secondary | ICD-10-CM | POA: Diagnosis present

## 2020-09-10 DIAGNOSIS — R109 Unspecified abdominal pain: Secondary | ICD-10-CM | POA: Diagnosis not present

## 2020-09-10 DIAGNOSIS — Z7982 Long term (current) use of aspirin: Secondary | ICD-10-CM | POA: Diagnosis not present

## 2020-09-10 DIAGNOSIS — Z933 Colostomy status: Secondary | ICD-10-CM | POA: Diagnosis not present

## 2020-09-10 DIAGNOSIS — I4891 Unspecified atrial fibrillation: Secondary | ICD-10-CM | POA: Diagnosis not present

## 2020-09-10 DIAGNOSIS — C642 Malignant neoplasm of left kidney, except renal pelvis: Secondary | ICD-10-CM | POA: Diagnosis present

## 2020-09-10 DIAGNOSIS — M1990 Unspecified osteoarthritis, unspecified site: Secondary | ICD-10-CM | POA: Diagnosis present

## 2020-09-10 DIAGNOSIS — N390 Urinary tract infection, site not specified: Secondary | ICD-10-CM | POA: Diagnosis not present

## 2020-09-10 DIAGNOSIS — E785 Hyperlipidemia, unspecified: Secondary | ICD-10-CM | POA: Diagnosis present

## 2020-09-10 DIAGNOSIS — T424X5A Adverse effect of benzodiazepines, initial encounter: Secondary | ICD-10-CM | POA: Diagnosis present

## 2020-09-10 DIAGNOSIS — Z950 Presence of cardiac pacemaker: Secondary | ICD-10-CM | POA: Diagnosis not present

## 2020-09-10 DIAGNOSIS — R4182 Altered mental status, unspecified: Secondary | ICD-10-CM | POA: Diagnosis not present

## 2020-09-10 DIAGNOSIS — I482 Chronic atrial fibrillation, unspecified: Secondary | ICD-10-CM | POA: Diagnosis present

## 2020-09-13 DIAGNOSIS — I4891 Unspecified atrial fibrillation: Secondary | ICD-10-CM

## 2020-09-16 DIAGNOSIS — R4781 Slurred speech: Secondary | ICD-10-CM | POA: Diagnosis not present

## 2020-09-16 DIAGNOSIS — Z794 Long term (current) use of insulin: Secondary | ICD-10-CM | POA: Diagnosis not present

## 2020-09-16 DIAGNOSIS — R29704 NIHSS score 4: Secondary | ICD-10-CM | POA: Diagnosis present

## 2020-09-16 DIAGNOSIS — I482 Chronic atrial fibrillation, unspecified: Secondary | ICD-10-CM | POA: Diagnosis not present

## 2020-09-16 DIAGNOSIS — I517 Cardiomegaly: Secondary | ICD-10-CM | POA: Diagnosis not present

## 2020-09-16 DIAGNOSIS — G928 Other toxic encephalopathy: Secondary | ICD-10-CM | POA: Diagnosis not present

## 2020-09-16 DIAGNOSIS — R4182 Altered mental status, unspecified: Secondary | ICD-10-CM | POA: Diagnosis not present

## 2020-09-16 DIAGNOSIS — E1140 Type 2 diabetes mellitus with diabetic neuropathy, unspecified: Secondary | ICD-10-CM | POA: Diagnosis present

## 2020-09-16 DIAGNOSIS — R296 Repeated falls: Secondary | ICD-10-CM | POA: Diagnosis not present

## 2020-09-16 DIAGNOSIS — E785 Hyperlipidemia, unspecified: Secondary | ICD-10-CM | POA: Diagnosis present

## 2020-09-16 DIAGNOSIS — E11649 Type 2 diabetes mellitus with hypoglycemia without coma: Secondary | ICD-10-CM | POA: Diagnosis present

## 2020-09-16 DIAGNOSIS — I639 Cerebral infarction, unspecified: Secondary | ICD-10-CM | POA: Diagnosis not present

## 2020-09-16 DIAGNOSIS — R262 Difficulty in walking, not elsewhere classified: Secondary | ICD-10-CM | POA: Diagnosis not present

## 2020-09-16 DIAGNOSIS — F419 Anxiety disorder, unspecified: Secondary | ICD-10-CM | POA: Diagnosis present

## 2020-09-16 DIAGNOSIS — Z950 Presence of cardiac pacemaker: Secondary | ICD-10-CM | POA: Diagnosis not present

## 2020-09-16 DIAGNOSIS — Z9181 History of falling: Secondary | ICD-10-CM | POA: Diagnosis not present

## 2020-09-16 DIAGNOSIS — N183 Chronic kidney disease, stage 3 unspecified: Secondary | ICD-10-CM | POA: Diagnosis present

## 2020-09-16 DIAGNOSIS — M19071 Primary osteoarthritis, right ankle and foot: Secondary | ICD-10-CM | POA: Diagnosis not present

## 2020-09-16 DIAGNOSIS — I129 Hypertensive chronic kidney disease with stage 1 through stage 4 chronic kidney disease, or unspecified chronic kidney disease: Secondary | ICD-10-CM | POA: Diagnosis present

## 2020-09-16 DIAGNOSIS — Z5309 Procedure and treatment not carried out because of other contraindication: Secondary | ICD-10-CM | POA: Diagnosis present

## 2020-09-16 DIAGNOSIS — N179 Acute kidney failure, unspecified: Secondary | ICD-10-CM | POA: Diagnosis not present

## 2020-09-16 DIAGNOSIS — E1122 Type 2 diabetes mellitus with diabetic chronic kidney disease: Secondary | ICD-10-CM | POA: Diagnosis present

## 2020-09-16 DIAGNOSIS — J90 Pleural effusion, not elsewhere classified: Secondary | ICD-10-CM | POA: Diagnosis not present

## 2020-09-16 DIAGNOSIS — Z87440 Personal history of urinary (tract) infections: Secondary | ICD-10-CM | POA: Diagnosis not present

## 2020-09-16 DIAGNOSIS — R2689 Other abnormalities of gait and mobility: Secondary | ICD-10-CM | POA: Diagnosis present

## 2020-09-16 DIAGNOSIS — Z8673 Personal history of transient ischemic attack (TIA), and cerebral infarction without residual deficits: Secondary | ICD-10-CM | POA: Diagnosis not present

## 2020-09-16 DIAGNOSIS — G3183 Dementia with Lewy bodies: Secondary | ICD-10-CM | POA: Diagnosis not present

## 2020-09-16 DIAGNOSIS — I4891 Unspecified atrial fibrillation: Secondary | ICD-10-CM | POA: Diagnosis not present

## 2020-09-16 DIAGNOSIS — T361X5A Adverse effect of cephalosporins and other beta-lactam antibiotics, initial encounter: Secondary | ICD-10-CM | POA: Diagnosis present

## 2020-09-16 DIAGNOSIS — I361 Nonrheumatic tricuspid (valve) insufficiency: Secondary | ICD-10-CM | POA: Diagnosis not present

## 2020-09-16 DIAGNOSIS — R52 Pain, unspecified: Secondary | ICD-10-CM | POA: Diagnosis not present

## 2020-09-16 DIAGNOSIS — R9082 White matter disease, unspecified: Secondary | ICD-10-CM | POA: Diagnosis not present

## 2020-09-16 DIAGNOSIS — M2578 Osteophyte, vertebrae: Secondary | ICD-10-CM | POA: Diagnosis not present

## 2020-09-16 DIAGNOSIS — F0280 Dementia in other diseases classified elsewhere without behavioral disturbance: Secondary | ICD-10-CM | POA: Diagnosis present

## 2020-09-16 DIAGNOSIS — M549 Dorsalgia, unspecified: Secondary | ICD-10-CM | POA: Diagnosis not present

## 2020-09-16 DIAGNOSIS — R404 Transient alteration of awareness: Secondary | ICD-10-CM | POA: Diagnosis not present

## 2020-09-16 DIAGNOSIS — I340 Nonrheumatic mitral (valve) insufficiency: Secondary | ICD-10-CM | POA: Diagnosis not present

## 2020-09-17 ENCOUNTER — Telehealth: Payer: Self-pay | Admitting: Cardiology

## 2020-09-17 ENCOUNTER — Ambulatory Visit: Payer: Medicare Other | Admitting: Sports Medicine

## 2020-09-17 DIAGNOSIS — I361 Nonrheumatic tricuspid (valve) insufficiency: Secondary | ICD-10-CM

## 2020-09-17 DIAGNOSIS — I340 Nonrheumatic mitral (valve) insufficiency: Secondary | ICD-10-CM

## 2020-09-17 NOTE — Telephone Encounter (Signed)
Pt c/o medication issue:  1. Name of Medication:  metoprolol tartrate (LOPRESSOR) 100 MG tablet diltiazem (CARDIZEM) 90 MG tablet  2. How are you currently taking this medication (dosage and times per day)?   3. Are you having a reaction (difficulty breathing--STAT)?   4. What is your medication issue?   Dee with [facility] would like to know if the patient needs to be taking these medications. If so, she would like to confirm the dosages and instructions.

## 2020-09-19 NOTE — Telephone Encounter (Signed)
Left message for [facility] to return call.

## 2020-09-25 NOTE — Telephone Encounter (Signed)
Spoke to [place] with [facility]. She no longer has questions patient is no longer admitted. If something comes up she will let us know.

## 2020-10-04 ENCOUNTER — Other Ambulatory Visit: Payer: Self-pay

## 2020-10-04 ENCOUNTER — Ambulatory Visit (INDEPENDENT_AMBULATORY_CARE_PROVIDER_SITE_OTHER): Payer: No Typology Code available for payment source | Admitting: Sports Medicine

## 2020-10-04 ENCOUNTER — Encounter: Payer: Self-pay | Admitting: Sports Medicine

## 2020-10-04 DIAGNOSIS — L84 Corns and callosities: Secondary | ICD-10-CM | POA: Diagnosis not present

## 2020-10-04 DIAGNOSIS — E1142 Type 2 diabetes mellitus with diabetic polyneuropathy: Secondary | ICD-10-CM

## 2020-10-04 DIAGNOSIS — S90412A Abrasion, left great toe, initial encounter: Secondary | ICD-10-CM

## 2020-10-04 DIAGNOSIS — L97911 Non-pressure chronic ulcer of unspecified part of right lower leg limited to breakdown of skin: Secondary | ICD-10-CM

## 2020-10-04 NOTE — Progress Notes (Signed)
Subjective: Cindy Hahn is a 86 y.o. female patient seen today in office for follow-up wound care.  Patient reports that she is [facility] currently [facility].  Patient denies any constitutional symptoms.  Patient does not have her daughter at visit today.  Patient Active Problem List   Diagnosis Date Noted   Breast cancer in female [facility]) 08/27/2020   Lewy body dementia ([place]) 04/24/2020   Hyperlipidemia 02/01/2020   Subdural hematoma ([place]) 02/01/2020   Osteomyelitis ([place]) 10/17/2018   Diabetic foot ulcer ([place]) 05/05/2018   History of CVA (cerebrovascular accident) 05/05/2018   Dementia ([place]) 05/05/2018   Diabetic polyneuropathy associated with type 2 diabetes mellitus ([place]) 06/04/2015   Pacemaker 03/11/2015   Bradycardia 02/06/2015   Chronic atrial fibrillation ([place]) 11/01/2014   Essential hypertension 11/01/2014   Diabetes mellitus ([place]) 11/01/2014    Current Outpatient Medications on File Prior to Visit  Medication Sig Dispense Refill   ALPRAZolam (XANAX) 1 MG tablet Take by mouth.     anastrozole (ARIMIDEX) 1 MG tablet Take 1 tablet by mouth daily.     digoxin (LANOXIN) 0.125 MG tablet Take by mouth.     fluconazole (DIFLUCAN) 100 MG tablet      sertraline (ZOLOFT) 100 MG tablet Take 1 tablet by mouth daily.     acetaminophen (TYLENOL) 325 MG tablet Take by mouth.     ALPRAZolam (XANAX) 0.25 MG tablet      ALPRAZolam (XANAX) 1 MG tablet Take 1 mg by mouth.     ampicillin (PRINCIPEN) 500 MG capsule      aspirin 81 MG EC tablet Take by mouth.     aspirin EC 81 MG tablet Take 81 mg by mouth.     BD INSULIN SYRINGE U/F 31G X 5/16" 1 ML MISC      cefTRIAXone (ROCEPHIN) 1 g injection      cefUROXime (CEFTIN) 250 MG tablet      Cholecalciferol 125 MCG (5000 UT) TABS Take by mouth.     collagenase (SANTYL) ointment Apply 1 application topically daily. 30 g 0   Cranberry-Vitamin C-Inulin (UTI-STAT) LIQD Take by mouth.     digoxin (LANOXIN) 0.125 MG tablet Take 0.0625 mg by  mouth daily.   0   diltiazem (CARDIZEM) 30 MG tablet      diltiazem (CARDIZEM) 90 MG tablet Take 1 tablet (90 mg total) by mouth 2 (two) times daily. Please make overdue appt with Dr. Curt Bears before anymore refills. 1st attempt 60 tablet 0   donepezil (ARICEPT) 5 MG tablet Take by mouth.     doxycycline (VIBRA-TABS) 100 MG tablet Take 1 tablet (100 mg total) by mouth 2 (two) times daily. 20 tablet 0   doxycycline (VIBRAMYCIN) 100 MG capsule      fluconazole (DIFLUCAN) 150 MG tablet Take 1 tablet (150 mg total) by mouth daily. 10 tablet 1   gabapentin (NEURONTIN) 100 MG capsule Take 1 capsule (100 mg total) by mouth at bedtime. 30 capsule 3   insulin glargine (LANTUS) 100 UNIT/ML injection Inject into the skin.     insulin regular (NOVOLIN R) 100 units/mL injection Inject into the skin.     LEVEMIR 100 UNIT/ML injection Inject 74 Units into the skin.      lidocaine (XYLOCAINE) 1 % (with preservative) injection      memantine (NAMENDA) 5 MG tablet Take by mouth.     metoprolol tartrate (LOPRESSOR) 100 MG tablet Take 1 tablet (100 mg total) by mouth 2 (two) times daily.  180 tablet 3   Multiple Vitamin (QUINTABS) TABS Take 1 tablet by mouth daily.     naproxen sodium (ALEVE) 220 MG tablet Take 220 mg by mouth daily.     nitrofurantoin, macrocrystal-monohydrate, (MACROBID) 100 MG capsule      NOVOLOG FLEXPEN 100 UNIT/ML FlexPen inject 10 units SUBCUTANEOUSLY WITH MAIN MEAL  0   NYAMYC powder      nystatin cream (MYCOSTATIN)      potassium chloride SA (K-DUR,KLOR-CON) 20 MEQ tablet Take 20 mEq by mouth daily.   0   risperiDONE (RISPERDAL) 0.5 MG tablet      rosuvastatin (CRESTOR) 20 MG tablet   0   sertraline (ZOLOFT) 100 MG tablet      sertraline (ZOLOFT) 50 MG tablet      silver nitrate applicators 00-93 % applicator Apply to wound every other day then cover with guaze and papertape 100 each 0   sulfamethoxazole-trimethoprim (BACTRIM) 400-80 MG tablet Take 1 tablet by mouth 2 (two) times daily.  28 tablet 0   No current facility-administered medications on file prior to visit.    Allergies  Allergen Reactions   Levofloxacin Other (See Comments)    confusion   Oxycodone     Objective: There were no vitals filed for this visit.  General: No acute distress, AAOx3  Right posterior heel/leg, Wound now healed with mild reactive scarring  Right sub met 1 preulcerative callus with no opening or signs of infection  Preulcerative callus noted submet 5 and 1 and central heel on the left with no opening or signs of infection  Abrasion noted to the left hallux with no underlying opening or signs of infection.  Nails x 10 are mildly elongated and thickened consistent with onychomycosis.  Capillary fill time <3 seconds in all digits, gross sensation present via light touch bilateral.  No pain to palpation bilateral.  No pain with calf compression.  Assessment and Plan:  Problem List Items Addressed This Visit       Endocrine   Diabetic polyneuropathy associated with type 2 diabetes mellitus (HCC)   Relevant Medications   ALPRAZolam (XANAX) 1 MG tablet   aspirin 81 MG EC tablet   sertraline (ZOLOFT) 100 MG tablet   Other Visit Diagnoses     Leg ulcer, right, limited to breakdown of skin ([place])    -  Primary   Now healed   Pre-ulcerative calluses       Abrasion of left great toe, initial encounter           -Patient seen and evaluated -Mechanically debrided all preulcerative lesions/calluses using a sterile 15 blade with no underlying opening noted.  Protective Band-Aid replaced to abrasion at right hallux and advised patient to do the same to avoid rubbing or ulceration -Right leg ulcer is healed: Dressings no longer needed -At no additional charge mechanically debrided nails x10 using a sterile nail nipper without incident -Return to office in 8-9 weeks for callus care and final wound check or sooner if problems arise.   Landis Martins, DPM

## 2020-11-22 ENCOUNTER — Telehealth: Payer: Self-pay | Admitting: Sports Medicine

## 2020-11-22 NOTE — Telephone Encounter (Signed)
Pt's daughter called to say pt has cellulitis and [facility] has her on Keflex 500 mg bid x 10 days.  Pt has appt 12-06-20 for wound care/nail trim and daughter wanted to see if that was ok to wait.  If she still has pictures she will send them to you (she apparently has your # already).

## 2020-12-06 ENCOUNTER — Ambulatory Visit: Payer: PRIVATE HEALTH INSURANCE | Admitting: Sports Medicine

## 2020-12-18 ENCOUNTER — Encounter: Payer: Self-pay | Admitting: Sports Medicine

## 2020-12-18 ENCOUNTER — Other Ambulatory Visit: Payer: Self-pay

## 2020-12-18 ENCOUNTER — Ambulatory Visit (INDEPENDENT_AMBULATORY_CARE_PROVIDER_SITE_OTHER): Payer: No Typology Code available for payment source | Admitting: Sports Medicine

## 2020-12-18 DIAGNOSIS — E1142 Type 2 diabetes mellitus with diabetic polyneuropathy: Secondary | ICD-10-CM | POA: Diagnosis not present

## 2020-12-18 DIAGNOSIS — F0150 Vascular dementia without behavioral disturbance: Secondary | ICD-10-CM

## 2020-12-18 DIAGNOSIS — L97911 Non-pressure chronic ulcer of unspecified part of right lower leg limited to breakdown of skin: Secondary | ICD-10-CM

## 2020-12-18 DIAGNOSIS — L97421 Non-pressure chronic ulcer of left heel and midfoot limited to breakdown of skin: Secondary | ICD-10-CM | POA: Diagnosis not present

## 2020-12-18 DIAGNOSIS — L84 Corns and callosities: Secondary | ICD-10-CM

## 2020-12-18 NOTE — Progress Notes (Signed)
Subjective: Cindy Hahn is a 86 y.o. female patient seen today in office for follow-up wound care.  Patient reports that [facility] is enjoying [facility] and all the staff is very nice there.  Patient reports that she is making friends there and likes the physical therapy that she is doing.  Patient is assisted by daughter and states that because she is more active she thinks that this has caused the wound on the right Achilles area to open back up.  Daughter denies any significant redness warmth or swelling but states that she has been doing alginate dressing to the area.  Daughter also states that mom has new bleeding calluses to the left heel and is has a very difficult time with keeping any of the callused skin down and is wondering if there are things she can do to help with the callused skin on the bottom of each foot.  No other pedal complaints noted at this time.  Patient Active Problem List   Diagnosis Date Noted   Breast cancer in female [facility]) 08/27/2020   Lewy body dementia ([place]) 04/24/2020   Hyperlipidemia 02/01/2020   Subdural hematoma ([place]) 02/01/2020   Osteomyelitis ([place]) 10/17/2018   Diabetic foot ulcer ([place]) 05/05/2018   History of CVA (cerebrovascular accident) 05/05/2018   Dementia ([place]) 05/05/2018   Diabetic polyneuropathy associated with type 2 diabetes mellitus ([place]) 06/04/2015   Pacemaker 03/11/2015   Bradycardia 02/06/2015   Chronic atrial fibrillation ([place]) 11/01/2014   Essential hypertension 11/01/2014   Diabetes mellitus ([place]) 11/01/2014    Current Outpatient Medications on File Prior to Visit  Medication Sig Dispense Refill   acetaminophen (TYLENOL) 325 MG tablet Take by mouth.     ALPRAZolam (XANAX) 0.25 MG tablet      ALPRAZolam (XANAX) 1 MG tablet Take 1 mg by mouth.     ALPRAZolam (XANAX) 1 MG tablet Take by mouth.     ampicillin (PRINCIPEN) 500 MG capsule      anastrozole (ARIMIDEX) 1 MG tablet Take 1 tablet by mouth daily.     aspirin 81 MG EC tablet Take  by mouth.     aspirin EC 81 MG tablet Take 81 mg by mouth.     BD INSULIN SYRINGE U/F 31G X 5/16" 1 ML MISC      cefTRIAXone (ROCEPHIN) 1 g injection      cefUROXime (CEFTIN) 250 MG tablet      Cholecalciferol 125 MCG (5000 UT) TABS Take by mouth.     collagenase (SANTYL) ointment Apply 1 application topically daily. 30 g 0   Cranberry-Vitamin C-Inulin (UTI-STAT) LIQD Take by mouth.     digoxin (LANOXIN) 0.125 MG tablet Take 0.0625 mg by mouth daily.   0   digoxin (LANOXIN) 0.125 MG tablet Take by mouth.     diltiazem (CARDIZEM) 30 MG tablet      diltiazem (CARDIZEM) 90 MG tablet Take 1 tablet (90 mg total) by mouth 2 (two) times daily. Please make overdue appt with Dr. Curt Bears before anymore refills. 1st attempt 60 tablet 0   donepezil (ARICEPT) 5 MG tablet Take by mouth.     doxycycline (VIBRA-TABS) 100 MG tablet Take 1 tablet (100 mg total) by mouth 2 (two) times daily. 20 tablet 0   doxycycline (VIBRAMYCIN) 100 MG capsule      fluconazole (DIFLUCAN) 100 MG tablet      fluconazole (DIFLUCAN) 150 MG tablet Take 1 tablet (150 mg total) by mouth daily. 10 tablet 1   gabapentin (NEURONTIN)  100 MG capsule Take 1 capsule (100 mg total) by mouth at bedtime. 30 capsule 3   insulin glargine (LANTUS) 100 UNIT/ML injection Inject into the skin.     insulin regular (NOVOLIN R) 100 units/mL injection Inject into the skin.     LEVEMIR 100 UNIT/ML injection Inject 74 Units into the skin.      lidocaine (XYLOCAINE) 1 % (with preservative) injection      memantine (NAMENDA) 5 MG tablet Take by mouth.     metoprolol tartrate (LOPRESSOR) 100 MG tablet Take 1 tablet (100 mg total) by mouth 2 (two) times daily. 180 tablet 3   Multiple Vitamin (QUINTABS) TABS Take 1 tablet by mouth daily.     naproxen sodium (ALEVE) 220 MG tablet Take 220 mg by mouth daily.     nitrofurantoin, macrocrystal-monohydrate, (MACROBID) 100 MG capsule      NOVOLOG FLEXPEN 100 UNIT/ML FlexPen inject 10 units SUBCUTANEOUSLY WITH MAIN  MEAL  0   NYAMYC powder      nystatin cream (MYCOSTATIN)      potassium chloride SA (K-DUR,KLOR-CON) 20 MEQ tablet Take 20 mEq by mouth daily.   0   risperiDONE (RISPERDAL) 0.5 MG tablet      rosuvastatin (CRESTOR) 20 MG tablet   0   sertraline (ZOLOFT) 100 MG tablet      sertraline (ZOLOFT) 100 MG tablet Take 1 tablet by mouth daily.     sertraline (ZOLOFT) 50 MG tablet      silver nitrate applicators 32-35 % applicator Apply to wound every other day then cover with guaze and papertape 100 each 0   sulfamethoxazole-trimethoprim (BACTRIM) 400-80 MG tablet Take 1 tablet by mouth 2 (two) times daily. 28 tablet 0   No current facility-administered medications on file prior to visit.    Allergies  Allergen Reactions   Levofloxacin Other (See Comments)    confusion   Oxycodone     Objective: There were no vitals filed for this visit.  General: No acute distress, AAOx3  Right posterior heel/leg, reopening of Achilles wound that measures 2 x 1-1/2 cm with a granular base and slightly rolled borders with marginal PERI wound erythema that is blanchable in nature no significant warmth no active drainage no malodor.  Right sub met 1 preulcerative callus with no opening or signs of infection  Preulcerative callus noted submet 5 and 1on the left with no opening or signs of infection  To the left central heel there is a hemorrhagic callus upon debridement there is an ulceration that measures 3 x 3 cm partial-thickness with a granular bleeding base.  The wound does not tunnel or probe.  There is no surrounding erythema edema or warmth or malodor or any other acute signs of infection.  Abrasion noted to multiple toes bilateral with no underlying opening or signs of infection.  Nails x 10 are mildly elongated and thickened consistent with onychomycosis.  Capillary fill time <3 seconds in all digits, gross sensation present via light touch bilateral.  No pain to palpation bilateral.  No pain with  calf compression.  Assessment and Plan:  Problem List Items Addressed This Visit       Endocrine   Diabetic polyneuropathy associated with type 2 diabetes mellitus ([place])     Nervous and Auditory   Dementia ([place])   Other Visit Diagnoses     Leg ulcer, right, limited to breakdown of skin ([place])    -  Primary   Heel ulcer, left, limited to breakdown of  skin ([place])       Pre-ulcerative calluses           -Patient seen and evaluated -Mechanically debrided all preulcerative lesions/calluses using a sterile 15 blade with no underlying opening noted.   -Right leg/Achilles ulcer: Excisionally dedbrided ulceration to healthy bleeding borders removing nonviable tissue using a sterile chisel blade. Wound measures post debridement as listed above. Wound was debrided to the level of the dermis with viable wound base exposed to promote healing. Hemostasis was achieved with manuel pressure. Patient tolerated procedure well without any discomfort or anesthesia necessary for this wound debridement. Applied Prisma and dry sterile dressing and instructed patient to continue with daily dressings at home consisting of the same every other day -Left heel ulcer :Excisionally dedbrided ulceration to healthy bleeding borders removing nonviable tissue using a sterile chisel blade. Wound measures post debridement as listed above. Wound was debrided to the level of the dermis with viable wound base exposed to promote healing. Hemostasis was achieved with manuel pressure. Patient tolerated procedure well without any discomfort or anesthesia necessary for this wound debridement. Applied Prisma and dry sterile dressing and instructed patient to continue with daily dressings at home consisting of the same every other day - Advised patient to go to the ER or return to office if the wound worsens or if constitutional symptoms are present. -Added offloading padding to patient shoes and encouraged daughter to continue to  encourage mom to wear shoes even when in the house to help offload ulcers and preulcerative areas -At no additional charge mechanically debrided nails x10 using a sterile nail nipper without incident -Return to office in 2 weeks for continued wound care or sooner if problems arise.  Patient needs to return until her wounds have healed I have anticipated patient will need multiple bimonthly visits to ensure that the wounds are adequately debrided and offloaded due to patient's chronic history of ulceration and chronic history of poor healing secondary to diabetes.  Landis Martins, DPM

## 2020-12-24 ENCOUNTER — Telehealth: Payer: Self-pay

## 2020-12-24 NOTE — Telephone Encounter (Signed)
Cindy from [facility] LVM stating they need to know what type of prisma they should be applying to the pt. Please advice

## 2020-12-25 NOTE — Telephone Encounter (Signed)
Called Cindy Hahn back and instructed her they can used [employer] or promogran per Dr. Cannon Kettle

## 2021-01-01 ENCOUNTER — Encounter: Payer: Self-pay | Admitting: Sports Medicine

## 2021-01-01 ENCOUNTER — Other Ambulatory Visit: Payer: Self-pay

## 2021-01-01 ENCOUNTER — Ambulatory Visit (INDEPENDENT_AMBULATORY_CARE_PROVIDER_SITE_OTHER): Payer: No Typology Code available for payment source | Admitting: Sports Medicine

## 2021-01-01 DIAGNOSIS — L97911 Non-pressure chronic ulcer of unspecified part of right lower leg limited to breakdown of skin: Secondary | ICD-10-CM

## 2021-01-01 DIAGNOSIS — E1142 Type 2 diabetes mellitus with diabetic polyneuropathy: Secondary | ICD-10-CM

## 2021-01-01 DIAGNOSIS — L97501 Non-pressure chronic ulcer of other part of unspecified foot limited to breakdown of skin: Secondary | ICD-10-CM

## 2021-01-01 DIAGNOSIS — F0150 Vascular dementia without behavioral disturbance: Secondary | ICD-10-CM

## 2021-01-01 DIAGNOSIS — L84 Corns and callosities: Secondary | ICD-10-CM

## 2021-01-01 MED ORDER — SULFAMETHOXAZOLE-TRIMETHOPRIM 400-80 MG PO TABS: 1.0000 | ORAL_TABLET | Freq: Two times a day (BID) | ORAL | 0 refills | Status: DC

## 2021-01-01 MED ORDER — FLUCONAZOLE 150 MG PO TABS: 150.0000 mg | ORAL_TABLET | Freq: Once | ORAL | 2 refills | Status: AC

## 2021-01-01 NOTE — Progress Notes (Signed)
Subjective: Cindy Hahn is a 86 y.o. female patient seen today in office for follow-up wound care.  Patient reports that she is feeling tired, tripping and falling and just does not feel her best.  Patient admits also increased pain to the left plantar foot at the fifth toe joint.  Patient is assisted by daughter this visit who also helps with wound care.  Patient Active Problem List   Diagnosis Date Noted   Breast cancer in female [facility]) 08/27/2020   Lewy body dementia ([place]) 04/24/2020   Hyperlipidemia 02/01/2020   Subdural hematoma ([place]) 02/01/2020   Osteomyelitis ([place]) 10/17/2018   Diabetic foot ulcer ([place]) 05/05/2018   History of CVA (cerebrovascular accident) 05/05/2018   Dementia ([place]) 05/05/2018   Diabetic polyneuropathy associated with type 2 diabetes mellitus ([place]) 06/04/2015   Pacemaker 03/11/2015   Bradycardia 02/06/2015   Chronic atrial fibrillation ([place]) 11/01/2014   Essential hypertension 11/01/2014   Diabetes mellitus ([place]) 11/01/2014    Current Outpatient Medications on File Prior to Visit  Medication Sig Dispense Refill   acetaminophen (TYLENOL) 325 MG tablet Take by mouth.     ALPRAZolam (XANAX) 0.25 MG tablet      ALPRAZolam (XANAX) 1 MG tablet Take 1 mg by mouth.     ALPRAZolam (XANAX) 1 MG tablet Take by mouth.     ampicillin (PRINCIPEN) 500 MG capsule      anastrozole (ARIMIDEX) 1 MG tablet Take 1 tablet by mouth daily.     aspirin 81 MG EC tablet Take by mouth.     aspirin EC 81 MG tablet Take 81 mg by mouth.     BD INSULIN SYRINGE U/F 31G X 5/16" 1 ML MISC      cefTRIAXone (ROCEPHIN) 1 g injection      cefUROXime (CEFTIN) 250 MG tablet      Cholecalciferol 125 MCG (5000 UT) TABS Take by mouth.     collagenase (SANTYL) ointment Apply 1 application topically daily. 30 g 0   Cranberry-Vitamin C-Inulin (UTI-STAT) LIQD Take by mouth.     digoxin (LANOXIN) 0.125 MG tablet Take 0.0625 mg by mouth daily.   0   digoxin (LANOXIN) 0.125 MG tablet Take by mouth.      diltiazem (CARDIZEM) 30 MG tablet      diltiazem (CARDIZEM) 90 MG tablet Take 1 tablet (90 mg total) by mouth 2 (two) times daily. Please make overdue appt with Dr. Curt Bears before anymore refills. 1st attempt 60 tablet 0   donepezil (ARICEPT) 5 MG tablet Take by mouth.     doxycycline (VIBRA-TABS) 100 MG tablet Take 1 tablet (100 mg total) by mouth 2 (two) times daily. 20 tablet 0   doxycycline (VIBRAMYCIN) 100 MG capsule      gabapentin (NEURONTIN) 100 MG capsule Take 1 capsule (100 mg total) by mouth at bedtime. 30 capsule 3   insulin glargine (LANTUS) 100 UNIT/ML injection Inject into the skin.     insulin regular (NOVOLIN R) 100 units/mL injection Inject into the skin.     LEVEMIR 100 UNIT/ML injection Inject 74 Units into the skin.      lidocaine (XYLOCAINE) 1 % (with preservative) injection      memantine (NAMENDA) 5 MG tablet Take by mouth.     metoprolol tartrate (LOPRESSOR) 100 MG tablet Take 1 tablet (100 mg total) by mouth 2 (two) times daily. 180 tablet 3   Multiple Vitamin (QUINTABS) TABS Take 1 tablet by mouth daily.     naproxen sodium (ALEVE) 220 MG  tablet Take 220 mg by mouth daily.     nitrofurantoin, macrocrystal-monohydrate, (MACROBID) 100 MG capsule      NOVOLOG FLEXPEN 100 UNIT/ML FlexPen inject 10 units SUBCUTANEOUSLY WITH MAIN MEAL  0   NYAMYC powder      nystatin cream (MYCOSTATIN)      potassium chloride SA (K-DUR,KLOR-CON) 20 MEQ tablet Take 20 mEq by mouth daily.   0   risperiDONE (RISPERDAL) 0.5 MG tablet      rosuvastatin (CRESTOR) 20 MG tablet   0   sertraline (ZOLOFT) 100 MG tablet      sertraline (ZOLOFT) 100 MG tablet Take 1 tablet by mouth daily.     sertraline (ZOLOFT) 50 MG tablet      silver nitrate applicators 12-75 % applicator Apply to wound every other day then cover with guaze and papertape 100 each 0   No current facility-administered medications on file prior to visit.    Allergies  Allergen Reactions   Levofloxacin Other (See Comments)     confusion   Oxycodone     Objective: There were no vitals filed for this visit.  General: No acute distress, AAOx3  Right posterior heel/leg open wound of Achilles 1.5 x 1 cm  with a granular base and slightly rolled borders with marginal  periwound erythema that is blanchable in nature no significant warmth no active drainage no malodor.  Right sub met 1 preulcerative callus with no opening or signs of infection  Preulcerative callus noted submet 5 and 1on the left upon paring of the callus submet on the left and there was some serosanguineous drainage and an ulcerated wound base that measures less than 0.5 cm.  There was no significant malodor very minimal edema and erythema present.  To the left central heel there is mild reactive keratosis previous ulcer healed.  Abrasion noted to multiple toes bilateral with no underlying opening or signs of infection.  Nails x 10 are mildly short and thickened consistent with onychomycosis.  Capillary fill time <3 seconds in all digits, gross sensation present via light touch bilateral.  No pain to palpation bilateral.  No pain with calf compression.  Assessment and Plan:  Problem List Items Addressed This Visit       Endocrine   Diabetic polyneuropathy associated with type 2 diabetes mellitus ([place])     Nervous and Auditory   Dementia ([place])   Other Visit Diagnoses     Ulcer of foot, limited to breakdown of skin, unspecified laterality ([place])    -  Primary   Relevant Orders   WOUND CULTURE   Leg ulcer, right, limited to breakdown of skin ([place])       Relevant Orders   WOUND CULTURE   Pre-ulcerative calluses           -Patient seen and evaluated -Mechanically debrided all preulcerative lesions/calluses using a sterile 15 blade with no underlying opening noted.   -Right leg/Achilles ulcer: Excisionally dedbrided ulceration to healthy bleeding borders removing nonviable tissue using a sterile chisel blade. Wound measures post  debridement as listed above. Wound was debrided to the level of the dermis with viable wound base exposed to promote healing. Hemostasis was achieved with manuel pressure. Patient tolerated procedure well without any discomfort or anesthesia necessary for this wound debridement.  Wound culture obtained for surveillance.  Applied Prisma and dry sterile dressing and instructed patient to continue with daily dressings at home consisting of the same every other day -Left  submet 5 :Excisionally dedbrided ulceration  to healthy bleeding borders removing nonviable tissue using a sterile chisel blade. Wound measures post debridement as listed above. Wound was debrided to the level of the dermis with viable wound base exposed to promote healing. Hemostasis was achieved with manuel pressure. Patient tolerated procedure well without any discomfort or anesthesia necessary for this wound debridement.  Wound culture obtained since there was seropurulent drainage that was expressed.  Applied Iodosorb and dry sterile dressing and instructed patient to continue with daily dressings at home consisting of the same every day -Started patient on Bactrim antibiotic with Diflucan - Advised patient to go to the ER or return to office if the wound worsens or if constitutional symptoms are present. -Return to office in 2 weeks for continued wound care or sooner if problems arise.  Landis Martins, DPM

## 2021-01-03 ENCOUNTER — Telehealth: Payer: Self-pay | Admitting: *Deleted

## 2021-01-03 NOTE — Telephone Encounter (Signed)
Called and spoke with Jenny Reichmann at [facility] and relayed the message per Dr Cannon Kettle. Lattie Haw

## 2021-01-03 NOTE — Telephone Encounter (Signed)
-----  Message from Landis Martins, [place] sent at 01/02/2021 12:30 PM EDT ----- I'm not using Provena. They must be talking about Prisma. If so then they can use calcium alginate instead to her right achilles and iodosorb to the left sub met 1 area  ----- Message ----- From: Viviana Simpler, PMAC Sent: 01/02/2021  12:03 PM EDT To: Landis Martins, DPM  [facility] called today and stated that the provena? That was used cost a little too much and wanted to see if there was anything else that patient could try and to call them back at [phone]. Lattie Haw

## 2021-01-15 ENCOUNTER — Ambulatory Visit (INDEPENDENT_AMBULATORY_CARE_PROVIDER_SITE_OTHER): Payer: No Typology Code available for payment source | Admitting: Sports Medicine

## 2021-01-15 ENCOUNTER — Other Ambulatory Visit: Payer: Self-pay

## 2021-01-15 ENCOUNTER — Encounter: Payer: Self-pay | Admitting: Sports Medicine

## 2021-01-15 DIAGNOSIS — E1142 Type 2 diabetes mellitus with diabetic polyneuropathy: Secondary | ICD-10-CM | POA: Diagnosis not present

## 2021-01-15 DIAGNOSIS — L84 Corns and callosities: Secondary | ICD-10-CM

## 2021-01-15 DIAGNOSIS — L97501 Non-pressure chronic ulcer of other part of unspecified foot limited to breakdown of skin: Secondary | ICD-10-CM | POA: Diagnosis not present

## 2021-01-15 DIAGNOSIS — F0150 Vascular dementia without behavioral disturbance: Secondary | ICD-10-CM

## 2021-01-15 DIAGNOSIS — L97911 Non-pressure chronic ulcer of unspecified part of right lower leg limited to breakdown of skin: Secondary | ICD-10-CM | POA: Diagnosis not present

## 2021-01-15 LAB — WOUND CULTURE

## 2021-01-15 NOTE — Progress Notes (Signed)
Subjective: Cindy Hahn is a 86 y.o. female patient seen today in office for follow-up wound care.  Patient is having recurrent falls and states that the right Achilles is still draining and has been covering it with a Band-Aid with assistance from daughter who has been also applying Prisma.  [facility].  Patient Active Problem List   Diagnosis Date Noted   Breast cancer in female [facility]) 08/27/2020   Lewy body dementia ([place]) 04/24/2020   Hyperlipidemia 02/01/2020   Subdural hematoma ([place]) 02/01/2020   Osteomyelitis ([place]) 10/17/2018   Diabetic foot ulcer ([place]) 05/05/2018   History of CVA (cerebrovascular accident) 05/05/2018   Dementia ([place]) 05/05/2018   Diabetic polyneuropathy associated with type 2 diabetes mellitus ([place]) 06/04/2015   Pacemaker 03/11/2015   Bradycardia 02/06/2015   Chronic atrial fibrillation ([place]) 11/01/2014   Essential hypertension 11/01/2014   Diabetes mellitus ([place]) 11/01/2014    Current Outpatient Medications on File Prior to Visit  Medication Sig Dispense Refill   acetaminophen (TYLENOL) 325 MG tablet Take by mouth.     ALPRAZolam (XANAX) 0.25 MG tablet      ALPRAZolam (XANAX) 1 MG tablet Take 1 mg by mouth.     ALPRAZolam (XANAX) 1 MG tablet Take by mouth.     ampicillin (PRINCIPEN) 500 MG capsule      anastrozole (ARIMIDEX) 1 MG tablet Take 1 tablet by mouth daily.     aspirin 81 MG EC tablet Take by mouth.     aspirin EC 81 MG tablet Take 81 mg by mouth.     BD INSULIN SYRINGE U/F 31G X 5/16" 1 ML MISC      cefTRIAXone (ROCEPHIN) 1 g injection      cefUROXime (CEFTIN) 250 MG tablet      Cholecalciferol 125 MCG (5000 UT) TABS Take by mouth.     collagenase (SANTYL) ointment Apply 1 application topically daily. 30 g 0   Cranberry-Vitamin C-Inulin (UTI-STAT) LIQD Take by mouth.     digoxin (LANOXIN) 0.125 MG tablet Take 0.0625 mg by mouth daily.   0   digoxin (LANOXIN) 0.125 MG tablet Take by mouth.     diltiazem  (CARDIZEM) 30 MG tablet      diltiazem (CARDIZEM) 90 MG tablet Take 1 tablet (90 mg total) by mouth 2 (two) times daily. Please make overdue appt with Dr. Curt Bears before anymore refills. 1st attempt 60 tablet 0   donepezil (ARICEPT) 5 MG tablet Take by mouth.     doxycycline (VIBRA-TABS) 100 MG tablet Take 1 tablet (100 mg total) by mouth 2 (two) times daily. 20 tablet 0   doxycycline (VIBRAMYCIN) 100 MG capsule      ertapenem (INVANZ) 1 g injection      fosfomycin (MONUROL) 3 g PACK SMARTSIG:1 Packet(s) By Mouth     gabapentin (NEURONTIN) 100 MG capsule Take 1 capsule (100 mg total) by mouth at bedtime. 30 capsule 3   insulin glargine (LANTUS) 100 UNIT/ML injection Inject into the skin.     insulin regular (NOVOLIN R) 100 units/mL injection Inject into the skin.     LEVEMIR 100 UNIT/ML injection Inject 74 Units into the skin.      lidocaine (XYLOCAINE) 1 % (with preservative) injection      memantine (NAMENDA) 5 MG tablet Take by mouth.     metoprolol tartrate (LOPRESSOR) 100 MG tablet Take 1 tablet (100 mg total) by mouth 2 (two) times daily. 180 tablet 3   Multiple  Vitamin (QUINTABS) TABS Take 1 tablet by mouth daily.     naproxen sodium (ALEVE) 220 MG tablet Take 220 mg by mouth daily.     nitrofurantoin, macrocrystal-monohydrate, (MACROBID) 100 MG capsule      NOVOLOG FLEXPEN 100 UNIT/ML FlexPen inject 10 units SUBCUTANEOUSLY WITH MAIN MEAL  0   NYAMYC powder      nystatin cream (MYCOSTATIN)      potassium chloride SA (K-DUR,KLOR-CON) 20 MEQ tablet Take 20 mEq by mouth daily.   0   risperiDONE (RISPERDAL) 0.5 MG tablet      rosuvastatin (CRESTOR) 20 MG tablet   0   sertraline (ZOLOFT) 100 MG tablet      sertraline (ZOLOFT) 100 MG tablet Take 1 tablet by mouth daily.     sertraline (ZOLOFT) 50 MG tablet      silver nitrate applicators 68-03 % applicator Apply to wound every other day then cover with guaze and papertape 100 each 0   sulfamethoxazole-trimethoprim (BACTRIM) 400-80 MG  tablet Take 1 tablet by mouth 2 (two) times daily. 28 tablet 0   No current facility-administered medications on file prior to visit.    Allergies  Allergen Reactions   Levofloxacin Other (See Comments)    confusion   Oxycodone     Objective: There were no vitals filed for this visit.  General: No acute distress, AAOx3  Right posterior heel/leg open wound of Achilles 1.5 x 1cm  with a granular base and increased periwound swelling with slightly rolled borders with marginal  periwound erythema that is blanchable in nature no significant warmth no active drainage no malodor.  Right sub met 1 preulcerative callus with no opening or signs of infection  Preulcerative callus noted submet 5 and 1on the left upon paring no underlying opening.  To the left central heel there is mild reactive keratosis previous ulcer healed.  Abrasion noted to multiple toes bilateral with no underlying opening or signs of infection.  Nails x 10 are mildly short and thickened consistent with onychomycosis.  Capillary fill time <3 seconds in all digits, gross sensation present via light touch bilateral.  No pain to palpation bilateral.  No pain with calf compression.  Assessment and Plan:  Problem List Items Addressed This Visit       Endocrine   Diabetic polyneuropathy associated with type 2 diabetes mellitus ([place])     Nervous and Auditory   Dementia ([place])   Other Visit Diagnoses     Leg ulcer, right, limited to breakdown of skin ([place])    -  Primary   Ulcer of foot, limited to breakdown of skin, unspecified laterality ([place])       Pre-ulcerative calluses           -Patient seen and evaluated -Mechanically debrided all preulcerative lesions/calluses using a sterile 15 blade with no underlying opening noted.   -Right leg/Achilles ulcer: None excisionally with a saline moist gauze dedbrided ulceration to healthy bleeding borders. Wound measures post debridement as listed above. Wound was debrided  to the level of the dermis with viable wound base exposed to promote healing. Hemostasis was achieved with manuel pressure. Patient tolerated procedure well without any discomfort or anesthesia necessary for this wound debridement.  Wound culture obtained for surveillance.  Applied Medihoney and dry sterile dressing and instructed patient to continue with daily dressings at home consisting of the same every other day alternating between Prisma and Medihoney -Applied Surgigrip compression sleeve to wear during the day to help with swelling  at the posterior heel -Continue with Bactrim until completed; culture results reviewed with daughter consistent with Klebsiella however sensitive to Bactrim - Advised patient to go to the ER or return to office if the wound worsens or if constitutional symptoms are present. -Return to office in 2 weeks for continued wound care or sooner if problems arise.  Landis Martins, DPM

## 2021-01-28 ENCOUNTER — Encounter: Payer: 59 | Admitting: Student

## 2021-01-29 ENCOUNTER — Ambulatory Visit (INDEPENDENT_AMBULATORY_CARE_PROVIDER_SITE_OTHER): Payer: No Typology Code available for payment source | Admitting: Sports Medicine

## 2021-01-29 ENCOUNTER — Encounter: Payer: Self-pay | Admitting: Sports Medicine

## 2021-01-29 DIAGNOSIS — F0150 Vascular dementia without behavioral disturbance: Secondary | ICD-10-CM

## 2021-01-29 DIAGNOSIS — L84 Corns and callosities: Secondary | ICD-10-CM

## 2021-01-29 DIAGNOSIS — L97911 Non-pressure chronic ulcer of unspecified part of right lower leg limited to breakdown of skin: Secondary | ICD-10-CM | POA: Diagnosis not present

## 2021-01-29 DIAGNOSIS — E1142 Type 2 diabetes mellitus with diabetic polyneuropathy: Secondary | ICD-10-CM

## 2021-01-29 NOTE — Progress Notes (Signed)
Subjective: Cindy Hahn is a 86 y.o. female patient seen today in office for follow-up wound care.  Patient is having recurrent falls and states that the right Achilles is still draining and has been covering it with a Band-Aid with assistance from daughter who has been also applying Prisma.  Patient is also at stay well during the day.  Patient Active Problem List   Diagnosis Date Noted   Breast cancer in female [facility]) 08/27/2020   Lewy body dementia ([place]) 04/24/2020   Hyperlipidemia 02/01/2020   Subdural hematoma 02/01/2020   Osteomyelitis ([place]) 10/17/2018   Diabetic foot ulcer ([place]) 05/05/2018   History of CVA (cerebrovascular accident) 05/05/2018   Dementia ([place]) 05/05/2018   Diabetic polyneuropathy associated with type 2 diabetes mellitus ([place]) 06/04/2015   Pacemaker 03/11/2015   Bradycardia 02/06/2015   Chronic atrial fibrillation ([place]) 11/01/2014   Essential hypertension 11/01/2014   Diabetes mellitus ([place]) 11/01/2014    Current Outpatient Medications on File Prior to Visit  Medication Sig Dispense Refill   acetaminophen (TYLENOL) 325 MG tablet Take by mouth.     ALPRAZolam (XANAX) 0.25 MG tablet      ALPRAZolam (XANAX) 1 MG tablet Take 1 mg by mouth.     ALPRAZolam (XANAX) 1 MG tablet Take by mouth.     ampicillin (PRINCIPEN) 500 MG capsule      anastrozole (ARIMIDEX) 1 MG tablet Take 1 tablet by mouth daily.     aspirin 81 MG EC tablet Take by mouth.     aspirin EC 81 MG tablet Take 81 mg by mouth.     BD INSULIN SYRINGE U/F 31G X 5/16" 1 ML MISC      cefTRIAXone (ROCEPHIN) 1 g injection      cefUROXime (CEFTIN) 250 MG tablet      Cholecalciferol 125 MCG (5000 UT) TABS Take by mouth.     collagenase (SANTYL) ointment Apply 1 application topically daily. 30 g 0   Cranberry-Vitamin C-Inulin (UTI-STAT) LIQD Take by mouth.     digoxin (LANOXIN) 0.125 MG tablet Take 0.0625 mg by mouth daily.   0   digoxin (LANOXIN) 0.125 MG tablet Take by mouth.     diltiazem  (CARDIZEM) 30 MG tablet      diltiazem (CARDIZEM) 90 MG tablet Take 1 tablet (90 mg total) by mouth 2 (two) times daily. Please make overdue appt with Dr. Curt Bears before anymore refills. 1st attempt 60 tablet 0   donepezil (ARICEPT) 5 MG tablet Take by mouth.     doxycycline (VIBRA-TABS) 100 MG tablet Take 1 tablet (100 mg total) by mouth 2 (two) times daily. 20 tablet 0   doxycycline (VIBRAMYCIN) 100 MG capsule      ertapenem (INVANZ) 1 g injection      fosfomycin (MONUROL) 3 g PACK SMARTSIG:1 Packet(s) By Mouth     gabapentin (NEURONTIN) 100 MG capsule Take 1 capsule (100 mg total) by mouth at bedtime. 30 capsule 3   insulin glargine (LANTUS) 100 UNIT/ML injection Inject into the skin.     insulin regular (NOVOLIN R) 100 units/mL injection Inject into the skin.     LEVEMIR 100 UNIT/ML injection Inject 74 Units into the skin.      lidocaine (XYLOCAINE) 1 % (with preservative) injection      memantine (NAMENDA) 5 MG tablet Take by mouth.     metoprolol tartrate (LOPRESSOR) 100 MG tablet Take 1 tablet (100 mg total) by mouth 2 (two) times daily. 180 tablet 3   Multiple Vitamin (  QUINTABS) TABS Take 1 tablet by mouth daily.     naproxen sodium (ALEVE) 220 MG tablet Take 220 mg by mouth daily.     nitrofurantoin, macrocrystal-monohydrate, (MACROBID) 100 MG capsule      NOVOLOG FLEXPEN 100 UNIT/ML FlexPen inject 10 units SUBCUTANEOUSLY WITH MAIN MEAL  0   NYAMYC powder      nystatin cream (MYCOSTATIN)      potassium chloride SA (K-DUR,KLOR-CON) 20 MEQ tablet Take 20 mEq by mouth daily.   0   risperiDONE (RISPERDAL) 0.5 MG tablet      rosuvastatin (CRESTOR) 20 MG tablet   0   sertraline (ZOLOFT) 100 MG tablet      sertraline (ZOLOFT) 100 MG tablet Take 1 tablet by mouth daily.     sertraline (ZOLOFT) 50 MG tablet      silver nitrate applicators 08-67 % applicator Apply to wound every other day then cover with guaze and papertape 100 each 0   sulfamethoxazole-trimethoprim (BACTRIM) 400-80 MG  tablet Take 1 tablet by mouth 2 (two) times daily. 28 tablet 0   No current facility-administered medications on file prior to visit.    Allergies  Allergen Reactions   Levofloxacin Other (See Comments)    confusion   Oxycodone     Objective: There were no vitals filed for this visit.  General: No acute distress, AAOx3  Right posterior heel/leg open wound of Achilles 1.5 x 1cm same as previous with a granular base and decreased periwound swelling with slightly rolled borders with marginal  periwound erythema and maceration, no warmth no active drainage no malodor.  Right sub met 1 and 3 pre-ulcerative callus with no opening or signs of infection  Preulcerative callus noted submet 5 and 1on the left upon paring no underlying opening.  To the left central heel there is mild reactive keratosis previous ulcer healed.  Abrasion noted to multiple toes bilateral with no underlying opening or signs of infection.  Nails x 10 are mildly elongated and thickened consistent with onychomycosis.  Capillary fill time <3 seconds in all digits, gross sensation present via light touch bilateral.  No pain to palpation bilateral.  No pain with calf compression.  Assessment and Plan:  Problem List Items Addressed This Visit       Endocrine   Diabetic polyneuropathy associated with type 2 diabetes mellitus ([place])   Other Visit Diagnoses     Leg ulcer, right, limited to breakdown of skin ([place])    -  Primary   Pre-ulcerative calluses       Vascular dementia without behavioral disturbance ([place])           -Patient seen and evaluated -Mechanically debrided all preulcerative lesions/calluses using a sterile 15 blade with no underlying opening noted to any of the other both lesions. -Right leg/Achilles ulcer: Using a sterile tissue nipper debridement was performed to healthy bleeding hemostasis was achieved with manual pressure all night nonviable wound tissue was excised sharply using tissue nipper  from the wound bed and then cleansed with a saline moist gauze dedbrided ulceration to healthy bleeding borders. Wound measures post debridement as listed above. Wound was debrided to the level of the dermis with viable wound base exposed to promote healing. Hemostasis was achieved with manuel pressure. Patient tolerated procedure well without any discomfort or anesthesia necessary for this wound debridement.  Wound culture obtained for surveillance.  Applied Prisma to the central wound and Betadine periwound and dry sterile dressing and instructed patient to continue with daily  dressings at home consisting of the same every other day alternating between Prisma and Medihoney like previous -Applied Surgigrip compression sleeve to wear during the day to help with swelling at the posterior heel and any pain -At no additional charge mechanically debrided nails using a sterile nail nipper and treated any small nicks with silver nitrate - Advised patient to go to the ER or return to office if the wound worsens or if constitutional symptoms are present. -Return to office in 2 weeks for continued wound care or sooner if problems arise.  Landis Martins, DPM

## 2021-02-12 ENCOUNTER — Other Ambulatory Visit: Payer: Self-pay

## 2021-02-12 ENCOUNTER — Encounter: Payer: Self-pay | Admitting: Sports Medicine

## 2021-02-12 ENCOUNTER — Ambulatory Visit (INDEPENDENT_AMBULATORY_CARE_PROVIDER_SITE_OTHER): Payer: No Typology Code available for payment source | Admitting: Sports Medicine

## 2021-02-12 DIAGNOSIS — L84 Corns and callosities: Secondary | ICD-10-CM

## 2021-02-12 DIAGNOSIS — F0150 Vascular dementia without behavioral disturbance: Secondary | ICD-10-CM

## 2021-02-12 DIAGNOSIS — E1142 Type 2 diabetes mellitus with diabetic polyneuropathy: Secondary | ICD-10-CM

## 2021-02-12 DIAGNOSIS — L97501 Non-pressure chronic ulcer of other part of unspecified foot limited to breakdown of skin: Secondary | ICD-10-CM

## 2021-02-12 DIAGNOSIS — L03032 Cellulitis of left toe: Secondary | ICD-10-CM

## 2021-02-12 DIAGNOSIS — L97911 Non-pressure chronic ulcer of unspecified part of right lower leg limited to breakdown of skin: Secondary | ICD-10-CM

## 2021-02-12 MED ORDER — FLUCONAZOLE 150 MG PO TABS: 150.0000 mg | ORAL_TABLET | Freq: Once | ORAL | 0 refills | Status: DC

## 2021-02-12 MED ORDER — SULFAMETHOXAZOLE-TRIMETHOPRIM 400-80 MG PO TABS: 1.0000 | ORAL_TABLET | Freq: Two times a day (BID) | ORAL | 0 refills | Status: DC

## 2021-02-12 MED ORDER — FLUCONAZOLE 150 MG PO TABS: 150.0000 mg | ORAL_TABLET | Freq: Once | ORAL | 0 refills | Status: AC

## 2021-02-12 NOTE — Progress Notes (Signed)
Subjective: Cindy Hahn is a 86 y.o. female patient seen today in office for follow-up wound care.  Patient had eye surgery on yesterday and is noted to be sneezing off and on during the encounter.  Patient is assisted by daughter who helps to report history who states that she thinks that things are so far going well and has not noticed any acute concerns has been consistent with doing dressing on the right leg/Achilles and thinks that the Medihoney and Trixie Deis is helping  Patient is also at stay well during the day but has not been since her eye surgery.  Patient Active Problem List   Diagnosis Date Noted   Breast cancer in female [facility]) 08/27/2020   Lewy body dementia ([place]) 04/24/2020   Hyperlipidemia 02/01/2020   Subdural hematoma 02/01/2020   Osteomyelitis ([place]) 10/17/2018   Diabetic foot ulcer ([place]) 05/05/2018   History of CVA (cerebrovascular accident) 05/05/2018   Dementia ([place]) 05/05/2018   Diabetic polyneuropathy associated with type 2 diabetes mellitus ([place]) 06/04/2015   Pacemaker 03/11/2015   Bradycardia 02/06/2015   Chronic atrial fibrillation ([place]) 11/01/2014   Essential hypertension 11/01/2014   Diabetes mellitus ([place]) 11/01/2014    Current Outpatient Medications on File Prior to Visit  Medication Sig Dispense Refill   acetaminophen (TYLENOL) 325 MG tablet Take by mouth.     ALPRAZolam (XANAX) 0.25 MG tablet      ALPRAZolam (XANAX) 1 MG tablet Take 1 mg by mouth.     ALPRAZolam (XANAX) 1 MG tablet Take by mouth.     ampicillin (PRINCIPEN) 500 MG capsule      anastrozole (ARIMIDEX) 1 MG tablet Take 1 tablet by mouth daily.     aspirin 81 MG EC tablet Take by mouth.     aspirin EC 81 MG tablet Take 81 mg by mouth.     BD INSULIN SYRINGE U/F 31G X 5/16" 1 ML MISC      cefTRIAXone (ROCEPHIN) 1 g injection      cefUROXime (CEFTIN) 250 MG tablet      Cholecalciferol 125 MCG (5000 UT) TABS Take by mouth.     collagenase (SANTYL) ointment Apply 1 application topically  daily. 30 g 0   Cranberry-Vitamin C-Inulin (UTI-STAT) LIQD Take by mouth.     digoxin (LANOXIN) 0.125 MG tablet Take 0.0625 mg by mouth daily.   0   digoxin (LANOXIN) 0.125 MG tablet Take by mouth.     diltiazem (CARDIZEM) 30 MG tablet      diltiazem (CARDIZEM) 90 MG tablet Take 1 tablet (90 mg total) by mouth 2 (two) times daily. Please make overdue appt with Dr. Curt Bears before anymore refills. 1st attempt 60 tablet 0   donepezil (ARICEPT) 5 MG tablet Take by mouth.     doxycycline (VIBRA-TABS) 100 MG tablet Take 1 tablet (100 mg total) by mouth 2 (two) times daily. 20 tablet 0   doxycycline (VIBRAMYCIN) 100 MG capsule      ertapenem (INVANZ) 1 g injection      fosfomycin (MONUROL) 3 g PACK SMARTSIG:1 Packet(s) By Mouth     gabapentin (NEURONTIN) 100 MG capsule Take 1 capsule (100 mg total) by mouth at bedtime. 30 capsule 3   insulin glargine (LANTUS) 100 UNIT/ML injection Inject into the skin.     insulin regular (NOVOLIN R) 100 units/mL injection Inject into the skin.     LEVEMIR 100 UNIT/ML injection Inject 74 Units into the skin.      lidocaine (XYLOCAINE) 1 % (with preservative)  injection      memantine (NAMENDA) 5 MG tablet Take by mouth.     metoprolol tartrate (LOPRESSOR) 100 MG tablet Take 1 tablet (100 mg total) by mouth 2 (two) times daily. 180 tablet 3   Multiple Vitamin (QUINTABS) TABS Take 1 tablet by mouth daily.     naproxen sodium (ALEVE) 220 MG tablet Take 220 mg by mouth daily.     nitrofurantoin, macrocrystal-monohydrate, (MACROBID) 100 MG capsule      NOVOLOG FLEXPEN 100 UNIT/ML FlexPen inject 10 units SUBCUTANEOUSLY WITH MAIN MEAL  0   NYAMYC powder      nystatin cream (MYCOSTATIN)      potassium chloride SA (K-DUR,KLOR-CON) 20 MEQ tablet Take 20 mEq by mouth daily.   0   risperiDONE (RISPERDAL) 0.5 MG tablet      rosuvastatin (CRESTOR) 20 MG tablet   0   sertraline (ZOLOFT) 100 MG tablet      sertraline (ZOLOFT) 100 MG tablet Take 1 tablet by mouth daily.      sertraline (ZOLOFT) 50 MG tablet      silver nitrate applicators 56-81 % applicator Apply to wound every other day then cover with guaze and papertape 100 each 0   No current facility-administered medications on file prior to visit.    Allergies  Allergen Reactions   Levofloxacin Other (See Comments)    confusion   Oxycodone     Objective: There were no vitals filed for this visit.  General: No acute distress, AAOx3  Right posterior heel/leg open wound of Achilles 1.3 x 1cm smaller than previous with a granular base and decreased periwound swelling with slightly rolled borders with marginal  periwound erythema and maceration, no warmth no active drainage no malodor.  Right sub met 1 and 3 pre-ulcerative callus with no opening or signs of infection  Preulcerative callus noted submet 5 and 1on the left upon paring on the preulcerative callus sub met 5 on the left there was significant seropurulent drainage with an underlying ulcerated wound base that measures less than 1 cm partial-thickness in nature with localized erythema and localized edema.  To the left central heel there is mild reactive keratosis previous ulcer healed.  Abrasion noted to multiple toes bilateral with no underlying opening or signs of infection.  Nails x 10 are mildly short and thickened consistent with onychomycosis.  Capillary fill time <3 seconds in all digits, gross sensation present via light touch bilateral.  No pain to palpation bilateral.  No pain with calf compression.  Assessment and Plan:  Problem List Items Addressed This Visit       Endocrine   Diabetic polyneuropathy associated with type 2 diabetes mellitus ([place])   Other Visit Diagnoses     Ulcer of foot, limited to breakdown of skin, unspecified laterality ([place])    -  Primary   Relevant Orders   WOUND CULTURE   Cellulitis of fifth toe of left foot       Leg ulcer, right, limited to breakdown of skin ([place])       Pre-ulcerative calluses        Vascular dementia without behavioral disturbance ([place])           -Patient seen and evaluated -Mechanically debrided all preulcerative lesions/calluses using a sterile 15 blade with no underlying opening noted to any of the other both lesions. -Right leg/Achilles ulcer: Using a sterile tissue nipper debridement was performed to healthy bleeding hemostasis was achieved with manual pressure all night nonviable wound tissue  was excised sharply using tissue nipper from the wound bed and then cleansed with a saline moist gauze dedbrided ulceration to healthy bleeding borders. Wound measures post debridement as listed above. Wound was debrided to the level of the dermis with viable wound base exposed to promote healing. Hemostasis was achieved with manuel pressure. Patient tolerated procedure well without any discomfort or anesthesia necessary for this wound debridement.  Wound culture obtained for surveillance.  Applied Medihoney the central wound and Betadine periwound and dry sterile dressing and instructed patient to continue with daily dressings at home consisting of the same every other day alternating between Prisma and Medihoney like previous -Left Sub met 5: Using a sterile 15 blade mechanically debrided preulcerative callus which reveals seropurulent drainage and an ulcerated wound base that was further debrided to healthy bleeding tissue the drainage was cultured and sent to microbiology for further analysis preemptively dressed area with Betadine and dry dressing and advised order to do the same and also started patient on Bactrim antibiotic with Diflucan since this has worked very well before for her in the past until we can get the final culture results back - Advised patient to go to the ER or return to office if the wound worsens or if constitutional symptoms are present. -Discussed with daughter proper fitting shoes and again to encourage patient to wear shoes in the house to prevent rubbing  of these preulcerative areas or worsening her current ulcers/wounds -Return to office in 2 weeks for continued wound care or sooner if problems arise.  Request to stay will authorize for visits for ongoing wound care.  Landis Martins, DPM

## 2021-02-21 LAB — WOUND CULTURE

## 2021-02-26 ENCOUNTER — Ambulatory Visit: Payer: No Typology Code available for payment source | Admitting: Sports Medicine

## 2021-02-28 ENCOUNTER — Ambulatory Visit: Payer: No Typology Code available for payment source | Admitting: Sports Medicine

## 2021-03-12 ENCOUNTER — Ambulatory Visit (INDEPENDENT_AMBULATORY_CARE_PROVIDER_SITE_OTHER): Payer: No Typology Code available for payment source | Admitting: Sports Medicine

## 2021-03-12 ENCOUNTER — Encounter: Payer: Self-pay | Admitting: Sports Medicine

## 2021-03-12 DIAGNOSIS — L97911 Non-pressure chronic ulcer of unspecified part of right lower leg limited to breakdown of skin: Secondary | ICD-10-CM

## 2021-03-12 DIAGNOSIS — E1142 Type 2 diabetes mellitus with diabetic polyneuropathy: Secondary | ICD-10-CM

## 2021-03-12 DIAGNOSIS — L03032 Cellulitis of left toe: Secondary | ICD-10-CM

## 2021-03-12 DIAGNOSIS — L97421 Non-pressure chronic ulcer of left heel and midfoot limited to breakdown of skin: Secondary | ICD-10-CM

## 2021-03-12 DIAGNOSIS — L84 Corns and callosities: Secondary | ICD-10-CM

## 2021-03-12 DIAGNOSIS — L97501 Non-pressure chronic ulcer of other part of unspecified foot limited to breakdown of skin: Secondary | ICD-10-CM

## 2021-03-12 DIAGNOSIS — F0150 Vascular dementia without behavioral disturbance: Secondary | ICD-10-CM

## 2021-03-12 MED ORDER — FLUCONAZOLE 150 MG PO TABS
150.0000 mg | ORAL_TABLET | Freq: Every day | ORAL | 1 refills | Status: DC
Start: 2021-03-12 — End: 2021-03-26

## 2021-03-12 NOTE — Progress Notes (Signed)
Subjective: Cindy Hahn is a 86 y.o. female patient seen today in office for follow-up wound care.  Patient has multiple complaints states that she feels very sad and depressed and wants to be put back on her Xanax.  Patient also states that when she walks or stands it feels like her knees are going to give out from underneath her currently using walker.  Patient also reports that she had an ultrasound done on the leg because of the left leg is very swollen.  Patient is assisted by daughter who helps to report this history and states that she has noticed swelling on both and that her mom needs to follow-up with primary doctor or cardiologist right now they no longer have a primary doctor because they are saying doctors at stay well. No other pedal complaints noted.  Patient Active Problem List   Diagnosis Date Noted   Breast cancer in female [facility]) 08/27/2020   Lewy body dementia ([place]) 04/24/2020   Hyperlipidemia 02/01/2020   Subdural hematoma 02/01/2020   Osteomyelitis ([place]) 10/17/2018   Diabetic foot ulcer ([place]) 05/05/2018   History of CVA (cerebrovascular accident) 05/05/2018   Dementia ([place]) 05/05/2018   Diabetic polyneuropathy associated with type 2 diabetes mellitus ([place]) 06/04/2015   Pacemaker 03/11/2015   Bradycardia 02/06/2015   Chronic atrial fibrillation ([place]) 11/01/2014   Essential hypertension 11/01/2014   Diabetes mellitus ([place]) 11/01/2014    Current Outpatient Medications on File Prior to Visit  Medication Sig Dispense Refill   acetaminophen (TYLENOL) 325 MG tablet Take by mouth.     ALPRAZolam (XANAX) 0.25 MG tablet      ALPRAZolam (XANAX) 1 MG tablet Take 1 mg by mouth.     ALPRAZolam (XANAX) 1 MG tablet Take by mouth.     ampicillin (PRINCIPEN) 500 MG capsule      anastrozole (ARIMIDEX) 1 MG tablet Take 1 tablet by mouth daily.     aspirin 81 MG EC tablet Take by mouth.     aspirin EC 81 MG tablet Take 81 mg by mouth.     BD INSULIN SYRINGE U/F 31G X 5/16" 1 ML  MISC      cefTRIAXone (ROCEPHIN) 1 g injection      cefUROXime (CEFTIN) 250 MG tablet      Cholecalciferol 125 MCG (5000 UT) TABS Take by mouth.     collagenase (SANTYL) ointment Apply 1 application topically daily. 30 g 0   Cranberry-Vitamin C-Inulin (UTI-STAT) LIQD Take by mouth.     digoxin (LANOXIN) 0.125 MG tablet Take 0.0625 mg by mouth daily.   0   digoxin (LANOXIN) 0.125 MG tablet Take by mouth.     diltiazem (CARDIZEM) 30 MG tablet      diltiazem (CARDIZEM) 90 MG tablet Take 1 tablet (90 mg total) by mouth 2 (two) times daily. Please make overdue appt with Dr. Curt Bears before anymore refills. 1st attempt 60 tablet 0   donepezil (ARICEPT) 5 MG tablet Take by mouth.     doxycycline (VIBRA-TABS) 100 MG tablet Take 1 tablet (100 mg total) by mouth 2 (two) times daily. 20 tablet 0   doxycycline (VIBRAMYCIN) 100 MG capsule      ertapenem (INVANZ) 1 g injection      fosfomycin (MONUROL) 3 g PACK SMARTSIG:1 Packet(s) By Mouth     gabapentin (NEURONTIN) 100 MG capsule Take 1 capsule (100 mg total) by mouth at bedtime. 30 capsule 3   insulin glargine (LANTUS) 100 UNIT/ML injection Inject into the skin.  insulin regular (NOVOLIN R) 100 units/mL injection Inject into the skin.     LEVEMIR 100 UNIT/ML injection Inject 74 Units into the skin.      lidocaine (XYLOCAINE) 1 % (with preservative) injection      memantine (NAMENDA) 5 MG tablet Take by mouth.     metoprolol tartrate (LOPRESSOR) 100 MG tablet Take 1 tablet (100 mg total) by mouth 2 (two) times daily. 180 tablet 3   Multiple Vitamin (QUINTABS) TABS Take 1 tablet by mouth daily.     naproxen sodium (ALEVE) 220 MG tablet Take 220 mg by mouth daily.     nitrofurantoin, macrocrystal-monohydrate, (MACROBID) 100 MG capsule      NOVOLOG FLEXPEN 100 UNIT/ML FlexPen inject 10 units SUBCUTANEOUSLY WITH MAIN MEAL  0   NYAMYC powder      nystatin cream (MYCOSTATIN)      potassium chloride SA (K-DUR,KLOR-CON) 20 MEQ tablet Take 20 mEq by mouth  daily.   0   risperiDONE (RISPERDAL) 0.5 MG tablet      rosuvastatin (CRESTOR) 20 MG tablet   0   sertraline (ZOLOFT) 100 MG tablet      sertraline (ZOLOFT) 100 MG tablet Take 1 tablet by mouth daily.     sertraline (ZOLOFT) 50 MG tablet      silver nitrate applicators 70-96 % applicator Apply to wound every other day then cover with guaze and papertape 100 each 0   sulfamethoxazole-trimethoprim (BACTRIM) 400-80 MG tablet Take 1 tablet by mouth 2 (two) times daily. 28 tablet 0   No current facility-administered medications on file prior to visit.    Allergies  Allergen Reactions   Levofloxacin Other (See Comments)    confusion   Oxycodone     Objective: There were no vitals filed for this visit.  General: No acute distress, AAOx3  Right posterior heel/leg open wound of Achilles 1.0 x 0.9 cm smaller than previous with a granular base and decreased periwound swelling with slightly rolled borders with marginal  periwound erythema and maceration, no warmth no active drainage no malodor.  Right sub met 1 and 3 pre-ulcerative callus with no opening or signs of infection  Preulcerative callus noted submet 4 and 1on the left upon paring on the preulcerative callus sub met 4 on the left there was ulcerated wound base that measures less than 0.9 x 0.6 cm.  To the left central heel there is mild reactive keratosis previous ulcer healed.  Upon paring of this there was a small amount of bleeding noted to the heel.  Abrasion noted to multiple toes bilateral and left posterior leg with no underlying opening or signs of infection.  Nails x 10 are mildly short and thickened consistent with onychomycosis.  Capillary fill time <3 seconds in all digits, gross sensation present via light touch bilateral.  No pain to palpation bilateral.  No pain with calf compression.  Assessment and Plan:  Problem List Items Addressed This Visit       Endocrine   Diabetic polyneuropathy associated with type 2  diabetes mellitus ([place])   Other Visit Diagnoses     Ulcer of foot, limited to breakdown of skin, unspecified laterality ([place])    -  Primary   Cellulitis of fifth toe of left foot       Leg ulcer, right, limited to breakdown of skin ([place])       Pre-ulcerative calluses       Vascular dementia without behavioral disturbance ([place])  Heel ulcer, left, limited to breakdown of skin ([place])           -Patient seen and evaluated -Discussed with patient to discuss her concerns about her depression and requests for Xanax with PCP -Discussed with patient and daughter to have PCP to evaluate extent of swelling and recommend referral to cardiology since swelling is bilateral even though it is worse on the left -Mechanically debrided all preulcerative lesions/calluses using a sterile 15 blade with no underlying opening noted to any of the other both lesions. -Right leg/Achilles ulcer: Using a sterile tissue nipper debridement was performed to healthy bleeding hemostasis was achieved with manual pressure all night nonviable wound tissue was excised sharply using tissue nipper from the wound bed and then cleansed with a saline moist gauze dedbrided ulceration to healthy bleeding borders. Wound measures post debridement as listed above. Wound was debrided to the level of the dermis with viable wound base exposed to promote healing. Hemostasis was achieved with manuel pressure. Patient tolerated procedure well without any discomfort or anesthesia necessary for this wound debridement.  Applied Medihoney the central wound and Betadine periwound and dry sterile dressing and instructed patient to continue with daily dressings at home consisting of the same every other day alternating between Prisma and Medihoney like previous -Left Sub met 4: Using a sterile 15 blade mechanically debrided preulcerative callus which revealed ulcerated base of which I applied a small amount of Iodosorb and Band-Aid dressing to this  area -Left central heel and all other preulcerative calluses debrided using a sterile chisel blade without incident and applied silver nitrate to any of the iatrogenic bleeding areas -Gave patient samples of Nuzyra antibiotic for preventative measures for faint erythema noted to the left lower extremity -Verbally previous left leg ultrasound negative - Advised patient to go to the ER or return to office if the wound worsens or if constitutional symptoms are present. -Return to office in 2 weeks for continued wound care or sooner problems arise.  Patient will continue to need twice monthly visits for wound care.  Landis Martins, DPM

## 2021-03-26 ENCOUNTER — Encounter: Payer: Self-pay | Admitting: Sports Medicine

## 2021-03-26 ENCOUNTER — Ambulatory Visit (INDEPENDENT_AMBULATORY_CARE_PROVIDER_SITE_OTHER): Payer: No Typology Code available for payment source | Admitting: Sports Medicine

## 2021-03-26 DIAGNOSIS — L84 Corns and callosities: Secondary | ICD-10-CM

## 2021-03-26 DIAGNOSIS — S90425A Blister (nonthermal), left lesser toe(s), initial encounter: Secondary | ICD-10-CM

## 2021-03-26 DIAGNOSIS — L97911 Non-pressure chronic ulcer of unspecified part of right lower leg limited to breakdown of skin: Secondary | ICD-10-CM

## 2021-03-26 DIAGNOSIS — L97501 Non-pressure chronic ulcer of other part of unspecified foot limited to breakdown of skin: Secondary | ICD-10-CM

## 2021-03-26 MED ORDER — FLUCONAZOLE 150 MG PO TABS: 150.0000 mg | ORAL_TABLET | Freq: Every day | ORAL | 1 refills | Status: DC

## 2021-03-26 MED ORDER — FLUCONAZOLE 150 MG PO TABS: 150.0000 mg | ORAL_TABLET | Freq: Every day | ORAL | 1 refills | Status: AC

## 2021-03-26 NOTE — Progress Notes (Signed)
Subjective: Cindy Hahn is a 86 y.o. female patient seen today in office for follow-up wound care.  Patient is assisted by son this visit because daughter is out of town in [place].  Patient states that she has some spots on her feet they seem to be doing okay not draining.  Daughter did dressing on Tuesday before leaving. No other pedal complaints noted.  Patient Active Problem List   Diagnosis Date Noted   Breast cancer in female [facility]) 08/27/2020   Lewy body dementia ([place]) 04/24/2020   Hyperlipidemia 02/01/2020   Subdural hematoma 02/01/2020   Osteomyelitis ([place]) 10/17/2018   Diabetic foot ulcer ([place]) 05/05/2018   History of CVA (cerebrovascular accident) 05/05/2018   Dementia ([place]) 05/05/2018   Diabetic polyneuropathy associated with type 2 diabetes mellitus ([place]) 06/04/2015   Pacemaker 03/11/2015   Bradycardia 02/06/2015   Chronic atrial fibrillation ([place]) 11/01/2014   Essential hypertension 11/01/2014   Diabetes mellitus ([place]) 11/01/2014    Current Outpatient Medications on File Prior to Visit  Medication Sig Dispense Refill   acetaminophen (TYLENOL) 325 MG tablet Take by mouth.     ALPRAZolam (XANAX) 0.25 MG tablet      ALPRAZolam (XANAX) 1 MG tablet Take 1 mg by mouth.     ALPRAZolam (XANAX) 1 MG tablet Take by mouth.     ampicillin (PRINCIPEN) 500 MG capsule      anastrozole (ARIMIDEX) 1 MG tablet Take 1 tablet by mouth daily.     aspirin 81 MG EC tablet Take by mouth.     aspirin EC 81 MG tablet Take 81 mg by mouth.     BD INSULIN SYRINGE U/F 31G X 5/16" 1 ML MISC      cefTRIAXone (ROCEPHIN) 1 g injection      cefUROXime (CEFTIN) 250 MG tablet      Cholecalciferol 125 MCG (5000 UT) TABS Take by mouth.     collagenase (SANTYL) ointment Apply 1 application topically daily. 30 g 0   Cranberry-Vitamin C-Inulin (UTI-STAT) LIQD Take by mouth.     digoxin (LANOXIN) 0.125 MG tablet Take 0.0625 mg by mouth daily.   0   digoxin (LANOXIN) 0.125 MG tablet Take by mouth.      diltiazem (CARDIZEM) 30 MG tablet      diltiazem (CARDIZEM) 90 MG tablet Take 1 tablet (90 mg total) by mouth 2 (two) times daily. Please make overdue appt with Dr. Curt Bears before anymore refills. 1st attempt 60 tablet 0   donepezil (ARICEPT) 5 MG tablet Take by mouth.     doxycycline (VIBRA-TABS) 100 MG tablet Take 1 tablet (100 mg total) by mouth 2 (two) times daily. 20 tablet 0   doxycycline (VIBRAMYCIN) 100 MG capsule      ertapenem (INVANZ) 1 g injection      fosfomycin (MONUROL) 3 g PACK SMARTSIG:1 Packet(s) By Mouth     gabapentin (NEURONTIN) 100 MG capsule Take 1 capsule (100 mg total) by mouth at bedtime. 30 capsule 3   insulin glargine (LANTUS) 100 UNIT/ML injection Inject into the skin.     insulin regular (NOVOLIN R) 100 units/mL injection Inject into the skin.     LEVEMIR 100 UNIT/ML injection Inject 74 Units into the skin.      lidocaine (XYLOCAINE) 1 % (with preservative) injection      memantine (NAMENDA) 5 MG tablet Take by mouth.     metoprolol tartrate (LOPRESSOR) 100 MG tablet Take 1 tablet (100 mg total) by mouth 2 (two) times daily. [place]  tablet 3   Multiple Vitamin (QUINTABS) TABS Take 1 tablet by mouth daily.     naproxen sodium (ALEVE) 220 MG tablet Take 220 mg by mouth daily.     nitrofurantoin, macrocrystal-monohydrate, (MACROBID) 100 MG capsule      NOVOLOG FLEXPEN 100 UNIT/ML FlexPen inject 10 units SUBCUTANEOUSLY WITH MAIN MEAL  0   NYAMYC powder      nystatin cream (MYCOSTATIN)      PAXLOVID, 150/100, 10 x 150 MG & 10 x 100MG TBPK SMARTSIG:Unspecified By Mouth As Directed     potassium chloride SA (K-DUR,KLOR-CON) 20 MEQ tablet Take 20 mEq by mouth daily.   0   prednisoLONE acetate (PRED FORTE) 1 % ophthalmic suspension SMARTSIG:In Eye(s)     risperiDONE (RISPERDAL) 0.5 MG tablet      rosuvastatin (CRESTOR) 20 MG tablet   0   sertraline (ZOLOFT) 100 MG tablet      sertraline (ZOLOFT) 100 MG tablet Take 1 tablet by mouth daily.     sertraline (ZOLOFT) 50 MG  tablet      silver nitrate applicators 00-71 % applicator Apply to wound every other day then cover with guaze and papertape 100 each 0   sulfamethoxazole-trimethoprim (BACTRIM) 400-80 MG tablet Take 1 tablet by mouth 2 (two) times daily. 28 tablet 0   tobramycin (TOBREX) 0.3 % ophthalmic solution Place into the right eye.     No current facility-administered medications on file prior to visit.    Allergies  Allergen Reactions   Levofloxacin Other (See Comments)    confusion   Oxycodone     Objective: There were no vitals filed for this visit.  General: No acute distress, AAOx3  Right posterior heel/leg open wound of Achilles 0.7 x 0.5 cm smaller than previous with a granular base and decreased periwound swelling with slightly rolled borders with marginal  periwound erythema and maceration, no warmth no active drainage no malodor.  Right sub met 1 and 3 pre-ulcerative callus with no opening or signs of infection  Preulcerative callus noted submet 1on the left   Partial thickness ulceration present preulcerative callus sub met 4 on the left that measures 0.4 x 0.3 cm with a granular base and very mild periwound keratosis.  There is no significant redness swelling drainage course acute signs of infection.  To the left central heel there is mild reactive keratosis with silver nitrate present.  There are new clear fluid blisters noted to the left first and second toes likely still lifted from patient stating too close to the heater with no surrounding signs of infection.  Abrasion noted to multiple toes bilateral and left posterior leg with no underlying opening or signs of infection.  Nails x 10 are mildly short and thickened consistent with onychomycosis.  Capillary fill time <3 seconds in all digits, gross sensation present via light touch bilateral.  No pain to palpation bilateral.  No pain with calf compression.  Assessment and Plan:  Problem List Items Addressed This Visit    None Visit Diagnoses     Blister of toe of left foot, initial encounter    -  Primary   Ulcer of foot, limited to breakdown of skin, unspecified laterality ([place])       Leg ulcer, right, limited to breakdown of skin ([place])       Pre-ulcerative calluses           -Patient seen and evaluated -Discussed with patient to discuss her concerns about her wound and new concern  of a yeast infection -Prescribed Diflucan for yeast for patient to take and advised her if it does not get better should follow-up with PCP -Mechanically debrided all preulcerative lesions/calluses using a sterile 15 blade with no underlying opening noted to preulcerative lesions bilateral. -Right leg/Achilles ulcer: Using a sterile tissue nipper debridement was performed to healthy bleeding hemostasis was achieved with manual pressure all night nonviable wound tissue was excised sharply using tissue nipper from the wound bed and then cleansed with a saline moist gauze dedbrided ulceration to healthy bleeding borders. Wound measures post debridement as listed above. Wound was debrided to the level of the dermis with viable wound base exposed to promote healing. Hemostasis was achieved with manuel pressure. Patient tolerated procedure well without any discomfort or anesthesia necessary for this wound debridement.  Applied Medihoney and Band-Aid dressing and advised son to continue to help with Medihoney every other day to the areas until daughter can come back to help. -Left Sub met 4: Using a sterile 15 blade mechanically debrided preulcerative callus which revealed ulcerated base of which I applied a small amount of I Medihoney and Band-Aid dressing to this area -Left central heel and all other preulcerative calluses apply protective Band-Aid -Lanced using a 15 blade the clear fluid blisters to the left hallux and second toe and applied Medihoney and Band-Aid dressing for patient to do the same with assistance from son every other  day - Advised patient to go to the ER or return to office if the wound worsens or if constitutional symptoms are present. -Return to office in 2 weeks for continued wound care or sooner problems arise.  Patient will continue to need twice monthly visits for wound care like previous since these are chronic in nature.  Landis Martins, DPM

## 2021-04-09 ENCOUNTER — Encounter: Payer: Self-pay | Admitting: Sports Medicine

## 2021-04-09 ENCOUNTER — Ambulatory Visit (INDEPENDENT_AMBULATORY_CARE_PROVIDER_SITE_OTHER): Payer: No Typology Code available for payment source | Admitting: Sports Medicine

## 2021-04-09 DIAGNOSIS — L97501 Non-pressure chronic ulcer of other part of unspecified foot limited to breakdown of skin: Secondary | ICD-10-CM | POA: Diagnosis not present

## 2021-04-09 DIAGNOSIS — F0150 Vascular dementia without behavioral disturbance: Secondary | ICD-10-CM

## 2021-04-09 DIAGNOSIS — L84 Corns and callosities: Secondary | ICD-10-CM

## 2021-04-09 DIAGNOSIS — E1142 Type 2 diabetes mellitus with diabetic polyneuropathy: Secondary | ICD-10-CM

## 2021-04-09 DIAGNOSIS — L97911 Non-pressure chronic ulcer of unspecified part of right lower leg limited to breakdown of skin: Secondary | ICD-10-CM

## 2021-04-09 DIAGNOSIS — S90425D Blister (nonthermal), left lesser toe(s), subsequent encounter: Secondary | ICD-10-CM

## 2021-04-09 NOTE — Progress Notes (Signed)
Subjective: Cindy Hahn is a 86 y.o. female patient seen today in office for follow-up wound care.  Patient is assisted by daughter this visit.  Patient denies any pain or complaints but is concerned about bug bites. No other pedal complaints noted.  Patient Active Problem List   Diagnosis Date Noted   Breast cancer in female [facility]) 08/27/2020   Lewy body dementia ([place]) 04/24/2020   Hyperlipidemia 02/01/2020   Subdural hematoma 02/01/2020   Osteomyelitis ([place]) 10/17/2018   Diabetic foot ulcer ([place]) 05/05/2018   History of CVA (cerebrovascular accident) 05/05/2018   Dementia ([place]) 05/05/2018   Diabetic polyneuropathy associated with type 2 diabetes mellitus ([place]) 06/04/2015   Pacemaker 03/11/2015   Bradycardia 02/06/2015   Chronic atrial fibrillation ([place]) 11/01/2014   Essential hypertension 11/01/2014   Diabetes mellitus ([place]) 11/01/2014    Current Outpatient Medications on File Prior to Visit  Medication Sig Dispense Refill   acetaminophen (TYLENOL) 325 MG tablet Take by mouth.     ALPRAZolam (XANAX) 0.25 MG tablet      ALPRAZolam (XANAX) 1 MG tablet Take 1 mg by mouth.     ALPRAZolam (XANAX) 1 MG tablet Take by mouth.     ampicillin (PRINCIPEN) 500 MG capsule      anastrozole (ARIMIDEX) 1 MG tablet Take 1 tablet by mouth daily.     aspirin 81 MG EC tablet Take by mouth.     aspirin EC 81 MG tablet Take 81 mg by mouth.     BD INSULIN SYRINGE U/F 31G X 5/16" 1 ML MISC      cefTRIAXone (ROCEPHIN) 1 g injection      cefUROXime (CEFTIN) 250 MG tablet      Cholecalciferol 125 MCG (5000 UT) TABS Take by mouth.     collagenase (SANTYL) ointment Apply 1 application topically daily. 30 g 0   Cranberry-Vitamin C-Inulin (UTI-STAT) LIQD Take by mouth.     digoxin (LANOXIN) 0.125 MG tablet Take 0.0625 mg by mouth daily.   0   digoxin (LANOXIN) 0.125 MG tablet Take by mouth.     diltiazem (CARDIZEM) 30 MG tablet      diltiazem (CARDIZEM) 90 MG tablet Take 1 tablet (90 mg total) by  mouth 2 (two) times daily. Please make overdue appt with Dr. Curt Bears before anymore refills. 1st attempt 60 tablet 0   donepezil (ARICEPT) 5 MG tablet Take by mouth.     doxycycline (VIBRA-TABS) 100 MG tablet Take 1 tablet (100 mg total) by mouth 2 (two) times daily. 20 tablet 0   doxycycline (VIBRAMYCIN) 100 MG capsule      ertapenem (INVANZ) 1 g injection      fluconazole (DIFLUCAN) 150 MG tablet Take 1 tablet (150 mg total) by mouth daily. 1 tablet 1   fosfomycin (MONUROL) 3 g PACK SMARTSIG:1 Packet(s) By Mouth     gabapentin (NEURONTIN) 100 MG capsule Take 1 capsule (100 mg total) by mouth at bedtime. 30 capsule 3   insulin glargine (LANTUS) 100 UNIT/ML injection Inject into the skin.     insulin regular (NOVOLIN R) 100 units/mL injection Inject into the skin.     LEVEMIR 100 UNIT/ML injection Inject 74 Units into the skin.      lidocaine (XYLOCAINE) 1 % (with preservative) injection      memantine (NAMENDA) 5 MG tablet Take by mouth.     metoprolol tartrate (LOPRESSOR) 100 MG tablet Take 1 tablet (100 mg total) by mouth 2 (two) times daily. 180 tablet 3  Multiple Vitamin (QUINTABS) TABS Take 1 tablet by mouth daily.     naproxen sodium (ALEVE) 220 MG tablet Take 220 mg by mouth daily.     nitrofurantoin, macrocrystal-monohydrate, (MACROBID) 100 MG capsule      NOVOLOG FLEXPEN 100 UNIT/ML FlexPen inject 10 units SUBCUTANEOUSLY WITH MAIN MEAL  0   NYAMYC powder      nystatin cream (MYCOSTATIN)      PAXLOVID, 150/100, 10 x 150 MG & 10 x 100MG TBPK SMARTSIG:Unspecified By Mouth As Directed     potassium chloride SA (K-DUR,KLOR-CON) 20 MEQ tablet Take 20 mEq by mouth daily.   0   prednisoLONE acetate (PRED FORTE) 1 % ophthalmic suspension SMARTSIG:In Eye(s)     risperiDONE (RISPERDAL) 0.5 MG tablet      rosuvastatin (CRESTOR) 20 MG tablet   0   sertraline (ZOLOFT) 100 MG tablet      sertraline (ZOLOFT) 100 MG tablet Take 1 tablet by mouth daily.     sertraline (ZOLOFT) 50 MG tablet       silver nitrate applicators 02-11 % applicator Apply to wound every other day then cover with guaze and papertape 100 each 0   sulfamethoxazole-trimethoprim (BACTRIM) 400-80 MG tablet Take 1 tablet by mouth 2 (two) times daily. 28 tablet 0   tobramycin (TOBREX) 0.3 % ophthalmic solution Place into the right eye.     No current facility-administered medications on file prior to visit.    Allergies  Allergen Reactions   Levofloxacin Other (See Comments)    confusion   Oxycodone     Objective: There were no vitals filed for this visit.  General: No acute distress, AAOx3  Right posterior heel/leg open wound of Achilles 0.6 x 0.5 cm smaller than previous with a granular base and decreased periwound swelling with slightly rolled borders with marginal  periwound erythema and maceration, no warmth no active drainage no malodor.  Right sub met 1 and 3 pre-ulcerative callus with no opening or signs of infection  Preulcerative callus noted submet 1on the left   Partial thickness ulceration present preulcerative callus sub met 4 on the left that measures 0.5 x 0.6 cm with a granular base and very mild periwound keratosis.  There is no significant redness swelling drainage course acute signs of infection.  To the left central heel there is mild reactive keratosis with silver nitrate present.  There are deroofed blisters noted to the left hallux and second toe that appear to be granulating in with no surrounding signs of infection.  Abrasion noted to multiple toes bilateral and left posterior leg with no underlying opening or signs of infection.  No obvious evidence of bug bites however patient thinks that she does get this when she sits out on the porch.  Nails x 10 are mildly short and thickened consistent with onychomycosis.  Capillary fill time <3 seconds in all digits, gross sensation present via light touch bilateral.  No pain to palpation bilateral.  No pain with calf  compression.  Assessment and Plan:  Problem List Items Addressed This Visit       Endocrine   Diabetic polyneuropathy associated with type 2 diabetes mellitus ([place])   Other Visit Diagnoses     Blister of toe of left foot, subsequent encounter    -  Primary   Ulcer of foot, limited to breakdown of skin, unspecified laterality ([place])       Leg ulcer, right, limited to breakdown of skin ([place])  Pre-ulcerative calluses       Vascular dementia without behavioral disturbance ([place])           -Patient seen and evaluated -Mechanically debrided all preulcerative lesions/calluses using a sterile 15 blade with no underlying opening noted to preulcerative lesions bilateral. -Right leg/Achilles ulcer: Using a sterile tissue nipper debridement was performed to healthy bleeding hemostasis was achieved with manual pressure all night nonviable wound tissue was excised sharply using tissue nipper from the wound bed and then cleansed with a saline moist gauze dedbrided ulceration to healthy bleeding borders. Wound measures post debridement as listed above. Wound was debrided to the level of the dermis with viable wound base exposed to promote healing. Hemostasis was achieved with manuel pressure. Patient tolerated procedure well without any discomfort or anesthesia necessary for this wound debridement.  Applied Prisma and Band-Aid dressing and advised daughter to do the same and may alternate between Medihoney as needed -Left Sub met 4: Using a sterile 15 blade mechanically debrided preulcerative callus which revealed ulcerated base of which I applied a small amount of Iodosorb and gauze dressing to the area -Left central heel and all other preulcerative calluses apply protective amount of silver nitrate -Applied Betadine and dry dressing to the blister of the left hallux and left second toe and advised daughter to do the same every day - Advised patient to go to the ER or return to office if the wound  worsens or if constitutional symptoms are present. -Return to office in 2-3 weeks for continued wound care or sooner problems arise.  Patient will continue to recurrent visits for wound care like previous since these are chronic in nature.  Landis Martins, DPM

## 2021-04-30 ENCOUNTER — Telehealth: Payer: Self-pay | Admitting: *Deleted

## 2021-04-30 NOTE — Telephone Encounter (Signed)
Cindy Hahn called from [facility] [phone]) and left a message stating that the family was wanting to let Dr Cannon Kettle now that the patient would be going to the wound care center and also to see Dr Cannon Kettle and I relayed the message per Dr Cannon Kettle and per Dr Cannon Kettle they can go to the wound care center and if any concerns as far as surgery management goes we can see the patient for that and to call the office if any concerns or questions. Lattie Haw

## 2021-05-02 ENCOUNTER — Ambulatory Visit: Payer: No Typology Code available for payment source | Admitting: Sports Medicine

## 2021-06-18 ENCOUNTER — Ambulatory Visit (INDEPENDENT_AMBULATORY_CARE_PROVIDER_SITE_OTHER): Payer: No Typology Code available for payment source | Admitting: Sports Medicine

## 2021-06-18 ENCOUNTER — Encounter: Payer: Self-pay | Admitting: Sports Medicine

## 2021-06-18 ENCOUNTER — Other Ambulatory Visit: Payer: Self-pay

## 2021-06-18 DIAGNOSIS — E1142 Type 2 diabetes mellitus with diabetic polyneuropathy: Secondary | ICD-10-CM

## 2021-06-18 DIAGNOSIS — F0150 Vascular dementia without behavioral disturbance: Secondary | ICD-10-CM

## 2021-06-18 DIAGNOSIS — L97501 Non-pressure chronic ulcer of other part of unspecified foot limited to breakdown of skin: Secondary | ICD-10-CM | POA: Diagnosis not present

## 2021-06-18 DIAGNOSIS — L84 Corns and callosities: Secondary | ICD-10-CM

## 2021-06-18 DIAGNOSIS — L97911 Non-pressure chronic ulcer of unspecified part of right lower leg limited to breakdown of skin: Secondary | ICD-10-CM

## 2021-06-18 NOTE — Progress Notes (Signed)
Subjective: ?Cindy Hahn is a 87 y.o. female patient seen today in office for follow-up wound care and nail care.  Patient is assisted by caregiver from [facility].  ? ?Patient Active Problem List  ? Diagnosis Date Noted  ? Breast cancer in female [facility]) 08/27/2020  ? Lewy body dementia ([place]) 04/24/2020  ? Hyperlipidemia 02/01/2020  ? Subdural hematoma 02/01/2020  ? Osteomyelitis ([place]) 10/17/2018  ? Diabetic foot ulcer ([place]) 05/05/2018  ? History of CVA (cerebrovascular accident) 05/05/2018  ? Dementia ([place]) 05/05/2018  ? Diabetic polyneuropathy associated with type 2 diabetes mellitus ([place]) 06/04/2015  ? Pacemaker 03/11/2015  ? Bradycardia 02/06/2015  ? Chronic atrial fibrillation ([place]) 11/01/2014  ? Essential hypertension 11/01/2014  ? Diabetes mellitus ([place]) 11/01/2014  ? ? ?Current Outpatient Medications on File Prior to Visit  ?Medication Sig Dispense Refill  ? acetaminophen (TYLENOL) 325 MG tablet Take by mouth.    ? ALPRAZolam (XANAX) 1 MG tablet Take 1 mg by mouth.    ? ampicillin (PRINCIPEN) 500 MG capsule     ? anastrozole (ARIMIDEX) 1 MG tablet Take 1 tablet by mouth daily.    ? aspirin 81 MG EC tablet Take by mouth.    ? BD INSULIN SYRINGE U/F 31G X 5/16" 1 ML MISC     ? cefTRIAXone (ROCEPHIN) 1 g injection     ? cefUROXime (CEFTIN) 250 MG tablet     ? Cholecalciferol 125 MCG (5000 UT) TABS Take by mouth.    ? collagenase (SANTYL) ointment Apply 1 application topically daily. 30 g 0  ? Cranberry-Vitamin C-Inulin (UTI-STAT) LIQD Take by mouth.    ? digoxin (LANOXIN) 0.125 MG tablet Take by mouth.    ? diltiazem (CARDIZEM) 30 MG tablet     ? diltiazem (CARDIZEM) 90 MG tablet Take 1 tablet (90 mg total) by mouth 2 (two) times daily. Please make overdue appt with Dr. Curt Bears before anymore refills. 1st attempt 60 tablet 0  ? donepezil (ARICEPT) 5 MG tablet Take by mouth.    ? ertapenem (INVANZ) 1 g injection     ? fluconazole (DIFLUCAN) 150 MG tablet Take 1 tablet (150 mg total) by mouth daily. 1  tablet 1  ? fosfomycin (MONUROL) 3 g PACK SMARTSIG:1 Packet(s) By Mouth    ? gabapentin (NEURONTIN) 100 MG capsule Take 1 capsule (100 mg total) by mouth at bedtime. 30 capsule 3  ? insulin glargine (LANTUS) 100 UNIT/ML injection Inject into the skin.    ? insulin regular (NOVOLIN R) 100 units/mL injection Inject into the skin.    ? LEVEMIR 100 UNIT/ML injection Inject 74 Units into the skin.     ? lidocaine (XYLOCAINE) 1 % (with preservative) injection     ? memantine (NAMENDA) 5 MG tablet Take by mouth.    ? metoprolol tartrate (LOPRESSOR) 100 MG tablet Take 1 tablet (100 mg total) by mouth 2 (two) times daily. 180 tablet 3  ? Multiple Vitamin (QUINTABS) TABS Take 1 tablet by mouth daily.    ? naproxen sodium (ALEVE) 220 MG tablet Take 220 mg by mouth daily.    ? nitrofurantoin, macrocrystal-monohydrate, (MACROBID) 100 MG capsule     ? NOVOLOG FLEXPEN 100 UNIT/ML FlexPen inject 10 units SUBCUTANEOUSLY WITH MAIN MEAL  0  ? NYAMYC powder     ? nystatin cream (MYCOSTATIN)     ? PAXLOVID, 150/100, 10 x 150 MG & 10 x 100MG TBPK SMARTSIG:Unspecified By Mouth As Directed    ? potassium chloride SA (K-DUR,KLOR-CON)  20 MEQ tablet Take 20 mEq by mouth daily.   0  ? prednisoLONE acetate (PRED FORTE) 1 % ophthalmic suspension SMARTSIG:In Eye(s)    ? risperiDONE (RISPERDAL) 0.5 MG tablet     ? rosuvastatin (CRESTOR) 20 MG tablet   0  ? sertraline (ZOLOFT) 100 MG tablet     ? sertraline (ZOLOFT) 100 MG tablet Take 1 tablet by mouth daily.    ? sertraline (ZOLOFT) 50 MG tablet     ? silver nitrate applicators 99-35 % applicator Apply to wound every other day then cover with guaze and papertape 100 each 0  ? tobramycin (TOBREX) 0.3 % ophthalmic solution Place into the right eye.    ? ?No current facility-administered medications on file prior to visit.  ? ? ?Allergies  ?Allergen Reactions  ? Levofloxacin Other (See Comments)  ?  confusion  ? Oxycodone   ? ? ?Objective: ?There were no vitals filed for this visit. ? ?General: No  acute distress, AAOx3  ?Right posterior heel/leg open wound of Achilles 0.3x 0.2 cm smaller than previous with a dry flaky scab with a very pinpoint granular base and decreased periwound swelling with slightly rolled borders with marginal  periwound erythema and maceration, no warmth no active drainage no malodor. ? ?Right sub met 1 and 3 pre-ulcerative callus with no opening or signs of infection ? ?Preulcerative callus noted submet 1 and 4 and central heel on the left  ? ?Partial thickness ulceration present preulcerative left fifth toe lateral aspect once debrided granular base that measures 0.5 x 0.7 with mild periwound erythema no malodor no fluctuance no pain to palpation to left fifth toe or any acute signs or symptoms of infection at this time. ? ?Nails x 10 are mildly elongated and thickened consistent with onychomycosis. ? ?Capillary fill time <3 seconds in all digits, gross sensation present via light touch bilateral.  No pain to palpation bilateral.  No pain with calf compression subjectively patient states that she gets sharp pain at her shins. ? ?Assessment and Plan:  ?Problem List Items Addressed This Visit   ? ?  ? Endocrine  ? Diabetic polyneuropathy associated with type 2 diabetes mellitus ([place])  ? ?Other Visit Diagnoses   ? ? Ulcer of foot, limited to breakdown of skin, unspecified laterality ([place])    -  Primary  ? Leg ulcer, right, limited to breakdown of skin ([place])      ? Pre-ulcerative calluses      ? Vascular dementia without behavioral disturbance ([place])      ? ?  ? ? ?-Patient seen and evaluated ?-Mechanically debrided all preulcerative lesions/calluses using a sterile 15 blade with no underlying opening noted to preulcerative lesions bilateral. ?-Right leg/Achilles ulcer: Cleanse wound and applied Medihoney and Band-Aid dressing and advised daughter via text to do the same since she was not present at today's visit ?-Left fifth toe: Mechanically debrided wound to healthy bleeding margins  using a sterile 15 blade hemostasis was achieved with manual pressure patient tolerated the excisional debridement well without need for anesthesia, applied Medihoney and Band-Aid dressing and advised daughter to do the same the attacks since she was not present at today's visit  ?-Advised mom with help of daughter to use heating pad and gentle stretching for any shinsplints ?- Advised patient to go to the ER or return to office if the wound worsens or if constitutional symptoms are present. ?-Continue with assisted care from [facility] ?-Mechanically debrided nails x10 using a sterile  nail nipper without incident at no additional charge ?-Return to office in 1 month for continued wound care or sooner if problems or issues arise. ? ?Landis Martins, DPM ?

## 2021-07-16 ENCOUNTER — Ambulatory Visit: Payer: No Typology Code available for payment source | Admitting: Sports Medicine

## 2021-07-30 ENCOUNTER — Encounter: Payer: Self-pay | Admitting: Sports Medicine

## 2021-07-30 ENCOUNTER — Ambulatory Visit (INDEPENDENT_AMBULATORY_CARE_PROVIDER_SITE_OTHER): Payer: No Typology Code available for payment source | Admitting: Sports Medicine

## 2021-07-30 DIAGNOSIS — L03116 Cellulitis of left lower limb: Secondary | ICD-10-CM

## 2021-07-30 DIAGNOSIS — L84 Corns and callosities: Secondary | ICD-10-CM | POA: Diagnosis not present

## 2021-07-30 DIAGNOSIS — F0150 Vascular dementia without behavioral disturbance: Secondary | ICD-10-CM

## 2021-07-30 DIAGNOSIS — M109 Gout, unspecified: Secondary | ICD-10-CM | POA: Diagnosis not present

## 2021-07-30 DIAGNOSIS — E1142 Type 2 diabetes mellitus with diabetic polyneuropathy: Secondary | ICD-10-CM | POA: Diagnosis not present

## 2021-07-30 DIAGNOSIS — S86012A Strain of left Achilles tendon, initial encounter: Secondary | ICD-10-CM

## 2021-07-30 DIAGNOSIS — L02416 Cutaneous abscess of left lower limb: Secondary | ICD-10-CM

## 2021-07-30 MED ORDER — SULFAMETHOXAZOLE-TRIMETHOPRIM 400-80 MG PO TABS: 1.0000 | ORAL_TABLET | Freq: Two times a day (BID) | ORAL | 0 refills | Status: AC

## 2021-07-30 MED ORDER — COLCHICINE 0.6 MG PO TABS: 0.6000 mg | ORAL_TABLET | Freq: Every day | ORAL | 0 refills | Status: AC

## 2021-07-30 NOTE — Progress Notes (Addendum)
Subjective: ?Cindy Hahn is a 87 y.o. female patient seen today in office for follow-up wound care.  Patient reports for the last 2 days she has had increased pain swelling warmth and redness at the back of her left heel patient denies any known injury or trauma and states that they have been treating her [facility] and ordered a CAT scan for today.  Patient is assisted by assistant from [facility].  Patient reports that the pain is excruciating could barely sleep for the last few nights and states that her daughter gave her a pain pill which helped. ? ?Patient Active Problem List  ? Diagnosis Date Noted  ? Breast cancer in female [facility]) 08/27/2020  ? Lewy body dementia ([place]) 04/24/2020  ? Hyperlipidemia 02/01/2020  ? Subdural hematoma ([place]) 02/01/2020  ? Osteomyelitis ([place]) 10/17/2018  ? Diabetic foot ulcer ([place]) 05/05/2018  ? History of CVA (cerebrovascular accident) 05/05/2018  ? Dementia ([place]) 05/05/2018  ? Diabetic polyneuropathy associated with type 2 diabetes mellitus ([place]) 06/04/2015  ? Pacemaker 03/11/2015  ? Bradycardia 02/06/2015  ? Chronic atrial fibrillation ([place]) 11/01/2014  ? Essential hypertension 11/01/2014  ? Diabetes mellitus ([place]) 11/01/2014  ? ? ?Current Outpatient Medications on File Prior to Visit  ?Medication Sig Dispense Refill  ? acetaminophen (TYLENOL) 325 MG tablet Take by mouth.    ? ALPRAZolam (XANAX) 1 MG tablet Take 1 mg by mouth.    ? ampicillin (PRINCIPEN) 500 MG capsule     ? anastrozole (ARIMIDEX) 1 MG tablet Take 1 tablet by mouth daily.    ? aspirin 81 MG EC tablet Take by mouth.    ? BD INSULIN SYRINGE U/F 31G X 5/16" 1 ML MISC     ? cefTRIAXone (ROCEPHIN) 1 g injection     ? cefUROXime (CEFTIN) 250 MG tablet     ? Cholecalciferol 125 MCG (5000 UT) TABS Take by mouth.    ? collagenase (SANTYL) ointment Apply 1 application topically daily. 30 g 0  ? Cranberry-Vitamin C-Inulin (UTI-STAT) LIQD Take by mouth.    ? digoxin (LANOXIN) 0.125 MG tablet Take by mouth.    ? diltiazem  (CARDIZEM) 30 MG tablet     ? diltiazem (CARDIZEM) 90 MG tablet Take 1 tablet (90 mg total) by mouth 2 (two) times daily. Please make overdue appt with Dr. Curt Bears before anymore refills. 1st attempt 60 tablet 0  ? donepezil (ARICEPT) 5 MG tablet Take by mouth.    ? ertapenem (INVANZ) 1 g injection     ? fluconazole (DIFLUCAN) 150 MG tablet Take 1 tablet (150 mg total) by mouth daily. 1 tablet 1  ? fosfomycin (MONUROL) 3 g PACK SMARTSIG:1 Packet(s) By Mouth    ? gabapentin (NEURONTIN) 100 MG capsule Take 1 capsule (100 mg total) by mouth at bedtime. 30 capsule 3  ? insulin glargine (LANTUS) 100 UNIT/ML injection Inject into the skin.    ? insulin regular (NOVOLIN R) 100 units/mL injection Inject into the skin.    ? LEVEMIR 100 UNIT/ML injection Inject 74 Units into the skin.     ? lidocaine (XYLOCAINE) 1 % (with preservative) injection     ? memantine (NAMENDA) 5 MG tablet Take by mouth.    ? metoprolol tartrate (LOPRESSOR) 100 MG tablet Take 1 tablet (100 mg total) by mouth 2 (two) times daily. 180 tablet 3  ? Multiple Vitamin (QUINTABS) TABS Take 1 tablet by mouth daily.    ? naproxen sodium (ALEVE) 220 MG tablet Take 220 mg by  mouth daily.    ? nitrofurantoin, macrocrystal-monohydrate, (MACROBID) 100 MG capsule     ? NOVOLOG FLEXPEN 100 UNIT/ML FlexPen inject 10 units SUBCUTANEOUSLY WITH MAIN MEAL  0  ? NYAMYC powder     ? nystatin cream (MYCOSTATIN)     ? PAXLOVID, 150/100, 10 x 150 MG & 10 x '100MG'$  TBPK SMARTSIG:Unspecified By Mouth As Directed    ? potassium chloride SA (K-DUR,KLOR-CON) 20 MEQ tablet Take 20 mEq by mouth daily.   0  ? prednisoLONE acetate (PRED FORTE) 1 % ophthalmic suspension SMARTSIG:In Eye(s)    ? risperiDONE (RISPERDAL) 0.5 MG tablet     ? rosuvastatin (CRESTOR) 20 MG tablet   0  ? sertraline (ZOLOFT) 100 MG tablet     ? sertraline (ZOLOFT) 100 MG tablet Take 1 tablet by mouth daily.    ? sertraline (ZOLOFT) 50 MG tablet     ? silver nitrate applicators 01-75 % applicator Apply to wound  every other day then cover with guaze and papertape 100 each 0  ? tobramycin (TOBREX) 0.3 % ophthalmic solution Place into the right eye.    ? ?No current facility-administered medications on file prior to visit.  ? ? ?Allergies  ?Allergen Reactions  ? Levofloxacin Other (See Comments)  ?  confusion  ? Oxycodone   ? ? ?Objective: ?There were no vitals filed for this visit. ? ?General: No acute distress, AAOx3  ?Right posterior heel/leg Achilles area appears to be well-healed with dry scab no underlying opening with unchanged rolled borders and a large scar tissue with marginal  periwound erythema, no warmth, no active drainage no malodor.  No acute signs of infection. ? ?Preulcerative callus noted right plantar first and third metatarsal heads with no underlying opening or signs of infection. ? ?Preulcerative callus noted distal tuft of the second and third toes and submet 1 and 4 and central heel on the left  ? ?There is focal redness warmth swelling and intense pain noted to the left posterior heel along the Achilles insertion concerning for localized cellulitis or abscess versus gout versus tendon tear.  There is no pain to the more proximal calf Cindy Hahn' sign is negative. ? ?Nails x 10 are short and thickened consistent with onychomycosis. ? ?Capillary fill time <3 seconds in all digits, gross sensation present via light touch bilateral.  Increased pain noted to the left posterior Achilles insertion and lower leg. ? ?Assessment and Plan:  ?Problem List Items Addressed This Visit   ? ?  ? Endocrine  ? Diabetic polyneuropathy associated with type 2 diabetes mellitus ([place])  ? ?Other Visit Diagnoses   ? ? Cellulitis and abscess of left leg    -  Primary  ? Relevant Medications  ? sulfamethoxazole-trimethoprim (BACTRIM) 400-80 MG tablet  ? colchicine 0.6 MG tablet  ? Acute gout of left ankle, unspecified cause      ? Possible  ? Relevant Medications  ? sulfamethoxazole-trimethoprim (BACTRIM) 400-80 MG tablet  ?  colchicine 0.6 MG tablet  ? Rupture of left Achilles tendon, initial encounter      ? Possible  ? Pre-ulcerative calluses      ? Vascular dementia without behavioral disturbance ([place])      ? ?  ? ? ? ?-Patient seen and evaluated ?-Discussed treatment options for possible cellulitis with abscess versus gout versus nontraumatic tear of the Achilles tendon on the left ?-Patient is scheduled by Oval Linsey for CT today ?-Prescribed and recommended patient to start for preventative measures on Bactrim  in case there is localized infection as well as colchicine for possible gout flareup since patient does not induce a history of known injury or trauma ?-Recommend ice and elevation 20 minutes twice a day to help with pain and swelling at the left posterior heel ?-Discussed with patient treatment options for preulcerative calluses ?-Mechanically debrided all preulcerative lesions/calluses using a sterile 15 blade with no underlying opening noted to preulcerative lesions bilateral. ?-There was a small amount of bleeding at the left plantar heel with callus trimming that was treated with silver nitrate and a Band-Aid was applied ?-A protective Band-Aid was also applied today at the right posterior heel to prevent rubbing at the now healed Achilles wound ?-Office to contact [facility] to authorize additional appointments for follow-up on Achilles in approximately 3 weeks or sooner if problems or issues arise ? ? ?Landis Martins, DPM ?

## 2021-08-02 DIAGNOSIS — I361 Nonrheumatic tricuspid (valve) insufficiency: Secondary | ICD-10-CM | POA: Diagnosis not present

## 2021-08-03 DIAGNOSIS — I4891 Unspecified atrial fibrillation: Secondary | ICD-10-CM | POA: Diagnosis not present

## 2021-08-06 DIAGNOSIS — N184 Chronic kidney disease, stage 4 (severe): Secondary | ICD-10-CM | POA: Diagnosis not present

## 2021-08-06 DIAGNOSIS — Z950 Presence of cardiac pacemaker: Secondary | ICD-10-CM | POA: Diagnosis not present

## 2021-08-06 DIAGNOSIS — I4891 Unspecified atrial fibrillation: Secondary | ICD-10-CM | POA: Diagnosis not present

## 2021-08-06 DIAGNOSIS — E1110 Type 2 diabetes mellitus with ketoacidosis without coma: Secondary | ICD-10-CM | POA: Diagnosis not present

## 2021-08-07 DIAGNOSIS — I4891 Unspecified atrial fibrillation: Secondary | ICD-10-CM | POA: Diagnosis not present

## 2021-08-07 DIAGNOSIS — Z950 Presence of cardiac pacemaker: Secondary | ICD-10-CM | POA: Diagnosis not present

## 2021-08-07 DIAGNOSIS — I10 Essential (primary) hypertension: Secondary | ICD-10-CM

## 2021-08-07 DIAGNOSIS — E1110 Type 2 diabetes mellitus with ketoacidosis without coma: Secondary | ICD-10-CM | POA: Diagnosis not present

## 2021-08-07 DIAGNOSIS — N184 Chronic kidney disease, stage 4 (severe): Secondary | ICD-10-CM | POA: Diagnosis not present

## 2021-08-08 DIAGNOSIS — I4891 Unspecified atrial fibrillation: Secondary | ICD-10-CM | POA: Diagnosis not present

## 2021-08-08 DIAGNOSIS — Z950 Presence of cardiac pacemaker: Secondary | ICD-10-CM | POA: Diagnosis not present

## 2021-08-08 DIAGNOSIS — N184 Chronic kidney disease, stage 4 (severe): Secondary | ICD-10-CM | POA: Diagnosis not present

## 2021-08-08 DIAGNOSIS — E1110 Type 2 diabetes mellitus with ketoacidosis without coma: Secondary | ICD-10-CM | POA: Diagnosis not present

## 2021-08-09 DIAGNOSIS — E1110 Type 2 diabetes mellitus with ketoacidosis without coma: Secondary | ICD-10-CM

## 2021-08-09 DIAGNOSIS — Z950 Presence of cardiac pacemaker: Secondary | ICD-10-CM | POA: Diagnosis not present

## 2021-08-09 DIAGNOSIS — G3183 Dementia with Lewy bodies: Secondary | ICD-10-CM | POA: Diagnosis not present

## 2021-08-09 DIAGNOSIS — I4891 Unspecified atrial fibrillation: Secondary | ICD-10-CM | POA: Diagnosis not present

## 2021-08-10 DIAGNOSIS — E1110 Type 2 diabetes mellitus with ketoacidosis without coma: Secondary | ICD-10-CM | POA: Diagnosis not present

## 2021-08-10 DIAGNOSIS — G3183 Dementia with Lewy bodies: Secondary | ICD-10-CM | POA: Diagnosis not present

## 2021-08-10 DIAGNOSIS — I4891 Unspecified atrial fibrillation: Secondary | ICD-10-CM | POA: Diagnosis not present

## 2021-08-10 DIAGNOSIS — Z950 Presence of cardiac pacemaker: Secondary | ICD-10-CM | POA: Diagnosis not present

## 2021-08-11 DIAGNOSIS — I4891 Unspecified atrial fibrillation: Secondary | ICD-10-CM | POA: Diagnosis not present

## 2021-08-11 DIAGNOSIS — E1110 Type 2 diabetes mellitus with ketoacidosis without coma: Secondary | ICD-10-CM | POA: Diagnosis not present

## 2021-08-11 DIAGNOSIS — Z950 Presence of cardiac pacemaker: Secondary | ICD-10-CM | POA: Diagnosis not present

## 2021-08-11 DIAGNOSIS — G3183 Dementia with Lewy bodies: Secondary | ICD-10-CM | POA: Diagnosis not present

## 2021-08-19 DIAGNOSIS — J9601 Acute respiratory failure with hypoxia: Secondary | ICD-10-CM | POA: Diagnosis not present

## 2021-08-20 ENCOUNTER — Ambulatory Visit: Payer: No Typology Code available for payment source | Admitting: Sports Medicine

## 2021-12-19 DEATH — deceased
